# Patient Record
Sex: Male | Born: 1935
Health system: Southern US, Community
[De-identification: ages and names within clinical notes are randomized; demographics above are authoritative.]

## PROBLEM LIST (undated history)

## (undated) DIAGNOSIS — I509 Heart failure, unspecified: Secondary | ICD-10-CM

## (undated) DIAGNOSIS — K219 Gastro-esophageal reflux disease without esophagitis: Secondary | ICD-10-CM

## (undated) DIAGNOSIS — N200 Calculus of kidney: Secondary | ICD-10-CM

## (undated) DIAGNOSIS — Z87442 Personal history of urinary calculi: Secondary | ICD-10-CM

## (undated) DIAGNOSIS — N400 Enlarged prostate without lower urinary tract symptoms: Secondary | ICD-10-CM

## (undated) DIAGNOSIS — R011 Cardiac murmur, unspecified: Secondary | ICD-10-CM

## (undated) DIAGNOSIS — Z8669 Personal history of other diseases of the nervous system and sense organs: Secondary | ICD-10-CM

## (undated) DIAGNOSIS — I712 Thoracic aortic aneurysm, without rupture, unspecified: Secondary | ICD-10-CM

## (undated) DIAGNOSIS — R918 Other nonspecific abnormal finding of lung field: Secondary | ICD-10-CM

## (undated) DIAGNOSIS — I10 Essential (primary) hypertension: Secondary | ICD-10-CM

## (undated) DIAGNOSIS — T7840XA Allergy, unspecified, initial encounter: Secondary | ICD-10-CM

## (undated) DIAGNOSIS — I739 Peripheral vascular disease, unspecified: Secondary | ICD-10-CM

## (undated) DIAGNOSIS — I779 Disorder of arteries and arterioles, unspecified: Secondary | ICD-10-CM

## (undated) DIAGNOSIS — M1990 Unspecified osteoarthritis, unspecified site: Secondary | ICD-10-CM

## (undated) HISTORY — DX: Essential (primary) hypertension: I10

## (undated) HISTORY — PX: POLYPECTOMY: SHX149

## (undated) HISTORY — PX: INGUINAL HERNIA REPAIR: SUR1180

## (undated) HISTORY — PX: EXTRACORPOREAL SHOCK WAVE LITHOTRIPSY: SHX1557

## (undated) HISTORY — PX: COLONOSCOPY: SHX174

## (undated) HISTORY — DX: Allergy, unspecified, initial encounter: T78.40XA

## (undated) HISTORY — DX: Gastro-esophageal reflux disease without esophagitis: K21.9

## (undated) HISTORY — DX: Unspecified osteoarthritis, unspecified site: M19.90

---

## 2000-05-16 ENCOUNTER — Emergency Department (HOSPITAL_COMMUNITY): Admission: EM | Admit: 2000-05-16 | Discharge: 2000-05-16 | Payer: Self-pay | Admitting: Emergency Medicine

## 2000-05-17 ENCOUNTER — Encounter: Payer: Self-pay | Admitting: Emergency Medicine

## 2001-06-28 ENCOUNTER — Emergency Department (HOSPITAL_COMMUNITY): Admission: EM | Admit: 2001-06-28 | Discharge: 2001-06-28 | Payer: Self-pay | Admitting: Emergency Medicine

## 2003-07-08 ENCOUNTER — Encounter: Admission: RE | Admit: 2003-07-08 | Discharge: 2003-07-08 | Payer: Self-pay | Admitting: Family Medicine

## 2003-07-08 ENCOUNTER — Encounter: Payer: Self-pay | Admitting: Family Medicine

## 2004-10-14 ENCOUNTER — Ambulatory Visit: Payer: Self-pay | Admitting: Family Medicine

## 2004-11-13 ENCOUNTER — Ambulatory Visit: Payer: Self-pay | Admitting: Family Medicine

## 2005-04-20 ENCOUNTER — Ambulatory Visit: Payer: Self-pay | Admitting: Family Medicine

## 2005-04-29 ENCOUNTER — Ambulatory Visit: Payer: Self-pay | Admitting: Internal Medicine

## 2005-05-13 ENCOUNTER — Encounter (INDEPENDENT_AMBULATORY_CARE_PROVIDER_SITE_OTHER): Payer: Self-pay | Admitting: Specialist

## 2005-05-13 ENCOUNTER — Ambulatory Visit: Payer: Self-pay | Admitting: Internal Medicine

## 2005-11-15 HISTORY — PX: CATARACT EXTRACTION W/ INTRAOCULAR LENS  IMPLANT, BILATERAL: SHX1307

## 2006-05-03 ENCOUNTER — Ambulatory Visit: Payer: Self-pay | Admitting: Family Medicine

## 2006-05-03 LAB — CONVERTED CEMR LAB: PSA: 0.76 ng/mL

## 2006-06-08 ENCOUNTER — Ambulatory Visit: Payer: Self-pay | Admitting: Family Medicine

## 2007-03-21 ENCOUNTER — Encounter: Payer: Self-pay | Admitting: Family Medicine

## 2007-03-21 DIAGNOSIS — K219 Gastro-esophageal reflux disease without esophagitis: Secondary | ICD-10-CM | POA: Insufficient documentation

## 2007-03-21 DIAGNOSIS — J309 Allergic rhinitis, unspecified: Secondary | ICD-10-CM | POA: Insufficient documentation

## 2007-03-21 DIAGNOSIS — K469 Unspecified abdominal hernia without obstruction or gangrene: Secondary | ICD-10-CM | POA: Insufficient documentation

## 2007-03-21 DIAGNOSIS — I10 Essential (primary) hypertension: Secondary | ICD-10-CM | POA: Insufficient documentation

## 2007-03-21 DIAGNOSIS — G7000 Myasthenia gravis without (acute) exacerbation: Secondary | ICD-10-CM | POA: Insufficient documentation

## 2007-03-21 DIAGNOSIS — J45909 Unspecified asthma, uncomplicated: Secondary | ICD-10-CM | POA: Insufficient documentation

## 2007-03-21 DIAGNOSIS — G43809 Other migraine, not intractable, without status migrainosus: Secondary | ICD-10-CM | POA: Insufficient documentation

## 2007-05-10 ENCOUNTER — Ambulatory Visit: Payer: Self-pay | Admitting: Family Medicine

## 2007-05-10 LAB — CONVERTED CEMR LAB
ALT: 31 units/L (ref 0–53)
Alkaline Phosphatase: 59 units/L (ref 39–117)
BUN: 13 mg/dL (ref 6–23)
Bilirubin, Direct: 0.1 mg/dL (ref 0.0–0.3)
CO2: 34 meq/L — ABNORMAL HIGH (ref 19–32)
Calcium: 10.5 mg/dL (ref 8.4–10.5)
Cholesterol: 218 mg/dL (ref 0–200)
Direct LDL: 140.4 mg/dL
Eosinophils Absolute: 0.1 10*3/uL (ref 0.0–0.6)
GFR calc Af Amer: 85 mL/min
GFR calc non Af Amer: 71 mL/min
HDL: 42.9 mg/dL (ref >39.0)
Hemoglobin: 14 g/dL (ref 13.0–17.0)
Lymphocytes Relative: 29.2 % (ref 12.0–46.0)
MCHC: 34.1 g/dL (ref 30.0–36.0)
MCV: 88.4 fL (ref 78.0–100.0)
Monocytes Absolute: 0.4 10*3/uL (ref 0.2–0.7)
Monocytes Relative: 8.4 % (ref 3.0–11.0)
Neutro Abs: 3.3 10*3/uL (ref 1.4–7.7)
Platelets: 220 10*3/uL (ref 150–400)
Potassium: 4 meq/L (ref 3.5–5.1)
TSH: 1.62 microintl units/mL (ref 0.35–5.50)
Total Protein: 7.1 g/dL (ref 6.0–8.3)

## 2007-08-18 ENCOUNTER — Ambulatory Visit: Payer: Self-pay | Admitting: Family Medicine

## 2007-11-23 ENCOUNTER — Telehealth: Payer: Self-pay | Admitting: Family Medicine

## 2008-05-12 ENCOUNTER — Encounter: Payer: Self-pay | Admitting: Family Medicine

## 2008-05-13 ENCOUNTER — Ambulatory Visit: Payer: Self-pay | Admitting: Family Medicine

## 2008-05-13 LAB — CONVERTED CEMR LAB
AST: 32 units/L (ref 0–37)
Albumin: 4.1 g/dL (ref 3.5–5.2)
BUN: 16 mg/dL (ref 6–23)
Basophils Absolute: 0 10*3/uL (ref 0.0–0.1)
Basophils Relative: 0.5 % (ref 0.0–1.0)
Calcium: 10.3 mg/dL (ref 8.4–10.5)
Cholesterol: 181 mg/dL (ref 0–200)
Creatinine, Ser: 1 mg/dL (ref 0.4–1.5)
Eosinophils Absolute: 0.2 10*3/uL (ref 0.0–0.7)
Eosinophils Relative: 4.4 % (ref 0.0–5.0)
GFR calc Af Amer: 95 mL/min
GFR calc non Af Amer: 79 mL/min
HCT: 42.3 % (ref 39.0–52.0)
HDL: 37.4 mg/dL — ABNORMAL LOW (ref >39.0)
MCHC: 33.8 g/dL (ref 30.0–36.0)
MCV: 91.8 fL (ref 78.0–100.0)
Monocytes Absolute: 0.5 10*3/uL (ref 0.1–1.0)
PSA: 0.92 ng/mL (ref 0.10–4.00)
RBC: 4.6 M/uL (ref 4.22–5.81)
TSH: 1.47 microintl units/mL (ref 0.35–5.50)
Total Bilirubin: 0.8 mg/dL (ref 0.3–1.2)
VLDL: 17 mg/dL (ref 0–40)
WBC: 5.6 10*3/uL (ref 4.5–10.5)

## 2008-06-03 ENCOUNTER — Telehealth: Payer: Self-pay | Admitting: Internal Medicine

## 2008-06-17 ENCOUNTER — Ambulatory Visit (HOSPITAL_COMMUNITY): Admission: RE | Admit: 2008-06-17 | Discharge: 2008-06-17 | Payer: Self-pay | Admitting: Urology

## 2008-06-17 HISTORY — PX: OTHER SURGICAL HISTORY: SHX169

## 2008-08-28 ENCOUNTER — Ambulatory Visit: Payer: Self-pay | Admitting: Family Medicine

## 2008-11-12 ENCOUNTER — Telehealth: Payer: Self-pay | Admitting: Family Medicine

## 2008-11-20 ENCOUNTER — Ambulatory Visit: Payer: Self-pay | Admitting: Family Medicine

## 2008-11-20 DIAGNOSIS — M159 Polyosteoarthritis, unspecified: Secondary | ICD-10-CM | POA: Insufficient documentation

## 2009-01-17 ENCOUNTER — Telehealth: Payer: Self-pay | Admitting: Family Medicine

## 2009-04-15 ENCOUNTER — Telehealth: Payer: Self-pay | Admitting: Family Medicine

## 2009-05-13 ENCOUNTER — Ambulatory Visit: Payer: Self-pay | Admitting: Family Medicine

## 2009-05-13 LAB — CONVERTED CEMR LAB
AST: 32 units/L (ref 0–37)
BUN: 15 mg/dL (ref 6–23)
Bilirubin, Direct: 0.1 mg/dL (ref 0.0–0.3)
Cholesterol: 191 mg/dL (ref 0–200)
Eosinophils Absolute: 0.2 10*3/uL (ref 0.0–0.7)
Eosinophils Relative: 2.5 % (ref 0.0–5.0)
GFR calc non Af Amer: 84.71 mL/min (ref >60)
LDL Cholesterol: 127 mg/dL — ABNORMAL HIGH (ref 0–99)
MCHC: 34.1 g/dL (ref 30.0–36.0)
MCV: 92.1 fL (ref 78.0–100.0)
Monocytes Absolute: 0.6 10*3/uL (ref 0.1–1.0)
Neutrophils Relative %: 54.7 % (ref 43.0–77.0)
PSA: 1.18 ng/mL (ref 0.10–4.00)
Platelets: 210 10*3/uL (ref 150.0–400.0)
Potassium: 3.7 meq/L (ref 3.5–5.1)
Sodium: 144 meq/L (ref 135–145)
Total Bilirubin: 0.8 mg/dL (ref 0.3–1.2)
Total CHOL/HDL Ratio: 5
VLDL: 25.2 mg/dL (ref 0.0–40.0)
WBC: 6.7 10*3/uL (ref 4.5–10.5)

## 2009-05-15 ENCOUNTER — Encounter: Payer: Self-pay | Admitting: Family Medicine

## 2009-05-16 ENCOUNTER — Ambulatory Visit: Payer: Self-pay | Admitting: Family Medicine

## 2009-05-16 DIAGNOSIS — N529 Male erectile dysfunction, unspecified: Secondary | ICD-10-CM | POA: Insufficient documentation

## 2009-05-20 ENCOUNTER — Telehealth: Payer: Self-pay | Admitting: *Deleted

## 2009-06-03 ENCOUNTER — Ambulatory Visit: Payer: Self-pay | Admitting: Family Medicine

## 2009-09-08 ENCOUNTER — Ambulatory Visit: Payer: Self-pay | Admitting: Family Medicine

## 2009-10-29 ENCOUNTER — Encounter: Payer: Self-pay | Admitting: Family Medicine

## 2009-10-29 ENCOUNTER — Telehealth: Payer: Self-pay | Admitting: Family Medicine

## 2010-01-26 ENCOUNTER — Telehealth: Payer: Self-pay | Admitting: Family Medicine

## 2010-02-03 ENCOUNTER — Telehealth: Payer: Self-pay | Admitting: Family Medicine

## 2010-02-23 ENCOUNTER — Ambulatory Visit (HOSPITAL_COMMUNITY): Admission: RE | Admit: 2010-02-23 | Discharge: 2010-02-23 | Payer: Self-pay | Admitting: Urology

## 2010-03-02 ENCOUNTER — Telehealth: Payer: Self-pay | Admitting: Family Medicine

## 2010-04-01 ENCOUNTER — Telehealth: Payer: Self-pay | Admitting: Family Medicine

## 2010-04-28 ENCOUNTER — Encounter (INDEPENDENT_AMBULATORY_CARE_PROVIDER_SITE_OTHER): Payer: Self-pay | Admitting: *Deleted

## 2010-04-30 ENCOUNTER — Encounter (INDEPENDENT_AMBULATORY_CARE_PROVIDER_SITE_OTHER): Payer: Self-pay | Admitting: *Deleted

## 2010-05-04 ENCOUNTER — Encounter (INDEPENDENT_AMBULATORY_CARE_PROVIDER_SITE_OTHER): Payer: Self-pay | Admitting: *Deleted

## 2010-05-05 ENCOUNTER — Ambulatory Visit: Payer: Self-pay | Admitting: Internal Medicine

## 2010-05-06 ENCOUNTER — Telehealth: Payer: Self-pay | Admitting: Internal Medicine

## 2010-05-11 ENCOUNTER — Telehealth: Payer: Self-pay | Admitting: Internal Medicine

## 2010-05-14 ENCOUNTER — Ambulatory Visit: Payer: Self-pay | Admitting: Internal Medicine

## 2010-05-14 HISTORY — PX: COLONOSCOPY W/ POLYPECTOMY: SHX1380

## 2010-05-14 LAB — HM COLONOSCOPY

## 2010-05-26 ENCOUNTER — Ambulatory Visit: Payer: Self-pay | Admitting: Family Medicine

## 2010-06-23 ENCOUNTER — Telehealth: Payer: Self-pay | Admitting: Family Medicine

## 2010-08-19 ENCOUNTER — Telehealth (INDEPENDENT_AMBULATORY_CARE_PROVIDER_SITE_OTHER): Payer: Self-pay | Admitting: *Deleted

## 2010-08-20 ENCOUNTER — Ambulatory Visit: Payer: Self-pay | Admitting: Family Medicine

## 2010-08-21 ENCOUNTER — Ambulatory Visit: Payer: Self-pay | Admitting: Family Medicine

## 2010-08-21 LAB — CONVERTED CEMR LAB: Phosphorus: 2.6 mg/dL (ref 2.3–4.6)

## 2010-09-01 ENCOUNTER — Telehealth: Payer: Self-pay | Admitting: Family Medicine

## 2010-09-28 ENCOUNTER — Ambulatory Visit (HOSPITAL_COMMUNITY): Admission: RE | Admit: 2010-09-28 | Discharge: 2010-09-28 | Payer: Self-pay | Admitting: Urology

## 2010-10-21 ENCOUNTER — Telehealth: Payer: Self-pay | Admitting: Family Medicine

## 2010-10-23 ENCOUNTER — Ambulatory Visit: Payer: Self-pay | Admitting: Family Medicine

## 2010-12-13 LAB — CONVERTED CEMR LAB
AST: 31 units/L (ref 0–37)
Albumin: 4.2 g/dL (ref 3.5–5.2)
Basophils Relative: 0.5 % (ref 0.0–3.0)
Calcium: 10.9 mg/dL — ABNORMAL HIGH (ref 8.4–10.5)
Creatinine, Ser: 1.2 mg/dL (ref 0.4–1.5)
Eosinophils Absolute: 0.2 10*3/uL (ref 0.0–0.7)
Eosinophils Relative: 2.4 % (ref 0.0–5.0)
GFR calc non Af Amer: 78.66 mL/min (ref >60)
Glucose, Urine, Semiquant: NEGATIVE
HDL: 36.3 mg/dL — ABNORMAL LOW (ref >39.00)
Lymphocytes Relative: 28.6 % (ref 12.0–46.0)
MCHC: 34.4 g/dL (ref 30.0–36.0)
Neutrophils Relative %: 59.2 % (ref 43.0–77.0)
RBC: 4.58 M/uL (ref 4.22–5.81)
Specific Gravity, Urine: >=1.03
TSH: 1.1 microintl units/mL (ref 0.35–5.50)
Total CHOL/HDL Ratio: 5
Triglycerides: 173 mg/dL — ABNORMAL HIGH (ref 0.0–149.0)
WBC Urine, dipstick: NEGATIVE
WBC: 6.8 10*3/uL (ref 4.5–10.5)
pH: 5

## 2010-12-17 NOTE — Progress Notes (Signed)
Summary: Lab Appointment/ Flu shot  Phone Note Call from Patient   Complaint: Chest Pain Summary of Call: Pt was informed by Dr. Tawanna Cooler to call [facility] for apt to have Serum Calium test done in October. Call back on cell number [phone] Initial call taken by: Kathrynn Speed CMA,  August 19, 2010 10:38 AM  Follow-up for Phone Call        scheduled lab apt and will be getting flu shot after lab tests are done Follow-up by: Kathrynn Speed CMA,  August 19, 2010 11:24 AM

## 2010-12-17 NOTE — Progress Notes (Signed)
Summary: REFILL  Phone Note Refill Request Message from:  Fax from Pharmacy  Refills Requested: Medication #1:  HYDROCHLOROTHIAZIDE 25 MG TABS 1 tablet by mouth every morning [facility] FAX---[phone]  Initial call taken by: Warnell Forester,  January 26, 2010 9:44 AM    Prescriptions: HYDROCHLOROTHIAZIDE 25 MG TABS (HYDROCHLOROTHIAZIDE) 1 tablet by mouth every morning  #100 x 2   Entered by:   Kern Reap CMA (AAMA)   Authorized by:   Roderick Pee MD   Signed by:   Kern Reap CMA (AAMA) on 01/26/2010   Method used:   [facility]        [facility] [phone]* (retail)       [address]       [place], [place]  [address]       Ph: [phone] or [phone]       Fax: [phone]   RxID:   [id number]

## 2010-12-17 NOTE — Procedures (Signed)
Summary: Colonoscopy  Patient: [name] Note: All result statuses are Final unless otherwise noted.  Tests: (1) Colonoscopy (COL)   COL Colonoscopy           DONE     [facility]     [address]     [place], [place]  [address]           COLONOSCOPY PROCEDURE REPORT           PATIENT:  [name], [name]  MR#:  [id number]     BIRTHDATE:  1935/05/23, 74 yrs. old  GENDER:  male     ENDOSCOPIST:  Wilhemina Bonito. Eda Keys, MD     REF. BY:  Surveillance Program Recall,     PROCEDURE DATE:  05/14/2010     PROCEDURE:  Colonoscopy with snare polypectomy x 1     ASA CLASS:  Class II     INDICATIONS:  history of pre-cancerous (adenomatous) colon polyps     ; index 04-2005 w/ small adenomas     MEDICATIONS:   Fentanyl 50 IV, Versed 7 mg IV           DESCRIPTION OF PROCEDURE:   After the risks benefits and     alternatives of the procedure were thoroughly explained, informed     consent was obtained.  Digital rectal exam was performed and     revealed no abnormalities.   The [id number] endoscope was     introduced through the anus and advanced to the cecum, which was     identified by both the appendix and ileocecal valve, without     limitations. Time to cecum = 7:38 min. The quality of the prep was     excellent, using MoviPrep.  The instrument was then slowly     withdrawn (time = 9:59 min) as the colon was fully examined.     <<PROCEDUREIMAGES>>           FINDINGS:  A diminutive polyp was found in the ascending colon.     Polyp was snared without cautery. Retrieval was successful. snare     polyp  This was otherwise a normal examination of the colon.     Retroflexed views in the rectum revealed internal hemorrhoids.     The scope was then withdrawn from the patient and the procedure     completed.           COMPLICATIONS:  None     ENDOSCOPIC IMPRESSION:     1) Diminutive polyp in the ascending colon - removed     2) Otherwise normal examination     3) Internal  hemorrhoids           RECOMMENDATIONS:     1) Follow up colonoscopy in 5 years     ______________________________     Wilhemina Bonito. Eda Keys, MD           CC:  The Patient; Roderick Pee, MD           n.     Rosalie DoctorWilhemina Bonito. Eda Keys at 05/14/2010 12:43 PM           Abran Cantor, [phone]  Note: An exclamation mark (!) indicates a result that was not dispersed into the flowsheet. Document Creation Date: 05/14/2010 12:44 PM _______________________________________________________________________  (1) Order result status: Final Collection or observation date-time: 05/14/2010 12:38 Requested date-time:  Receipt date-time:  Reported date-time:  Referring Physician:   Ordering Physician: Jonny Ruiz  Eda Keys [phone]) Specimen Source:  Source: Launa Grill Order Number: [id number] Lab site:

## 2010-12-17 NOTE — Progress Notes (Signed)
Summary: singular refill  Phone Note Refill Request Message from:  Fax from Pharmacy on March 02, 2010 12:24 PM  Refills Requested: Medication #1:  SINGULAIR 10 MG TABS Take 1 tablet by mouth once a day Initial call taken by: Kern Reap CMA Duncan Dull),  March 02, 2010 12:24 PM    Prescriptions: SINGULAIR 10 MG TABS (MONTELUKAST SODIUM) Take 1 tablet by mouth once a day  #100 x 1   Entered by:   Kern Reap CMA (AAMA)   Authorized by:   Roderick Pee MD   Signed by:   Kern Reap CMA (AAMA) on 03/02/2010   Method used:   Electronically to        [facility] (mail-order)             ,          Ph: [phone]       Fax: [phone]   RxID:   [id number]

## 2010-12-17 NOTE — Progress Notes (Signed)
Summary: triage / loose stools / upcoming colonoscopy   Phone Note Call from Patient Call back at Home Phone [phone]   Caller: Patient Call For: Marina Goodell Reason for Call: Talk to Nurse Summary of Call: Patient would like to speak to nurse because he is scheduled for a procedue on Thurs. but is having some stomach pains , wants to know if he can still have procedure or does he need to see Dr Marina Goodell first. Initial call taken by: Tawni Levy,  May 11, 2010 8:47 AM  Follow-up for Phone Call        Pt started having intermittent cramping with loose stools on Saturday evening.Bowel movements x4 on Sat,x2 Sunday and 1 so far today.No blood or mucus seen. No chills,fever or other G.I. symptoms.Has happened a few times before.Wants to know if he needs seen or go ahead with colon as scheduled this week? Follow-up by: Teryl Lucy RN,  May 11, 2010 9:09 AM  Additional Follow-up for Phone Call Additional follow up Details #1::        go ahead with colonoscopy as planned. Also, pull his chart for my review. I don't see old GI records in the EMR Additional Follow-up by: Hilarie Fredrickson MD,  May 11, 2010 9:21 AM    Additional Follow-up for Phone Call Additional follow up Details #2::    Pt. ntfd. of Dr.Perry's response to keep colon appt. Follow-up by: Teryl Lucy RN,  May 11, 2010 9:37 AM

## 2010-12-17 NOTE — Miscellaneous (Signed)
Summary: previsit.RM  Clinical Lists Changes  Medications: Added new medication of MOVIPREP 100 GM  SOLR (PEG-KCL-NACL-NASULF-NA ASC-C) As per prep instructions. - Signed Rx of MOVIPREP 100 GM  SOLR (PEG-KCL-NACL-NASULF-NA ASC-C) As per prep instructions.;  #1 x 0;  Signed;  Entered by: Sherren Kerns RN;  Authorized by: Hilarie Fredrickson MD;  Method used: [facility] [phone] [address], [place], [place]  [address], Ph: [phone] or [phone], Fax: [phone] Observations: Added new observation of ALLERGY REV: Done (05/05/2010 13:45)    Prescriptions: MOVIPREP 100 GM  SOLR (PEG-KCL-NACL-NASULF-NA ASC-C) As per prep instructions.  #1 x 0   Entered by:   Sherren Kerns RN   Authorized by:   Hilarie Fredrickson MD   Signed by:   Sherren Kerns RN on 05/05/2010   Method used:   [facility] to        [facility] [phone][facility] (retail)       [address]       [place], [place]  [address]       Ph: [phone] or [phone]       Fax: [phone]   RxID:   [id number]

## 2010-12-17 NOTE — Progress Notes (Signed)
Summary: labs scheduled  Phone Note Call from Patient Call back at Home Phone [phone]   Caller: Patient Call For: Roderick Pee MD Reason for Call: Talk to Nurse Summary of Call: patient is calling to schedule lab work. Initial call taken by: Kern Reap CMA Duncan Dull),  October 21, 2010 1:47 PM  Follow-up for Phone Call        Called pt and appt for labs (Calcium, PTH level and a serum phosphorus) was scheduled for 10/23/10.  Follow-up by: Debbra Riding,  October 21, 2010 2:01 PM

## 2010-12-17 NOTE — Progress Notes (Signed)
Summary: refill  Phone Note Call from Patient Call back at Home Phone [phone]   Caller: Patient----live call Reason for Call: Refill Medication Summary of Call: Please send his rx for HCTZ to [facility]. Initial call taken by: Warnell Forester,  February 03, 2010 9:55 AM    Prescriptions: HYDROCHLOROTHIAZIDE 25 MG TABS (HYDROCHLOROTHIAZIDE) 1 tablet by mouth every morning  #100 x 2   Entered by:   Kern Reap CMA (AAMA)   Authorized by:   Roderick Pee MD   Signed by:   Kern Reap CMA (AAMA) on 02/03/2010   Method used:   Electronically to        [facility]* (mail-order)             ,          Ph: [phone]       Fax: [phone]   RxID:   [id number]

## 2010-12-17 NOTE — Letter (Signed)
Summary: [facility] Instructions  [facility]  [address] [place], [place] [address]   Phone: [phone]  Fax: [phone]       [name]    March 09, 1935    MRN: [id number]        Procedure Day /Date:  05/14/10  Thursday     Arrival Time:  10:30am      Procedure Time:  11:30am     Location of Procedure:                    _x _  [facility] (4th Floor)                        PREPARATION FOR COLONOSCOPY WITH MOVIPREP   Starting 5 days prior to your procedure _6/25/11 _ do not eat nuts, seeds, popcorn, corn, beans, peas,  salads, or any raw vegetables.  Do not take any fiber supplements (e.g. Metamucil, Citrucel, and Benefiber).  THE DAY BEFORE YOUR PROCEDURE         DATE:  05/13/10  DAY:  Wednesday  1.  Drink clear liquids the entire day-NO SOLID FOOD  2.  Do not drink anything colored red or purple.  Avoid juices with pulp.  No orange juice.  3.  Drink at least 64 oz. (8 glasses) of fluid/clear liquids during the day to prevent dehydration and help the prep work efficiently.  CLEAR LIQUIDS INCLUDE: Water Jello Ice Popsicles Tea (sugar ok, no milk/cream) Powdered fruit flavored drinks Coffee (sugar ok, no milk/cream) Gatorade Juice: apple, white grape, white cranberry  Lemonade Clear bullion, consomm, broth Carbonated beverages (any kind) Strained chicken noodle soup Hard Candy                             4.  In the morning, mix first dose of MoviPrep solution:    Empty 1 Pouch A and 1 Pouch B into the disposable container    Add lukewarm drinking water to the top line of the container. Mix to dissolve    Refrigerate (mixed solution should be used within 24 hrs)  5.  Begin drinking the prep at 5:00 p.m. The MoviPrep container is divided by 4 marks.   Every 15 minutes drink the solution down to the next mark (approximately 8 oz) until the full liter is complete.   6.  Follow completed prep with 16 oz of clear liquid of your  choice (Nothing red or purple).  Continue to drink clear liquids until bedtime.  7.  Before going to bed, mix second dose of MoviPrep solution:    Empty 1 Pouch A and 1 Pouch B into the disposable container    Add lukewarm drinking water to the top line of the container. Mix to dissolve    Refrigerate  THE DAY OF YOUR PROCEDURE      DATE:  05/14/10 DAY:  Thursday  Beginning at  6:30 a.m. (5 hours before procedure):         1. Every 15 minutes, drink the solution down to the next mark (approx 8 oz) until the full liter is complete.  2. Follow completed prep with 16 oz. of clear liquid of your choice.    3. You may drink clear liquids until 9:30am   (2 HOURS BEFORE PROCEDURE).   MEDICATION INSTRUCTIONS  Unless otherwise instructed, you should take regular prescription medications with a small sip of  water   as early as possible the morning of your procedure.   Additional medication instructions:  hold HCTZ morning of procedure!         OTHER INSTRUCTIONS  You will need a responsible adult at least 74 years of age to accompany you and drive you home.   This person must remain in the waiting room during your procedure.  Wear loose fitting clothing that is easily removed.  Leave jewelry and other valuables at home.  However, you may wish to bring a book to read or  an iPod/MP3 player to listen to music as you wait for your procedure to start.  Remove all body piercing jewelry and leave at home.  Total time from sign-in until discharge is approximately 2-3 hours.  You should go home directly after your procedure and rest.  You can resume normal activities the  day after your procedure.  The day of your procedure you should not:   Drive   Make legal decisions   Operate machinery   Drink alcohol   Return to work  You will receive specific instructions about eating, activities and medications before you leave.    The above instructions have been reviewed and  explained to me by  Sherren Kerns RN  May 05, 2010 2:38 PM     I fully understand and can verbalize these instructions _____________________________ Date _________

## 2010-12-17 NOTE — Assessment & Plan Note (Signed)
Summary: emp-will fast//ccm   Vital Signs:  Patient profile:   76 year old male Height:      63.75 inches Weight:      164 pounds BMI:     28.47 Temp:     98.4 degrees F oral BP sitting:   160 / 90  (left arm) Cuff size:   regular  Vitals Entered By: Kern Reap CMA Duncan Dull) (May 26, 2010 8:26 AM) CC: cpx   CC:  cpx.  History of Present Illness: [name] is a 76 year old, married male, nonsmoker, who comes in today for annual physical examination because of underlying asthma myasthenia gravis, hypertension.  His asthma history with Flovent 44, 2 puffs b.i.d., and albuterol p.r.n. and Singulair 10 mg daily.  He's had a good year.  No major asthma attacks.   Zestril 20 mg daily, hydrochlorothiazide 25 mg..... BP at home.  This morning, 127/73.  His myasthenia is quiet.  We did write him some Viagra per ED however, his wife is not sexually interested anymore  He gets routine eye care..bilateral cataracts, and lens implants, Dr. Vonna Kotyk...Marland KitchenMarland KitchenMarland Kitchen  Dental care.  Colonoscopy done a couple months ago, normal, tetanus, 2009, flu, 2010, Pneumovax 2006, shingles 2010. Here for Medicare AWV:  1.   Risk factors based on Past M, S, F history:........no change 2.   Physical Activities: .........Marland Kitchenwalks daily 3.   Depression/mood: ........mood good.  No depression 4.   Hearing: ..........normal 5.   ADL's: ........normal 6.   Fall Risk: ...Marland KitchenMarland KitchenMarland Kitchenreviewed 7.   Home Safety: .........Marland Kitchenreviewed 8.   Height, weight, &visual acuity:..height weight, normal annual eye exam by ophthalmologist 9.   Counseling:...Marland KitchenMarland KitchenAllergies:  problems, stable  10.   Labs ordered based on risk factors: ...labs done 11.           Referral Coordination.none 12.           Care Plan.......Marland Kitchenoverall plan reviewed 13.            Cognitive Assessment ........normal mentation  Allergies (verified): No Known Drug Allergies  Past History:  Past medical, surgical, family and social histories (including risk factors) reviewed, and  no changes noted (except as noted below).  Past Medical History: Reviewed history from 11/20/2008 and no changes required. Allergic rhinitis Asthma GERD Hypertension myasthenia gravis bilateral cataracts with lens implants kidney stones bilateral hernia repair Osteoarthritis  Family History: Reviewed history from 05/13/2008 and no changes required. father died 41, congestive heart failure, hypertension mother died 66, congestive heart failure, hypertension, chronic renal failure one brother has cardiomegaly 4 sisters, one died of a congenital cardiac defect, when his hypertension, renal artery stenosis.  The other to also have hypertension  Social History: Reviewed history from 05/13/2008 and no changes required. Retired Married Never Smoked Alcohol use-no Drug use-no Regular exercise-yes  Review of Systems      See HPI  Physical Exam  General:  Well-developed,well-nourished,in no acute distress; alert,appropriate and cooperative throughout examination Head:  Normocephalic and atraumatic without obvious abnormalities. No apparent alopecia or balding. Eyes:  No corneal or conjunctival inflammation noted. EOMI. Perrla. Funduscopic exam benign, without hemorrhages, exudates or papilledema. Vision grossly normal. Ears:  External ear exam shows no significant lesions or deformities.  Otoscopic examination reveals clear canals, tympanic membranes are intact bilaterally without bulging, retraction, inflammation or discharge. Hearing is grossly normal bilaterally. Nose:  External nasal examination shows no deformity or inflammation. Nasal mucosa are pink and moist without lesions or exudates. Mouth:  Oral mucosa and oropharynx without lesions or exudates.  Teeth in good repair. Neck:  No deformities, masses, or tenderness noted. Chest Wall:  No deformities, masses, tenderness or gynecomastia noted. Breasts:  No masses or gynecomastia noted Lungs:  Normal respiratory effort, chest  expands symmetrically. Lungs are clear to auscultation, no crackles or wheezes. Heart:  Normal rate and regular rhythm. S1 and S2 normal without gallop, murmur, click, rub or other extra sounds. Abdomen:  Bowel sounds positive,abdomen soft and non-tender without masses, organomegaly or hernias noted. Rectal:  No external abnormalities noted. Normal sphincter tone. No rectal masses or tenderness. Genitalia:  Testes bilaterally descended without nodularity, tenderness or masses. No scrotal masses or lesions. No penis lesions or urethral discharge. Prostate:  Prostate gland firm and smooth, no enlargement, nodularity, tenderness, mass, asymmetry or induration. Msk:  No deformity or scoliosis noted of thoracic or lumbar spine.   Pulses:  R and L carotid,radial,femoral,dorsalis pedis and posterior tibial pulses are full and equal bilaterally Extremities:  No clubbing, cyanosis, edema, or deformity noted with normal full range of motion of all joints.   Neurologic:  No cranial nerve deficits noted. Station and gait are normal. Plantar reflexes are down-going bilaterally. DTRs are symmetrical throughout. Sensory, motor and coordinative functions appear intact. Skin:  Intact without suspicious lesions or rashes Cervical Nodes:  No lymphadenopathy noted Axillary Nodes:  No palpable lymphadenopathy Inguinal Nodes:  No significant adenopathy Psych:  Cognition and judgment appear intact. Alert and cooperative with normal attention span and concentration. No apparent delusions, illusions, hallucinations   Impression & Recommendations:  Problem # 1:  MYASTHENIA GRAVIS W/O (ACUTE) EXACERBATION (ICD-358.00) Assessment Unchanged  Orders: Venipuncture (56387) Prescription Created Electronically [phone]) First annual wellness visit with prevention plan  (I9518) UA Dipstick w/o Micro (automated)  (81003) TLB-BMP (Basic Metabolic Panel-BMET) (80048-METABOL) TLB-CBC Platelet - w/Differential  (85025-CBCD) TLB-Lipid Panel (80061-LIPID) TLB-Hepatic/Liver Function Pnl (80076-HEPATIC) TLB-TSH (Thyroid Stimulating Hormone) (84443-TSH) TLB-PSA (Prostate Specific Antigen) (84153-PSA)  Problem # 2:  HYPERTENSION (ICD-401.9) Assessment: Improved  His updated medication list for this problem includes:    Hydrochlorothiazide 25 Mg Tabs (Hydrochlorothiazide) .Marland Kitchen... 1 tablet by mouth every morning    Zestril 20 Mg Tabs (Lisinopril) .Marland Kitchen... Take 1 tablet by mouth every morning  Orders: Venipuncture (84166) Prescription Created Electronically [phone]) First annual wellness visit with prevention plan  (S0109) UA Dipstick w/o Micro (automated)  (81003) EKG w/ Interpretation (93000) TLB-BMP (Basic Metabolic Panel-BMET) (80048-METABOL) TLB-CBC Platelet - w/Differential (85025-CBCD) TLB-Lipid Panel (80061-LIPID) TLB-Hepatic/Liver Function Pnl (80076-HEPATIC) TLB-TSH (Thyroid Stimulating Hormone) (84443-TSH) TLB-PSA (Prostate Specific Antigen) (84153-PSA)  Problem # 3:  ASTHMA (ICD-493.90) Assessment: Improved  His updated medication list for this problem includes:    Albuterol 90 Mcg/act Aers (Albuterol) .Marland Kitchen... 2 ps qid as needed    Flovent Hfa 44 Mcg/act Aero (Fluticasone propionate  hfa) ..... Inhale 2 puff twice a day    Zafirlukast 10 Mg Tabs (Zafirlukast) ..... One daily  Orders: Venipuncture (32355) Prescription Created Electronically [phone]) First annual wellness visit with prevention plan  (U5427) UA Dipstick w/o Micro (automated)  (81003) TLB-BMP (Basic Metabolic Panel-BMET) (80048-METABOL) TLB-CBC Platelet - w/Differential (85025-CBCD) TLB-Lipid Panel (80061-LIPID) TLB-Hepatic/Liver Function Pnl (80076-HEPATIC) TLB-TSH (Thyroid Stimulating Hormone) (84443-TSH) TLB-PSA (Prostate Specific Antigen) (84153-PSA)  Problem # 4:  ALLERGIC RHINITIS (ICD-477.9) Assessment: Improved  Orders: Venipuncture (06237) Prescription Created Electronically [phone]) First annual wellness  visit with prevention plan  (D1761) UA Dipstick w/o Micro (automated)  (81003) TLB-BMP (Basic Metabolic Panel-BMET) (80048-METABOL) TLB-CBC Platelet - w/Differential (85025-CBCD) TLB-Lipid Panel (80061-LIPID) TLB-Hepatic/Liver Function Pnl (80076-HEPATIC) TLB-TSH (Thyroid Stimulating Hormone) (84443-TSH)  TLB-PSA (Prostate Specific Antigen) (84153-PSA)  Complete Medication List: 1)  Albuterol 90 Mcg/act Aers (Albuterol) .... 2 ps qid as needed 2)  Flovent Hfa 44 Mcg/act Aero (Fluticasone propionate  hfa) .... Inhale 2 puff twice a day 3)  Hydrochlorothiazide 25 Mg Tabs (Hydrochlorothiazide) .Marland Kitchen.. 1 tablet by mouth every morning 4)  Zafirlukast 10 Mg Tabs (Zafirlukast) .... One daily 5)  Adult Aspirin Ec Low Strength 81 Mg Tbec (Aspirin) .... Once daily 6)  Fish Oil 1200 Mg Caps (Omega-3 fatty acids) .... Once daily 7)  [facility] Ibuprofen 200 Mg Caps (Ibuprofen) .... Two times a day 8)  Multivitamins Tabs (Multiple vitamin) .... Once daily 9)  Zestril 20 Mg Tabs (Lisinopril) .... Take 1 tablet by mouth every morning  Patient Instructions: 1)  continue your current treatment program 2)  Please schedule a follow-up appointment in 1 year. Prescriptions: ZESTRIL 20 MG TABS (LISINOPRIL) Take 1 tablet by mouth every morning  #100 x 3   Entered and Authorized by:   Roderick Pee MD   Signed by:   Roderick Pee MD on 05/26/2010   Method used:   Faxed to ...       [facility] (mail-order)             , [place]         Ph: [phone]       Fax: [phone]   RxID:   [id number] ZAFIRLUKAST 10 MG TABS (ZAFIRLUKAST) One daily  #100 x 3   Entered and Authorized by:   Roderick Pee MD   Signed by:   Roderick Pee MD on 05/26/2010   Method used:   Faxed to ...       [facility] (mail-order)             , [place]         Ph: [phone]       Fax: [phone]   RxID:   [id number] HYDROCHLOROTHIAZIDE 25 MG TABS (HYDROCHLOROTHIAZIDE) 1 tablet by mouth every morning  #100 x 3   Entered and Authorized  by:   Roderick Pee MD   Signed by:   Roderick Pee MD on 05/26/2010   Method used:   Faxed to ...       [facility] (mail-order)             , [place]         Ph: [phone]       Fax: [phone]   RxID:   [id number] FLOVENT HFA 44 MCG/ACT AERO (FLUTICASONE PROPIONATE  HFA) Inhale 2 puff twice a day  #3 x 4   Entered and Authorized by:   Roderick Pee MD   Signed by:   Roderick Pee MD on 05/26/2010   Method used:   Faxed to ...       [facility] (mail-order)             , [place]         Ph: [phone]       Fax: [phone]   RxID:   [id number] ALBUTEROL 90 MCG/ACT AERS (ALBUTEROL) 2 ps qid as needed  #2 x 2   Entered and Authorized by:   Roderick Pee MD   Signed by:   Roderick Pee MD on 05/26/2010   Method used:   Faxed to ...       [facility] (mail-order)             ,  [place]         Ph: [phone]       Fax: [phone]   RxID:   [id number]     Laboratory Results   Urine Tests    Routine Urinalysis   Color: yellow Appearance: Clear Glucose: negative   (Normal Range: Negative) Bilirubin: negative   (Normal Range: Negative) Ketone: trace (5)   (Normal Range: Negative) Spec. Gravity: >=1.030   (Normal Range: 1.003-1.035) Blood: trace-lysed   (Normal Range: Negative) pH: 5.0   (Normal Range: 5.0-8.0) Protein: negative   (Normal Range: Negative) Urobilinogen: 0.2   (Normal Range: 0-1) Nitrite: negative   (Normal Range: Negative) Leukocyte Esterace: negative   (Normal Range: Negative)    Comments: Rita Ohara  May 26, 2010 10:35 AM

## 2010-12-17 NOTE — Progress Notes (Signed)
Summary: Moviprep too expensive   Phone Note Call from Patient Call back at [phone]   Caller: Patient Call For: Dr. Marina Goodell Reason for Call: Talk to Nurse Summary of Call: Moviprep is too expensive...would like an alternative med. Initial call taken by: Karna Christmas,  May 06, 2010 11:43 AM  Follow-up for Phone Call        Moviprep rebate coupon sent to pt. Follow-up by: Wyona Almas RN,  May 06, 2010 12:20 PM

## 2010-12-17 NOTE — Progress Notes (Signed)
Summary: zafirlukast request  Phone Note Call from Patient   Summary of Call: Insurance has suggested he try zafirlukast instead of singulair to save money.  He requests it be called to [facility].  He has CPX Aug.   Initial call taken by: Rudy Jew, RN,  Apr 01, 2010 8:23 AM  Follow-up for Phone Call        ok.Marland KitchenMarland KitchenMarland Kitchen#100 directions one daily refills x 3 Follow-up by: Roderick Pee MD,  Apr 01, 2010 9:20 AM    New/Updated Medications: ZAFIRLUKAST 10 MG TABS (ZAFIRLUKAST) One daily Prescriptions: ZAFIRLUKAST 10 MG TABS (ZAFIRLUKAST) One daily  #100 x 3   Entered by:   Rudy Jew, RN   Authorized by:   Roderick Pee MD   Signed by:   Rudy Jew, RN on 04/01/2010   Method used:   Electronically to        [facility] [phone]* (retail)       [address]       [place], [place]  [address]       Ph: [phone] or [phone]       Fax: [phone]   RxID:   [id number]

## 2010-12-17 NOTE — Letter (Signed)
Summary: Previsit letter  [facility]  [address] [place], [place] [address]   Phone: [phone]  Fax: [phone]       04/30/2010 MRN: [id number]  [name] [address] [place], [place]  [address]  Dear [name],  Welcome to the Gastroenterology Division at [facility].    You are scheduled to see a nurse for your pre-procedure visit on 05-05-10 at 2:00p.m. on the 3rd floor at [facility] [address]  We ask that you try to arrive at our office 15 minutes prior to your appointment time to allow for check-in.  Your nurse visit will consist of discussing your medical and surgical history, your immediate family medical history, and your medications.    Please bring a complete list of all your medications or, if you prefer, bring the medication bottles and we will list them.  We will need to be aware of both prescribed and over the counter drugs.  We will need to know exact dosage information as well.  If you are on blood thinners (Coumadin, Plavix, Aggrenox, Ticlid, etc.) please call our office today/prior to your appointment, as we need to consult with your physician about holding your medication.   Please be prepared to read and sign documents such as consent forms, a financial agreement, and acknowledgement forms.  If necessary, and with your consent, a friend or relative is welcome to sit-in on the nurse visit with you.  Please bring your insurance card so that we may make a copy of it.  If your insurance requires a referral to see a specialist, please bring your referral form from your primary care physician.  No co-pay is required for this nurse visit.     If you cannot keep your appointment, please call [phone] to cancel or reschedule prior to your appointment date.  This allows [place] the opportunity to schedule an appointment for another patient in need of care.    Thank you for choosing [facility] for your medical  needs.  We appreciate the opportunity to care for you.  Please visit [place] at our website  to learn more about our practice.                     Sincerely.                                                                                                                   The Gastroenterology Division

## 2010-12-17 NOTE — Letter (Signed)
Summary: Colonoscopy Letter  [facility]  [address] [place], [place] [address]   Phone: [phone]  Fax: [phone]      April 28, 2010 MRN: [id number]   [name] [address] [place], [place]  [address]   Dear [name],   According to your medical record, it is time for you to schedule a Colonoscopy. The American Cancer Society recommends this procedure as a method to detect early colon cancer. Patients with a family history of colon cancer, or a personal history of colon polyps or inflammatory bowel disease are at increased risk.  This letter has been generated based on the recommendations made at the time of your procedure. If you feel that in your particular situation this may no longer apply, please contact our office.  Please call our office at [phone] to schedule this appointment or to update your records at your earliest convenience.  Thank you for cooperating with [place] to provide you with the very best care possible.   Sincerely,  Wilhemina Bonito. Marina Goodell, M.D.  [facility] Division [phone]

## 2010-12-17 NOTE — Progress Notes (Signed)
Summary: requesting lab results.  Phone Note Call from Patient Call back at Home Phone [phone]   Caller: [facility] mail Reason for Call: Lab or Test Results Summary of Call: requesting lab results.,,,,,,,,,,,,,,,I called ahead and reviewed his data.  Phosphorus and PTH level normal.  Calcium slightly elevated 10.6.  Recommended follow-up of the above 3 in two months.  Nonfasting.  Patient will call Rachael to get this set up in December Initial call taken by: Warnell Forester,  September 01, 2010 9:50 AM

## 2010-12-17 NOTE — Progress Notes (Signed)
Summary: med question  Phone Note Call from Patient   Caller: Patient Call For: Roderick Pee MD Summary of Call: Pt is asking if Zafirlukast can cause cloudiness of eyes???  States Dr. Tawanna Cooler noticed this at the office visit. [phone] Initial call taken by: Lynann Beaver CMA,  June 23, 2010 10:15 AM  Follow-up for Phone Call        not sure I would ask him to call his ophthalmologist to see if that could be related Follow-up by: Roderick Pee MD,  June 23, 2010 10:59 AM  Additional Follow-up for Phone Call Additional follow up Details #1::        Pt. advised. Additional Follow-up by: Lynann Beaver CMA,  June 23, 2010 11:22 AM

## 2011-03-30 NOTE — Op Note (Signed)
NAME:  [name], [name]            ACCOUNT NO.:  [id number]   MEDICAL RECORD NO.:  [id number]          PATIENT TYPE:  AMB   LOCATION:  DAY                          FACILITY:  [facility]   PHYSICIAN:  Mark C. Vernie Ammons, M.D.  DATE OF BIRTH:  August 12, 1935   DATE OF PROCEDURE:  06/17/2008  DATE OF DISCHARGE:                               OPERATIVE REPORT   PREOPERATIVE DIAGNOSES:  1. Left ureteropelvic junction stone.  2. Possible left distal ureteral calculus.   POSTOPERATIVE DIAGNOSIS:  Left ureteropelvic junction stone.   PROCEDURE:  1. Cystoscopy.  2. Ureteral catheterization for retrograde pyelogram.  3. Left retrograde pyelogram with interpretation.  4. Left ureteroscopy.  5. Left double-J stent placement.   SURGEON:  Ihor Gully, M.D.   ANESTHESIA:  General.   BLOOD LOSS:  Minimal.   SPECIMENS:  None.   DRAINS:  A 4.8 French, 24 cm stent in the left ureter (no string).   COMPLICATIONS:  None.   INDICATIONS:  The patient is a 76 year old black male who had been  having some left lower quadrant pain.  I obtained a CT scan that  revealed the left UPJ stone that was fairly large and had minimal  probability of passage.  He had multiple calcifications in the pelvis  and although he had no hydronephrosis, I thought one of the  calcifications might have been a distal ureteral stone.  Because I felt  a stent placement was indicated for his lithotripsy of his UPJ stone, I  told him I would also evaluate him for a distal ureteral stone with  ureteroscopy.  The risks, complications, alternatives and limitations  were discussed.  He understands and elected to proceed.   DESCRIPTION OF OPERATION:  After informed consent, the patient was  brought to the major OR, placed on the table, administered general  anesthesia then moved to the dorsal lithotomy position.  Genitalia was  sterilely prepped and draped and an official time-out was then  performed.  The 22-French cystoscope was then  passed per urethra with  the 12 degree lens under direct visualization and I note urethra is  entirely normal without lesion or stricture.  The prostatic urethra  revealed mild bilobar hypertrophy, but no lesions and the bladder was  then entered.  The bladder was fully inspected in a systematic fashion  and found to be free of any tumor, stones or inflammatory lesions.   The left ureteral orifice was identified and a 6-French open-ended  ureteral catheter was then used to cannulate the left orifice.   A left retrograde pyelogram was then performed by injecting full  strength contrast under fluoroscopy up the left ureter and I noted what  appeared to be a possible filling defect in the distal left ureter and  minimal dilatation of the proximal ureter.  No filling defects  proximally until the UPJ was reached where a stone was seen as a filling  defect.  There was some dilation of the collecting system and renal  pelvis.  No mass effect was noted.   A 0.038 inch floppy tip guidewire was then passed up the left ureter  through the open-ended catheter into the area in the renal pelvis under  direct fluoroscopy and the inner portion of the ureteral access sheath  was used to gently dilate the distal left ureteral orifice and  intramural ureter.  I then passed the 6-French rigid ureteroscope  directly down the urethra and into the left orifice.  Just inside the  orifice in the intramural ureteral region there appeared to be some  edema, but no stone was identified.  I passed the scope to nearly the  UPJ, but no stone in the ureter.  I therefore removed the scope under  direct visualization and again noted no stone, but the edema at the UPJ  indicating there may have been a possible distal stone there previously  that had passed.   I left the guidewire in place and back loaded the cystoscope over the  guidewire and passed the double-J stent over the guidewire with good  curl being noted in  the renal pelvis and in the bladder.  The bladder  was drained and the cystoscope was removed.  The patient was awakened  and taken to recovery room in stable satisfactory condition.  He  tolerated the procedure well.  There were no intraoperative  complications.   He is scheduled for lithotripsy of his left UPJ stone later today.  He  will be discharged home after that.      Mark C. Vernie Ammons, M.D.  Electronically Signed     MCO/MEDQ  D:  06/17/2008  T:  06/17/2008  Job:  [id number]

## 2011-05-27 ENCOUNTER — Encounter: Payer: Self-pay | Admitting: Family Medicine

## 2011-05-31 ENCOUNTER — Ambulatory Visit (INDEPENDENT_AMBULATORY_CARE_PROVIDER_SITE_OTHER): Payer: Medicare Other | Admitting: Family Medicine

## 2011-05-31 ENCOUNTER — Encounter: Payer: Self-pay | Admitting: Family Medicine

## 2011-05-31 DIAGNOSIS — Z136 Encounter for screening for cardiovascular disorders: Secondary | ICD-10-CM

## 2011-05-31 DIAGNOSIS — G7000 Myasthenia gravis without (acute) exacerbation: Secondary | ICD-10-CM

## 2011-05-31 DIAGNOSIS — Z0000 Encounter for general adult medical examination without abnormal findings: Secondary | ICD-10-CM

## 2011-05-31 DIAGNOSIS — I10 Essential (primary) hypertension: Secondary | ICD-10-CM

## 2011-05-31 DIAGNOSIS — J45909 Unspecified asthma, uncomplicated: Secondary | ICD-10-CM

## 2011-05-31 DIAGNOSIS — Z125 Encounter for screening for malignant neoplasm of prostate: Secondary | ICD-10-CM

## 2011-05-31 LAB — LIPID PANEL
Cholesterol: 179 mg/dL (ref 0–200)
Total CHOL/HDL Ratio: 5
Triglycerides: 91 mg/dL (ref 0.0–149.0)

## 2011-05-31 LAB — POCT URINALYSIS DIPSTICK
Bilirubin, UA: NEGATIVE
Blood, UA: NEGATIVE
Glucose, UA: NEGATIVE
Nitrite, UA: NEGATIVE
Spec Grav, UA: 1.025

## 2011-05-31 LAB — PSA: PSA: 1.72 ng/mL (ref 0.10–4.00)

## 2011-05-31 LAB — CBC WITH DIFFERENTIAL/PLATELET
Basophils Absolute: 0 10*3/uL (ref 0.0–0.1)
Eosinophils Absolute: 0.1 10*3/uL (ref 0.0–0.7)
HCT: 41 % (ref 39.0–52.0)
Lymphs Abs: 1.7 10*3/uL (ref 0.7–4.0)
MCHC: 33.9 g/dL (ref 30.0–36.0)
Monocytes Absolute: 0.5 10*3/uL (ref 0.1–1.0)
Monocytes Relative: 8.1 % (ref 3.0–12.0)
Platelets: 200 10*3/uL (ref 150.0–400.0)
RDW: 14.1 % (ref 11.5–14.6)

## 2011-05-31 LAB — HEPATIC FUNCTION PANEL
Albumin: 4.3 g/dL (ref 3.5–5.2)
Total Bilirubin: 0.6 mg/dL (ref 0.3–1.2)

## 2011-05-31 LAB — TSH: TSH: 1.36 u[IU]/mL (ref 0.35–5.50)

## 2011-05-31 MED ORDER — LISINOPRIL 20 MG PO TABS: 20.0000 mg | ORAL_TABLET | Freq: Every day | ORAL | Status: DC

## 2011-05-31 MED ORDER — ALBUTEROL 90 MCG/ACT IN AERS: 2.0000 | INHALATION_SPRAY | Freq: Four times a day (QID) | RESPIRATORY_TRACT | Status: DC | PRN

## 2011-05-31 MED ORDER — ZAFIRLUKAST 10 MG PO TABS: 10.0000 mg | ORAL_TABLET | Freq: Every day | ORAL | Status: DC

## 2011-05-31 MED ORDER — FLUTICASONE PROPIONATE HFA 44 MCG/ACT IN AERO: 1.0000 | INHALATION_SPRAY | Freq: Two times a day (BID) | RESPIRATORY_TRACT | Status: DC

## 2011-05-31 MED ORDER — HYDROCHLOROTHIAZIDE 25 MG PO TABS: 25.0000 mg | ORAL_TABLET | Freq: Every day | ORAL | Status: DC

## 2011-05-31 NOTE — Patient Instructions (Signed)
Continue your current medications.  Follow-up in one year, sooner if any problems 

## 2011-05-31 NOTE — Progress Notes (Signed)
  Subjective:    Patient ID: [name], male    DOB: 30-Apr-1935, 76 y.o.   MRN: [id number]  HPI   Ed is a 76 year old, married male, nonsmoker, who comes in today for Medicare wellness examination because of a history of chronic asthma and hypertension.  For his asthma.  He takes Flovent 40 42 puffs daily, and albuterol p.r.n.  He uses less than one canister of albuterol on a 12 month period of time.  He also takes Accolade 10 mg daily.  For hypertension.  He takes Mevacor, thiazide 25 mg daily, and lisinopril 20 daily, BP 118/80.  He gets routine eye care, hearing normal, regular dental care, walks daily, activities of daily living, normal, home safety reviewed.  No issues identified, no guns in the house, he does have a healthcare power of attorney, and a living will.  Cognitive function, normal   Review of Systems  Constitutional: Negative.   HENT: Negative.   Eyes: Negative.   Respiratory: Negative.   Cardiovascular: Negative.   Gastrointestinal: Negative.   Genitourinary: Negative.   Musculoskeletal: Negative.   Skin: Negative.   Neurological: Negative.   Hematological: Negative.   Psychiatric/Behavioral: Negative.        Objective:   Physical Exam  Constitutional: He is oriented to person, place, and time. He appears well-developed and well-nourished.  HENT:  Head: Normocephalic and atraumatic.  Right Ear: External ear normal.  Left Ear: External ear normal.  Nose: Nose normal.  Mouth/Throat: Oropharynx is clear and moist.  Eyes: Conjunctivae and EOM are normal. Pupils are equal, round, and reactive to light.  Neck: Normal range of motion. Neck supple. No JVD present. No tracheal deviation present. No thyromegaly present.  Cardiovascular: Normal rate, regular rhythm, normal heart sounds and intact distal pulses.  Exam reveals no gallop and no friction rub.   No murmur heard. Pulmonary/Chest: Effort normal and breath sounds normal. No stridor. No respiratory  distress. He has no wheezes. He has no rales. He exhibits no tenderness.  Abdominal: Soft. Bowel sounds are normal. He exhibits no distension and no mass. There is no tenderness. There is no rebound and no guarding.  Genitourinary: Rectum normal, prostate normal and penis normal. Guaiac negative stool. No penile tenderness.  Musculoskeletal: Normal range of motion. He exhibits no edema and no tenderness.  Lymphadenopathy:    He has no cervical adenopathy.  Neurological: He is alert and oriented to person, place, and time. He has normal reflexes. No cranial nerve deficit. He exhibits normal muscle tone.  Skin: Skin is warm and dry. No rash noted. No erythema. No pallor.  Psychiatric: He has a normal mood and affect. His behavior is normal. Judgment and thought content normal.          Assessment & Plan:  Healthy male.  Chronic asthma.  Continue current medication.  Hypertension.  Continue current medication.  Follow-up one year, sooner if any problems

## 2011-06-02 ENCOUNTER — Telehealth: Payer: Self-pay

## 2011-06-02 NOTE — Telephone Encounter (Signed)
[facility] pharmacy wants to verify that accolate instructions are once daily and not twice daily because typically the instructions are for twice daily. I read the directions off to her from the med list but they insist on this being verified by the doctor

## 2011-06-04 ENCOUNTER — Other Ambulatory Visit: Payer: Self-pay | Admitting: Family Medicine

## 2011-07-22 ENCOUNTER — Other Ambulatory Visit: Payer: Self-pay | Admitting: Family Medicine

## 2011-08-13 LAB — BASIC METABOLIC PANEL
BUN: 13
CO2: 27
Calcium: 10.8 — ABNORMAL HIGH
Chloride: 104
Creatinine, Ser: 1.02
GFR calc Af Amer: >60
GFR calc non Af Amer: >60
Glucose, Bld: 101 — ABNORMAL HIGH
Potassium: 3.4 — ABNORMAL LOW
Sodium: 141

## 2011-08-13 LAB — HEMOGLOBIN AND HEMATOCRIT, BLOOD
HCT: 42.4
Hemoglobin: 14.6

## 2011-11-30 ENCOUNTER — Other Ambulatory Visit: Payer: Self-pay | Admitting: *Deleted

## 2011-11-30 DIAGNOSIS — I10 Essential (primary) hypertension: Secondary | ICD-10-CM

## 2011-11-30 MED ORDER — LISINOPRIL 20 MG PO TABS: 20.0000 mg | ORAL_TABLET | Freq: Every day | ORAL | Status: DC

## 2011-12-07 ENCOUNTER — Other Ambulatory Visit: Payer: Self-pay | Admitting: *Deleted

## 2011-12-07 DIAGNOSIS — I10 Essential (primary) hypertension: Secondary | ICD-10-CM

## 2011-12-07 MED ORDER — LISINOPRIL 20 MG PO TABS: 20.0000 mg | ORAL_TABLET | Freq: Every day | ORAL | Status: DC

## 2011-12-07 MED ORDER — ZAFIRLUKAST 10 MG PO TABS: 10.0000 mg | ORAL_TABLET | Freq: Every day | ORAL | Status: DC

## 2011-12-07 MED ORDER — HYDROCHLOROTHIAZIDE 25 MG PO TABS: 25.0000 mg | ORAL_TABLET | Freq: Every day | ORAL | Status: DC

## 2011-12-07 MED ORDER — FLUTICASONE PROPIONATE HFA 44 MCG/ACT IN AERO: 1.0000 | INHALATION_SPRAY | Freq: Two times a day (BID) | RESPIRATORY_TRACT | Status: DC

## 2012-01-13 ENCOUNTER — Other Ambulatory Visit: Payer: Self-pay

## 2012-01-13 ENCOUNTER — Emergency Department (HOSPITAL_COMMUNITY): Payer: Medicare Other

## 2012-01-13 ENCOUNTER — Encounter (HOSPITAL_COMMUNITY): Payer: Self-pay | Admitting: Emergency Medicine

## 2012-01-13 ENCOUNTER — Emergency Department (HOSPITAL_COMMUNITY)
Admission: EM | Admit: 2012-01-13 | Discharge: 2012-01-13 | Disposition: A | Discharge status: Home / Self Care | Payer: Medicare Other | Attending: Emergency Medicine | Admitting: Emergency Medicine

## 2012-01-13 DIAGNOSIS — Z9889 Other specified postprocedural states: Secondary | ICD-10-CM | POA: Insufficient documentation

## 2012-01-13 DIAGNOSIS — H547 Unspecified visual loss: Secondary | ICD-10-CM | POA: Insufficient documentation

## 2012-01-13 DIAGNOSIS — Z79899 Other long term (current) drug therapy: Secondary | ICD-10-CM | POA: Insufficient documentation

## 2012-01-13 DIAGNOSIS — I10 Essential (primary) hypertension: Secondary | ICD-10-CM | POA: Insufficient documentation

## 2012-01-13 LAB — BASIC METABOLIC PANEL
BUN: 11 mg/dL (ref 6–23)
Chloride: 101 mEq/L (ref 96–112)
GFR calc Af Amer: 79 mL/min — ABNORMAL LOW (ref >90)
Potassium: 3 mEq/L — ABNORMAL LOW (ref 3.5–5.1)
Sodium: 140 mEq/L (ref 135–145)

## 2012-01-13 LAB — CBC
HCT: 40.9 % (ref 39.0–52.0)
Platelets: 218 10*3/uL (ref 150–400)
RDW: 13.4 % (ref 11.5–15.5)
WBC: 7.9 10*3/uL (ref 4.0–10.5)

## 2012-01-13 MED ORDER — POTASSIUM CHLORIDE CRYS ER 20 MEQ PO TBCR
40.0000 meq | EXTENDED_RELEASE_TABLET | Freq: Once | ORAL | Status: AC
  Administered 2012-01-13: 40 meq via ORAL
  Filled 2012-01-13: qty 2

## 2012-01-13 NOTE — ED Provider Notes (Signed)
History     CSN: [id number]  Arrival date & time 01/13/12  0011   First MD Initiated Contact with Patient 01/13/12 0319      Chief Complaint  Patient presents with  . Eye Problem    (Consider location/radiation/quality/duration/timing/severity/associated sxs/prior treatment) HPI Comments: 77 year old male with a history of cataract surgery bilateral eyes, hypertension and arthritis who presents with a complaint of acute visual loss to the right eye. He states that this was painless, started with seen flashing of light in the right eye, followed by feeling like his vision was going black almost like he was looking through a tube. Over the next 5 minutes he felt like his vision went gray and then returned back to normal. At this time he has no complaints including no changes in vision, blurriness, double vision and no visual field loss. The symptoms were temporary, resolve spontaneously and were not associated with weakness or numbness of his arms legs or face. He has no changes in his speech or balance or gait  The history is provided by the patient.    Past Medical History  Diagnosis Date  . Allergy   . Asthma   . GERD (gastroesophageal reflux disease)   . Hypertension   . Arthritis   . Kidney stone     Past Surgical History  Procedure Date  . Hernia repair   . Cataract extraction     Family History  Problem Relation Age of Onset  . Heart disease Mother   . Hypertension Mother   . Kidney disease Mother   . Heart disease Father   . Hypertension Father     History  Substance Use Topics  . Smoking status: Never Smoker   . Smokeless tobacco: Not on file  . Alcohol Use: No      Review of Systems  Constitutional: Negative for fever and chills.  HENT: Negative for sore throat and neck pain.   Eyes: Positive for visual disturbance. Negative for pain.  Respiratory: Negative for cough and shortness of breath.   Cardiovascular: Negative for chest pain.    Gastrointestinal: Negative for nausea, vomiting, abdominal pain and diarrhea.  Genitourinary: Negative for dysuria and frequency.  Musculoskeletal: Negative for back pain.  Skin: Negative for rash.  Neurological: Negative for weakness, numbness and headaches.  Hematological: Negative for adenopathy.  Psychiatric/Behavioral: Negative for behavioral problems.    Allergies  Review of patient's allergies indicates no known allergies.  Home Medications   Current Outpatient Rx  Name Route Sig Dispense Refill  . ALBUTEROL 90 MCG/ACT IN AERS Inhalation Inhale 2 puffs into the lungs every 6 (six) hours as needed. 17 g 2  . ASPIRIN 81 MG PO TBEC Oral Take 81 mg by mouth daily.      Marland Kitchen FLUTICASONE PROPIONATE  HFA 44 MCG/ACT IN AERO Inhalation Inhale 1 puff into the lungs 2 (two) times daily. 3 g 3  . HYDROCHLOROTHIAZIDE 25 MG PO TABS Oral Take 1 tablet (25 mg total) by mouth daily. 100 tablet 1  . IBUPROFEN 200 MG PO TABS Oral Take 200 mg by mouth every 6 (six) hours as needed.      Marland Kitchen LISINOPRIL 20 MG PO TABS Oral Take 1 tablet (20 mg total) by mouth daily. 100 tablet 1  . ONE-DAILY MULTI VITAMINS PO TABS Oral Take 1 tablet by mouth daily.      Marland Kitchen FISH OIL 1200 MG PO CAPS Oral Take 1 capsule by mouth daily.      Marland Kitchen  ZAFIRLUKAST 10 MG PO TABS Oral Take 1 tablet (10 mg total) by mouth daily. 90 tablet 1    BP 162/62  Pulse 60  Temp(Src) 98.7 F (37.1 C) (Oral)  Resp 18  SpO2 100%  Physical Exam  Nursing note and vitals reviewed. Constitutional: He appears well-developed and well-nourished. No distress.  HENT:  Head: Normocephalic and atraumatic.  Mouth/Throat: Oropharynx is clear and moist. No oropharyngeal exudate.  Eyes: Conjunctivae and EOM are normal. Pupils are equal, round, and reactive to light. Right eye exhibits no discharge. Left eye exhibits no discharge. No scleral icterus.       Retinal exam appears normal bil  Neck: Normal range of motion. Neck supple. No JVD present. No  thyromegaly present.       No carotid bruit bilaterally  Cardiovascular: Normal rate, regular rhythm, normal heart sounds and intact distal pulses.  Exam reveals no gallop and no friction rub.   No murmur heard. Pulmonary/Chest: Effort normal and breath sounds normal. No respiratory distress. He has no wheezes. He has no rales.  Abdominal: Soft. Bowel sounds are normal. He exhibits no distension and no mass. There is no tenderness.  Musculoskeletal: Normal range of motion. He exhibits no edema and no tenderness.  Lymphadenopathy:    He has no cervical adenopathy.  Neurological: He is alert. Coordination normal.  Skin: Skin is warm and dry. No rash noted. No erythema.  Psychiatric: He has a normal mood and affect. His behavior is normal.    ED Course  Procedures (including critical care time)  Labs Reviewed  BASIC METABOLIC PANEL - Abnormal; Notable for the following:    Potassium 3.0 (*)    Calcium 10.8 (*)    GFR calc non Af Amer 68 (*)    GFR calc Af Amer 79 (*)    All other components within normal limits  CBC   Ct Head Wo Contrast  01/13/2012  *RADIOLOGY REPORT*  Clinical Data: Temporary right-sided visual loss.  History of hypertension.  CT HEAD WITHOUT CONTRAST  Technique:  Contiguous axial images were obtained from the base of the skull through the vertex without contrast.  Comparison: None.  Findings: There is no evidence of acute infarction, mass lesion, or intra- or extra-axial hemorrhage on CT.  The posterior fossa, including the cerebellum, brainstem and fourth ventricle, is within normal limits.  The third and lateral ventricles, and basal ganglia are unremarkable in appearance.  The cerebral hemispheres are symmetric in appearance, with normal gray- white differentiation.  No mass effect or midline shift is seen.  There is no evidence of fracture; visualized osseous structures are unremarkable in appearance.  The visualized portions of the orbits are within normal limits.   The paranasal sinuses and mastoid air cells are well-aerated.  No significant soft tissue abnormalities are seen.  IMPRESSION: Unremarkable noncontrast CT of the head.  Original Report Authenticated By: Tonia Ghent, M.D.     1. Loss of vision       MDM  Patient has normal gait, normal speech, visual acuity is 20/20 in the right eye and 20/25 in the left eye. Currently his posterior exam shows no obvious signs of hemorrhage or retinal detachment. His vital signs are normal and there is no signs of bruit on exam. I discussed the care with Dr. Maple Hudson who is on call for his ophthalmologist this evening who agrees with further workup for possible vascular source and followup in the morning with Dr. Vonna Kotyk   CT scan according to  my interpretation and radiologist interpretation shows no acute findings  Laboratory values normal except for low potassium which has been replaced.  He should followup with Dr. Vonna Kotyk today for further evaluation.  No recurrence of visual loss while in the emergency department     Vida Roller, MD 01/13/12 [phone]

## 2012-01-13 NOTE — ED Notes (Signed)
Pt states he was sitting watching TV and out of his right eye he saw some flashing and then things became dark then he only could see about half his normal vision  States that lasted a few minutes then started going back to normal  Pt states it is close to normal now  Pt states he took a two or three baby aspirins when it happened

## 2012-01-13 NOTE — Discharge Instructions (Signed)
I have discussed Your care with the on-call ophthalmologist who suggested that she followup closely with Dr. Vonna Kotyk today. Please call the office this morning to arrange the followup time. If he should develop recurrent loss of vision he should return to the emergency department immediately. Your CT scan of the brain was normal, your blood work has been normal as well other than a slightly low potassium level. We have given you a potassium supplement to help arrange for potassium level.  Visual Disturbances You have had a disturbance in your vision. This may be caused by various conditions, such as:  Migraines. Migraine headaches are often preceded by a disturbance in vision. Blind spots or light flashes are followed by a headache. This type of visual disturbance is temporary. It does not damage the eye.   Glaucoma. This is caused by increased pressure in the eye. Symptoms include haziness, blurred vision, or seeing rainbow colored circles when looking at bright lights. Partial or complete visual loss can occur. You may or may not experience eye pain. Visual loss may be gradual or sudden and is irreversible. Glaucoma is the leading cause of blindness.   Retina problems. Vision will be reduced if the retina becomes detached or if there is a circulation problem as with diabetes, high blood pressure, or a mini-stroke. Symptoms include seeing "floaters," flashes of light, or shadows, as if a curtain has fallen over your eye.   Optic nerve problems. The main nerve in your eye can be damaged by redness, soreness, and swelling (inflammation), poor circulation, drugs, and toxins.  It is very important to have a complete exam done by a specialist to determine the exact cause of your eye problem. The specialist may recommend medicines or surgery, depending on the cause of the problem. This can help prevent further loss of vision or reduce the risk of having a stroke. Contact the caregiver to whom you have been  referred and arrange for follow-up care right away. SEEK IMMEDIATE MEDICAL CARE IF:   Your vision gets worse.   You develop severe headaches.   You have any weakness or numbness in the face, arms, or legs.   You have any trouble speaking or walking.  Document Released: 12/09/2004 Document Revised: 07/14/2011 Document Reviewed: 04/01/2010 [facility] Patient Information 2012 [place], [place].

## 2012-01-14 ENCOUNTER — Telehealth: Payer: Self-pay | Admitting: *Deleted

## 2012-01-14 NOTE — Telephone Encounter (Signed)
Pt was told in the ER he needed to see his Primary Care MD for a possible carotid Ultra Sound.

## 2012-01-17 ENCOUNTER — Ambulatory Visit: Payer: Medicare Other | Admitting: Family Medicine

## 2012-01-19 ENCOUNTER — Other Ambulatory Visit: Payer: Self-pay | Admitting: Cardiology

## 2012-01-19 DIAGNOSIS — H53129 Transient visual loss, unspecified eye: Secondary | ICD-10-CM

## 2012-01-24 ENCOUNTER — Encounter (INDEPENDENT_AMBULATORY_CARE_PROVIDER_SITE_OTHER): Payer: Medicare Other

## 2012-01-24 DIAGNOSIS — I6529 Occlusion and stenosis of unspecified carotid artery: Secondary | ICD-10-CM

## 2012-01-24 DIAGNOSIS — H53129 Transient visual loss, unspecified eye: Secondary | ICD-10-CM

## 2012-02-14 ENCOUNTER — Telehealth: Payer: Self-pay | Admitting: Family Medicine

## 2012-02-14 NOTE — Telephone Encounter (Signed)
FYI.. pts insurance is faxing over a form for a change in Meds. Pt is wanting to get on Montelukast 10mg . Insurance company told pt to contact PCP about change

## 2012-02-15 MED ORDER — MONTELUKAST SODIUM 10 MG PO TABS: 10.0000 mg | ORAL_TABLET | Freq: Every day | ORAL | Status: DC

## 2012-02-15 NOTE — Telephone Encounter (Signed)
Addended by: Kern Reap B on: 02/15/2012 05:13 PM   Modules accepted: Orders

## 2012-06-08 ENCOUNTER — Other Ambulatory Visit (INDEPENDENT_AMBULATORY_CARE_PROVIDER_SITE_OTHER): Payer: Medicare Other

## 2012-06-08 DIAGNOSIS — I10 Essential (primary) hypertension: Secondary | ICD-10-CM

## 2012-06-08 DIAGNOSIS — Z0000 Encounter for general adult medical examination without abnormal findings: Secondary | ICD-10-CM

## 2012-06-08 DIAGNOSIS — Z125 Encounter for screening for malignant neoplasm of prostate: Secondary | ICD-10-CM

## 2012-06-08 LAB — CBC WITH DIFFERENTIAL/PLATELET
Basophils Relative: 0.7 % (ref 0.0–3.0)
Eosinophils Absolute: 0.2 10*3/uL (ref 0.0–0.7)
Eosinophils Relative: 3.6 % (ref 0.0–5.0)
HCT: 41.1 % (ref 39.0–52.0)
Hemoglobin: 13.6 g/dL (ref 13.0–17.0)
MCHC: 33 g/dL (ref 30.0–36.0)
MCV: 91.9 fl (ref 78.0–100.0)
Monocytes Absolute: 0.5 10*3/uL (ref 0.1–1.0)
Neutro Abs: 3.3 10*3/uL (ref 1.4–7.7)
RBC: 4.47 Mil/uL (ref 4.22–5.81)
WBC: 6.2 10*3/uL (ref 4.5–10.5)

## 2012-06-08 LAB — LIPID PANEL
Cholesterol: 190 mg/dL (ref 0–200)
LDL Cholesterol: 126 mg/dL — ABNORMAL HIGH (ref 0–99)
Total CHOL/HDL Ratio: 5
Triglycerides: 109 mg/dL (ref 0.0–149.0)

## 2012-06-08 LAB — HEPATIC FUNCTION PANEL
ALT: 23 U/L (ref 0–53)
Albumin: 3.9 g/dL (ref 3.5–5.2)
Total Protein: 6.8 g/dL (ref 6.0–8.3)

## 2012-06-08 LAB — POCT URINALYSIS DIPSTICK
Bilirubin, UA: NEGATIVE
Glucose, UA: NEGATIVE
Nitrite, UA: NEGATIVE
Spec Grav, UA: >=1.03
Urobilinogen, UA: 0.2

## 2012-06-08 LAB — BASIC METABOLIC PANEL
CO2: 29 mEq/L (ref 19–32)
Chloride: 104 mEq/L (ref 96–112)
Potassium: 4.2 mEq/L (ref 3.5–5.1)
Sodium: 140 mEq/L (ref 135–145)

## 2012-06-08 LAB — PSA: PSA: 1.99 ng/mL (ref 0.10–4.00)

## 2012-06-15 ENCOUNTER — Ambulatory Visit (INDEPENDENT_AMBULATORY_CARE_PROVIDER_SITE_OTHER): Payer: Medicare Other | Admitting: Family Medicine

## 2012-06-15 ENCOUNTER — Encounter: Payer: Self-pay | Admitting: Family Medicine

## 2012-06-15 VITALS — BP 130/84 | Temp 98.6°F | Ht 64.25 in | Wt 163.0 lb

## 2012-06-15 DIAGNOSIS — Z0000 Encounter for general adult medical examination without abnormal findings: Secondary | ICD-10-CM

## 2012-06-15 DIAGNOSIS — J309 Allergic rhinitis, unspecified: Secondary | ICD-10-CM

## 2012-06-15 DIAGNOSIS — J45909 Unspecified asthma, uncomplicated: Secondary | ICD-10-CM

## 2012-06-15 DIAGNOSIS — I10 Essential (primary) hypertension: Secondary | ICD-10-CM

## 2012-06-15 DIAGNOSIS — K219 Gastro-esophageal reflux disease without esophagitis: Secondary | ICD-10-CM

## 2012-06-15 DIAGNOSIS — N529 Male erectile dysfunction, unspecified: Secondary | ICD-10-CM

## 2012-06-15 DIAGNOSIS — M1990 Unspecified osteoarthritis, unspecified site: Secondary | ICD-10-CM

## 2012-06-15 DIAGNOSIS — G43809 Other migraine, not intractable, without status migrainosus: Secondary | ICD-10-CM

## 2012-06-15 MED ORDER — MONTELUKAST SODIUM 10 MG PO TABS: 10.0000 mg | ORAL_TABLET | Freq: Every day | ORAL | Status: DC

## 2012-06-15 MED ORDER — ZAFIRLUKAST 10 MG PO TABS: 10.0000 mg | ORAL_TABLET | Freq: Every day | ORAL | Status: DC

## 2012-06-15 MED ORDER — LISINOPRIL-HYDROCHLOROTHIAZIDE 10-12.5 MG PO TABS: 1.0000 | ORAL_TABLET | Freq: Every day | ORAL | Status: DC

## 2012-06-15 MED ORDER — ALBUTEROL SULFATE HFA 108 (90 BASE) MCG/ACT IN AERS: 2.0000 | INHALATION_SPRAY | Freq: Four times a day (QID) | RESPIRATORY_TRACT | Status: DC | PRN

## 2012-06-15 MED ORDER — FLUTICASONE PROPIONATE HFA 44 MCG/ACT IN AERO: 1.0000 | INHALATION_SPRAY | Freq: Two times a day (BID) | RESPIRATORY_TRACT | Status: DC

## 2012-06-15 NOTE — Addendum Note (Signed)
Addended by: Mauri Reading on: 06/15/2012 04:50 PM   Modules accepted: Orders

## 2012-06-15 NOTE — Progress Notes (Signed)
  Subjective:    Patient ID: [name], male    DOB: 09-27-1935, 77 y.o.   MRN: [id number]  HPI [name] is a 77 year old married male nonsmoker who comes in today for a Medicare wellness examination  He takes albuterol when necessary for asthma and Flovent one puff twice a day states he feels well lungs are okay no wheezing  He has decreased his HIDA chlorothiazide 12.5 mg and his lisinopril to 10 mg. BP 130/84  He takes he also takes Singulair 10 mg daily and Accolade 10 mg each bedtime  He gets routine eye care by Dr. Vonna Kotyk recently had a calcium deposit that cause interference with his vision he required no surgery it resolved spontaneously.  Tetanus 2009 shingles 2010 Pneumovax x2.  Cognitive function normal he walks on a regular basis at home health safety reviewed no issues identified, no guns in the house, he does have a health care power of attorney and living well   Review of Systems  Constitutional: Negative.   HENT: Negative.   Eyes: Negative.   Respiratory: Negative.   Cardiovascular: Negative.   Gastrointestinal: Negative.   Genitourinary: Negative.   Musculoskeletal: Negative.   Skin: Negative.   Neurological: Negative.   Hematological: Negative.   Psychiatric/Behavioral: Negative.        Objective:   Physical Exam  Constitutional: He is oriented to person, place, and time. He appears well-developed and well-nourished.  HENT:  Head: Normocephalic and atraumatic.  Right Ear: External ear normal.  Left Ear: External ear normal.  Nose: Nose normal.  Mouth/Throat: Oropharynx is clear and moist.  Eyes: Conjunctivae and EOM are normal. Pupils are equal, round, and reactive to light.  Neck: Normal range of motion. Neck supple. No JVD present. No tracheal deviation present. No thyromegaly present.  Cardiovascular: Normal rate, regular rhythm, normal heart sounds and intact distal pulses.  Exam reveals no gallop and no friction rub.   No murmur  heard. Pulmonary/Chest: Effort normal and breath sounds normal. No stridor. No respiratory distress. He has no wheezes. He has no rales. He exhibits no tenderness.  Abdominal: Soft. Bowel sounds are normal. He exhibits no distension and no mass. There is no tenderness. There is no rebound and no guarding.  Genitourinary: Rectum normal, prostate normal and penis normal. Guaiac negative stool. No penile tenderness.  Musculoskeletal: Normal range of motion. He exhibits no edema and no tenderness.  Lymphadenopathy:    He has no cervical adenopathy.  Neurological: He is alert and oriented to person, place, and time. He has normal reflexes. No cranial nerve deficit. He exhibits normal muscle tone.  Skin: Skin is warm and dry. No rash noted. No erythema. No pallor.  Psychiatric: He has a normal mood and affect. His behavior is normal. Judgment and thought content normal.          Assessment & Plan:  Healthy male  Myasthenia gravis stable  Asthma stable continue current medications  Hypertension decrease lisinopril and hydrochlorothiazide  Return in one year sooner if any problems

## 2012-06-15 NOTE — Patient Instructions (Addendum)
Continue your current medications except we combined the hydrochlorothiazide and lisinopril in  one tablet  All call you I gets her lab work back

## 2012-12-11 ENCOUNTER — Other Ambulatory Visit: Payer: Self-pay | Admitting: *Deleted

## 2012-12-11 DIAGNOSIS — J45909 Unspecified asthma, uncomplicated: Secondary | ICD-10-CM

## 2012-12-11 MED ORDER — FLUTICASONE PROPIONATE HFA 44 MCG/ACT IN AERO: 1.0000 | INHALATION_SPRAY | Freq: Two times a day (BID) | RESPIRATORY_TRACT | Status: DC

## 2013-02-02 ENCOUNTER — Telehealth: Payer: Self-pay | Admitting: Family Medicine

## 2013-02-02 NOTE — Telephone Encounter (Signed)
New Problem:    Patient called in wanting to know why he needs another appointment to have a carotid.  Please call back.

## 2013-02-16 ENCOUNTER — Encounter (INDEPENDENT_AMBULATORY_CARE_PROVIDER_SITE_OTHER): Payer: Medicare Other

## 2013-02-16 DIAGNOSIS — I6529 Occlusion and stenosis of unspecified carotid artery: Secondary | ICD-10-CM

## 2013-06-18 ENCOUNTER — Encounter: Payer: Medicare Other | Admitting: Family Medicine

## 2013-07-12 ENCOUNTER — Other Ambulatory Visit: Payer: Self-pay | Admitting: *Deleted

## 2013-07-12 DIAGNOSIS — J45909 Unspecified asthma, uncomplicated: Secondary | ICD-10-CM

## 2013-07-12 DIAGNOSIS — J309 Allergic rhinitis, unspecified: Secondary | ICD-10-CM

## 2013-07-12 DIAGNOSIS — I10 Essential (primary) hypertension: Secondary | ICD-10-CM

## 2013-07-12 MED ORDER — LISINOPRIL-HYDROCHLOROTHIAZIDE 10-12.5 MG PO TABS: 1.0000 | ORAL_TABLET | Freq: Every day | ORAL | Status: DC

## 2013-07-12 MED ORDER — MONTELUKAST SODIUM 10 MG PO TABS: 10.0000 mg | ORAL_TABLET | Freq: Every day | ORAL | Status: DC

## 2013-08-27 ENCOUNTER — Encounter: Payer: Self-pay | Admitting: Family Medicine

## 2013-08-27 ENCOUNTER — Ambulatory Visit (INDEPENDENT_AMBULATORY_CARE_PROVIDER_SITE_OTHER): Payer: Medicare Other | Admitting: Family Medicine

## 2013-08-27 ENCOUNTER — Other Ambulatory Visit: Payer: Self-pay | Admitting: Family Medicine

## 2013-08-27 VITALS — BP 150/96 | Temp 98.8°F | Ht 63.5 in | Wt 160.0 lb

## 2013-08-27 DIAGNOSIS — Z0000 Encounter for general adult medical examination without abnormal findings: Secondary | ICD-10-CM

## 2013-08-27 DIAGNOSIS — N401 Enlarged prostate with lower urinary tract symptoms: Secondary | ICD-10-CM

## 2013-08-27 DIAGNOSIS — M1990 Unspecified osteoarthritis, unspecified site: Secondary | ICD-10-CM

## 2013-08-27 DIAGNOSIS — Z23 Encounter for immunization: Secondary | ICD-10-CM

## 2013-08-27 DIAGNOSIS — G7000 Myasthenia gravis without (acute) exacerbation: Secondary | ICD-10-CM

## 2013-08-27 DIAGNOSIS — N138 Other obstructive and reflux uropathy: Secondary | ICD-10-CM

## 2013-08-27 DIAGNOSIS — I10 Essential (primary) hypertension: Secondary | ICD-10-CM

## 2013-08-27 DIAGNOSIS — K219 Gastro-esophageal reflux disease without esophagitis: Secondary | ICD-10-CM

## 2013-08-27 DIAGNOSIS — J309 Allergic rhinitis, unspecified: Secondary | ICD-10-CM

## 2013-08-27 DIAGNOSIS — J45909 Unspecified asthma, uncomplicated: Secondary | ICD-10-CM

## 2013-08-27 LAB — CBC WITH DIFFERENTIAL/PLATELET
Basophils Relative: 0.3 % (ref 0.0–3.0)
Eosinophils Relative: 1.9 % (ref 0.0–5.0)
Lymphocytes Relative: 27.5 % (ref 12.0–46.0)
MCV: 90.1 fl (ref 78.0–100.0)
Monocytes Absolute: 0.7 10*3/uL (ref 0.1–1.0)
Monocytes Relative: 9 % (ref 3.0–12.0)
Neutro Abs: 4.5 10*3/uL (ref 1.4–7.7)
Neutrophils Relative %: 61.3 % (ref 43.0–77.0)
Platelets: 223 10*3/uL (ref 150.0–400.0)
RBC: 4.93 Mil/uL (ref 4.22–5.81)
WBC: 7.3 10*3/uL (ref 4.5–10.5)

## 2013-08-27 LAB — BASIC METABOLIC PANEL
CO2: 31 mEq/L (ref 19–32)
Chloride: 103 mEq/L (ref 96–112)
Creatinine, Ser: 1.2 mg/dL (ref 0.4–1.5)
GFR: 78.74 mL/min (ref >60.00)
Potassium: 4.6 mEq/L (ref 3.5–5.1)
Sodium: 140 mEq/L (ref 135–145)

## 2013-08-27 LAB — TSH: TSH: 1.8 u[IU]/mL (ref 0.35–5.50)

## 2013-08-27 LAB — POCT URINALYSIS DIPSTICK
Bilirubin, UA: NEGATIVE
Glucose, UA: NEGATIVE
Ketones, UA: NEGATIVE
Spec Grav, UA: 1.02

## 2013-08-27 LAB — HEPATIC FUNCTION PANEL
AST: 33 U/L (ref 0–37)
Bilirubin, Direct: 0.1 mg/dL (ref 0.0–0.3)
Total Bilirubin: 0.6 mg/dL (ref 0.3–1.2)
Total Protein: 7.1 g/dL (ref 6.0–8.3)

## 2013-08-27 LAB — PSA: PSA: 2.24 ng/mL (ref 0.10–4.00)

## 2013-08-27 MED ORDER — LISINOPRIL-HYDROCHLOROTHIAZIDE 10-12.5 MG PO TABS: 1.0000 | ORAL_TABLET | Freq: Every day | ORAL | Status: DC

## 2013-08-27 MED ORDER — MONTELUKAST SODIUM 10 MG PO TABS: 10.0000 mg | ORAL_TABLET | Freq: Every day | ORAL | Status: DC

## 2013-08-27 MED ORDER — FLUTICASONE PROPIONATE HFA 44 MCG/ACT IN AERO: 1.0000 | INHALATION_SPRAY | Freq: Two times a day (BID) | RESPIRATORY_TRACT | Status: DC

## 2013-08-27 NOTE — Patient Instructions (Signed)
Continue medications  Remember to take your blood pressure medication daily in the morning  Check your blood pressure daily .........Marland Kitchenreturn in 4 weeks for followup  When you return bring a record of all your blood pressure readings and the device  Walk 30 minutes daily

## 2013-08-27 NOTE — Progress Notes (Signed)
  Subjective:    Patient ID: [name], male    DOB: November 10, 1935, 78 y.o.   MRN: [id number]  HPI Ammar is a delightful 78 year old married male nonsmoker who comes in today for a Medicare wellness examination because of a history of asthma, allergic rhinitis, hypertension,  His blood pressure at home averages 140/70 he didn't take his blood pressure medication today. BP 150/98  Medication reviewed it was correct  He gets routine eye care, dental care, colonoscopy and GI, vaccinations up-to-date except he needs a flu shot.  Cognitive function normal he walks on a regular basis home health safety reviewed no issues identified, no guns in the house, he does have a health care power of attorney and living will   Review of Systems  Constitutional: Negative.   HENT: Negative.   Eyes: Negative.   Respiratory: Negative.   Cardiovascular: Negative.   Gastrointestinal: Negative.   Endocrine: Negative.   Genitourinary: Negative.   Musculoskeletal: Negative.   Skin: Negative.   Allergic/Immunologic: Negative.   Neurological: Negative.   Hematological: Negative.   Psychiatric/Behavioral: Negative.        Objective:   Physical Exam  Constitutional: He is oriented to person, place, and time. He appears well-developed and well-nourished.  HENT:  Head: Normocephalic and atraumatic.  Right Ear: External ear normal.  Left Ear: External ear normal.  Nose: Nose normal.  Mouth/Throat: Oropharynx is clear and moist.  Eyes: Conjunctivae and EOM are normal. Pupils are equal, round, and reactive to light. Right eye exhibits no discharge. Left eye exhibits no discharge.  Neck: Normal range of motion. Neck supple. No JVD present. No tracheal deviation present. No thyromegaly present.  Cardiovascular: Normal rate, regular rhythm, normal heart sounds and intact distal pulses.  Exam reveals no gallop and no friction rub.   No murmur heard. No carotid or urinary bruits peripheral pulses 2+ and  symmetrical  Pulmonary/Chest: Effort normal and breath sounds normal. No stridor. No respiratory distress. He has no wheezes. He has no rales. He exhibits no tenderness.  Abdominal: Soft. Bowel sounds are normal. He exhibits no distension and no mass. There is no tenderness. There is no rebound and no guarding.  Genitourinary: Rectum normal, prostate normal and penis normal. Guaiac negative stool. No penile tenderness.  Musculoskeletal: Normal range of motion. He exhibits no edema and no tenderness.  Lymphadenopathy:    He has no cervical adenopathy.  Neurological: He is alert and oriented to person, place, and time. He has normal reflexes. He displays normal reflexes. No cranial nerve deficit. He exhibits normal muscle tone.  Skin: Skin is warm and dry. No rash noted. No erythema. No pallor.  Psychiatric: He has a normal mood and affect. His behavior is normal. Judgment and thought content normal.          Assessment & Plan:  Healthy male  Hypertension not at goal because he didn't take his medicine today planned check BP daily take medicine daily followup in one month  Asthma continue Flovent and albuterol when necessary  Osteoarthritis Motrin 200 mg twice a day when necessary  Myasthenia gravis in remission

## 2013-08-29 LAB — LIPID PANEL
Cholesterol: 195 mg/dL (ref 0–200)
HDL: 42.6 mg/dL (ref >39.00)
LDL Cholesterol: 133 mg/dL — ABNORMAL HIGH (ref 0–99)
Total CHOL/HDL Ratio: 5
Triglycerides: 99 mg/dL (ref 0.0–149.0)

## 2013-09-19 ENCOUNTER — Other Ambulatory Visit (HOSPITAL_COMMUNITY): Payer: Self-pay | Admitting: Urology

## 2013-09-19 DIAGNOSIS — N135 Crossing vessel and stricture of ureter without hydronephrosis: Secondary | ICD-10-CM

## 2013-09-21 ENCOUNTER — Ambulatory Visit (HOSPITAL_COMMUNITY)
Admission: RE | Admit: 2013-09-21 | Discharge: 2013-09-21 | Disposition: A | Discharge status: Home / Self Care | Payer: Medicare Other | Source: Ambulatory Visit | Attending: Urology | Admitting: Urology

## 2013-09-21 DIAGNOSIS — N135 Crossing vessel and stricture of ureter without hydronephrosis: Secondary | ICD-10-CM

## 2013-09-21 DIAGNOSIS — N1330 Unspecified hydronephrosis: Secondary | ICD-10-CM | POA: Insufficient documentation

## 2013-09-21 MED ORDER — FUROSEMIDE 10 MG/ML IJ SOLN
40.0000 mg | Freq: Once | INTRAMUSCULAR | Status: AC
  Administered 2013-09-21: 40 mg via INTRAVENOUS
  Filled 2013-09-21: qty 4

## 2013-09-21 MED ORDER — TECHNETIUM TC 99M MERTIATIDE
15.7000 | Freq: Once | INTRAVENOUS | Status: AC | PRN
  Administered 2013-09-21: 16 via INTRAVENOUS

## 2013-09-27 ENCOUNTER — Encounter: Payer: Self-pay | Admitting: Family Medicine

## 2013-09-27 ENCOUNTER — Ambulatory Visit (INDEPENDENT_AMBULATORY_CARE_PROVIDER_SITE_OTHER): Payer: Medicare Other | Admitting: Family Medicine

## 2013-09-27 VITALS — BP 180/90 | Temp 98.0°F | Wt 167.0 lb

## 2013-09-27 DIAGNOSIS — I10 Essential (primary) hypertension: Secondary | ICD-10-CM

## 2013-09-27 MED ORDER — LISINOPRIL-HYDROCHLOROTHIAZIDE 20-12.5 MG PO TABS: 1.0000 | ORAL_TABLET | Freq: Every day | ORAL | Status: DC

## 2013-09-27 NOTE — Progress Notes (Signed)
  Subjective:    Patient ID: [name], male    DOB: 1935-10-07, 78 y.o.   MRN: [id number]  HPI [name] is a 78 year old male who comes in today for followup of hypertension  He's on Zestoretic 10-12.5 daily for hypertension BP today is 180/90. He brings in a digital blood pressure cuff which is also 180/90 however his home blood pressure readings are normal. He's not anxious. I suspect it was some type of mechanical problem I think his blood pressure really is 180/90   Review of Systems Review of systems otherwise negative    Objective:   Physical Exam Well-developed well-nourished in no acute distress BP right arm sitting position 180/90       Assessment & Plan:  Hypertension not at call increase lisinopril to 20 mg daily followup in one month

## 2013-09-27 NOTE — Patient Instructions (Signed)
Zestoretic 20 mg,,,,,,,,,,,,,,, daily  BP checked daily right arm sitting position  Followup in 4 weeks  During the data and the device

## 2013-09-27 NOTE — Progress Notes (Signed)
Pre visit review using our clinic review tool, if applicable. No additional management support is needed unless otherwise documented below in the visit note. 

## 2013-10-02 ENCOUNTER — Other Ambulatory Visit (HOSPITAL_COMMUNITY): Payer: Self-pay | Admitting: Urology

## 2013-10-02 DIAGNOSIS — N281 Cyst of kidney, acquired: Secondary | ICD-10-CM

## 2013-10-16 ENCOUNTER — Ambulatory Visit (HOSPITAL_COMMUNITY)
Admission: RE | Admit: 2013-10-16 | Discharge: 2013-10-16 | Disposition: A | Discharge status: Home / Self Care | Payer: Medicare Other | Source: Ambulatory Visit | Attending: Urology | Admitting: Urology

## 2013-10-16 DIAGNOSIS — R918 Other nonspecific abnormal finding of lung field: Secondary | ICD-10-CM | POA: Insufficient documentation

## 2013-10-16 DIAGNOSIS — N281 Cyst of kidney, acquired: Secondary | ICD-10-CM

## 2013-10-16 DIAGNOSIS — K7689 Other specified diseases of liver: Secondary | ICD-10-CM | POA: Insufficient documentation

## 2013-10-16 LAB — CREATININE, SERUM
GFR calc Af Amer: 65 mL/min — ABNORMAL LOW (ref >90)
GFR calc non Af Amer: 56 mL/min — ABNORMAL LOW (ref >90)

## 2013-10-16 MED ORDER — GADOBENATE DIMEGLUMINE 529 MG/ML IV SOLN
15.0000 mL | Freq: Once | INTRAVENOUS | Status: AC | PRN
  Administered 2013-10-16: 15 mL via INTRAVENOUS

## 2013-11-01 ENCOUNTER — Encounter: Payer: Self-pay | Admitting: Family Medicine

## 2013-11-01 ENCOUNTER — Ambulatory Visit (INDEPENDENT_AMBULATORY_CARE_PROVIDER_SITE_OTHER): Payer: Medicare Other | Admitting: Family Medicine

## 2013-11-01 VITALS — BP 180/100 | Temp 98.4°F | Wt 165.0 lb

## 2013-11-01 DIAGNOSIS — I10 Essential (primary) hypertension: Secondary | ICD-10-CM

## 2013-11-01 NOTE — Patient Instructions (Signed)
Lisinopril 20/12.5,,,,,,,,,,,, one half tab daily in the morning  Check your blood pressure daily in the morning  Followup in the first week in February

## 2013-11-01 NOTE — Progress Notes (Signed)
   Subjective:    Patient ID: [name], male    DOB: 1935/02/27, 78 y.o.   MRN: [id number]  HPI It is a 78 year old male married nonsmoker who comes in today for followup of hypertension. His BPs at home are 120/70 however his BP today is 180/80. He said he had some diagnostic studies done at the urology Center and nobody call and report. He family reports online and became alarmed because of the terms they use. He did not understand what was wrong and was concerned he might have kidney failure. He tried to call the urology Center but nobody called him back.   Review of Systems    negative Objective:   Physical Exam Vital signs stable he is afebrile BP 120/70       Assessment & Plan:  Hypertension decrease lisinopril to 10 mg daily followup in one month

## 2013-12-18 ENCOUNTER — Ambulatory Visit (INDEPENDENT_AMBULATORY_CARE_PROVIDER_SITE_OTHER): Payer: Medicare Other | Admitting: Family Medicine

## 2013-12-18 ENCOUNTER — Encounter: Payer: Self-pay | Admitting: Family Medicine

## 2013-12-18 VITALS — BP 130/84 | Temp 98.8°F | Wt 168.0 lb

## 2013-12-18 DIAGNOSIS — I10 Essential (primary) hypertension: Secondary | ICD-10-CM

## 2013-12-18 NOTE — Patient Instructions (Signed)
Continue your current dose of medications  Followup in October. Call in August

## 2013-12-18 NOTE — Progress Notes (Signed)
   Subjective:    Patient ID: [name], male    DOB: 09/20/1935, 79 y.o.   MRN: [id number]  HPI At is a 79 -year-old male who comes in today for follow up of hypotension  He was on 20 mg of Zestoretic and his blood pressure is 110/70 he was lightheaded. We therefore treat increase the dose to 10 mg his BP now 130/84 and no more spells of lightheadedness.   Review of Systems    review of systems negative no more postural hypotension Objective:   Physical Exam Well-developed well-nourished male in no acute distress vital signs stable BP 130/84       Assessment & Plan:  Hypertension at goal continue current therapy followup in October

## 2013-12-19 ENCOUNTER — Telehealth: Payer: Self-pay | Admitting: Family Medicine

## 2013-12-19 NOTE — Telephone Encounter (Signed)
Relevant patient education assigned to patient using Emmi. ° °

## 2014-04-10 ENCOUNTER — Ambulatory Visit (INDEPENDENT_AMBULATORY_CARE_PROVIDER_SITE_OTHER): Payer: Medicare Other | Admitting: Family Medicine

## 2014-04-10 ENCOUNTER — Encounter: Payer: Self-pay | Admitting: Family Medicine

## 2014-04-10 VITALS — BP 110/80 | Temp 98.7°F | Wt 161.0 lb

## 2014-04-10 DIAGNOSIS — Z8709 Personal history of other diseases of the respiratory system: Secondary | ICD-10-CM

## 2014-04-10 DIAGNOSIS — Z87898 Personal history of other specified conditions: Secondary | ICD-10-CM

## 2014-04-10 NOTE — Progress Notes (Signed)
   Subjective:    Patient ID: [name], male    DOB: [birth year], 79 y.o.   MRN: [id number]  HPI [name] is a 79 year old male married nonsmoker who comes in today on referral from urology for evaluation of pulmonary nodules  He had a CT scan of his abdomen in December which showed some pulmonary nodules. Followup CT scan 2 weeks ago questions whether the nodules have gotten bigger.  Pulmonary-wise he's asymptomatic  In reviewing the CT scans including the recent CT scan of his chest I see no evidence of anything abnormal. The pulmonary nodules measure 5 mm and are basically unchanged. Dr. Reche Dixon recommends followup CT scanning in 6-12 months which I think is reasonable   Review of Systems Review of systems negative    Objective:   Physical Exam  Well-developed well nourished male no acute distress vital signs stable he is afebrile pulmonary exam normal except for some mild decreased breath sounds      Assessment & Plan:  Pulmonary nodules asymptomatic followup CT scan in 6-12 months as recommended by radiology

## 2014-04-10 NOTE — Patient Instructions (Signed)
We will order a followup CT scan of your chest in December 2015,,,,,,,,,,,, call me if you have any problems prior

## 2014-04-10 NOTE — Progress Notes (Signed)
Pre visit review using our clinic review tool, if applicable. No additional management support is needed unless otherwise documented below in the visit note. 

## 2014-04-23 ENCOUNTER — Emergency Department (HOSPITAL_COMMUNITY)
Admission: EM | Admit: 2014-04-23 | Discharge: 2014-04-24 | Disposition: A | Discharge status: Home / Self Care | Payer: Medicare Other | Attending: Emergency Medicine | Admitting: Emergency Medicine

## 2014-04-23 ENCOUNTER — Encounter (HOSPITAL_COMMUNITY): Payer: Self-pay | Admitting: Emergency Medicine

## 2014-04-23 DIAGNOSIS — Z87442 Personal history of urinary calculi: Secondary | ICD-10-CM | POA: Insufficient documentation

## 2014-04-23 DIAGNOSIS — IMO0002 Reserved for concepts with insufficient information to code with codable children: Secondary | ICD-10-CM | POA: Insufficient documentation

## 2014-04-23 DIAGNOSIS — Z8719 Personal history of other diseases of the digestive system: Secondary | ICD-10-CM | POA: Insufficient documentation

## 2014-04-23 DIAGNOSIS — I10 Essential (primary) hypertension: Secondary | ICD-10-CM | POA: Insufficient documentation

## 2014-04-23 DIAGNOSIS — J45909 Unspecified asthma, uncomplicated: Secondary | ICD-10-CM | POA: Insufficient documentation

## 2014-04-23 DIAGNOSIS — R110 Nausea: Secondary | ICD-10-CM | POA: Insufficient documentation

## 2014-04-23 DIAGNOSIS — N23 Unspecified renal colic: Secondary | ICD-10-CM

## 2014-04-23 DIAGNOSIS — M129 Arthropathy, unspecified: Secondary | ICD-10-CM | POA: Insufficient documentation

## 2014-04-23 DIAGNOSIS — Z79899 Other long term (current) drug therapy: Secondary | ICD-10-CM | POA: Insufficient documentation

## 2014-04-23 DIAGNOSIS — Z7982 Long term (current) use of aspirin: Secondary | ICD-10-CM | POA: Insufficient documentation

## 2014-04-23 NOTE — ED Notes (Signed)
Pt brought in by EMS, report of constant abd pain onset ago, states pain similar to hx of kidney calculi. Reports pain eased off but needs to be evaluated.Pt in NAD

## 2014-04-23 NOTE — ED Notes (Signed)
Bed: JQ73 Expected date: 04/23/14 Expected time: 11:35 PM Means of arrival: Ambulance Comments: Flank pain

## 2014-04-24 LAB — URINE MICROSCOPIC-ADD ON

## 2014-04-24 LAB — URINALYSIS, ROUTINE W REFLEX MICROSCOPIC
Bilirubin Urine: NEGATIVE
Glucose, UA: NEGATIVE mg/dL
KETONES UR: NEGATIVE mg/dL
Nitrite: NEGATIVE
PH: 6 (ref 5.0–8.0)
PROTEIN: 30 mg/dL — AB
Specific Gravity, Urine: 1.017 (ref 1.005–1.030)
Urobilinogen, UA: 0.2 mg/dL (ref 0.0–1.0)

## 2014-04-24 MED ORDER — HYDROCODONE-ACETAMINOPHEN 5-325 MG PO TABS: 2.0000 | ORAL_TABLET | ORAL | Status: DC | PRN

## 2014-04-24 MED ORDER — KETOROLAC TROMETHAMINE 10 MG PO TABS: 10.0000 mg | ORAL_TABLET | Freq: Four times a day (QID) | ORAL | Status: DC | PRN

## 2014-04-24 MED ORDER — TAMSULOSIN HCL 0.4 MG PO CAPS: 0.4000 mg | ORAL_CAPSULE | Freq: Every day | ORAL | Status: DC

## 2014-04-24 NOTE — ED Provider Notes (Signed)
CSN: [id number]     Arrival date & time 04/23/14  2353 History   First MD Initiated Contact with Patient 04/23/14 2354     Chief Complaint  Patient presents with  . Abdominal Pain  . Flank Pain     (Consider location/radiation/quality/duration/timing/severity/associated sxs/prior Treatment) HPI Has history of multiple renal stones and is followed closely by urology. He had a sudden onset of left-sided flank and abdominal pain starting roughly 30 minutes prior to arrival. He states this exact same pain as his previous kidney stones. He had mild nausea. His pain is since subsided. He denied any gross hematuria. He's had no fevers or chills. Past Medical History  Diagnosis Date  . Allergy   . Asthma   . GERD (gastroesophageal reflux disease)   . Hypertension   . Arthritis   . Kidney stone    Past Surgical History  Procedure Laterality Date  . Hernia repair    . Cataract extraction     Family History  Problem Relation Age of Onset  . Heart disease Mother   . Hypertension Mother   . Kidney disease Mother   . Heart disease Father   . Hypertension Father    History  Substance Use Topics  . Smoking status: Never Smoker   . Smokeless tobacco: Not on file  . Alcohol Use: No    Review of Systems  Constitutional: Negative for fever and chills.  Gastrointestinal: Positive for abdominal pain.  Genitourinary: Negative for dysuria, frequency and hematuria.  Musculoskeletal: Positive for back pain.  All other systems reviewed and are negative.     Allergies  Review of patient's allergies indicates no known allergies.  Home Medications   Prior to Admission medications   Medication Sig Start Date End Date Taking? Authorizing Provider  aspirin 81 MG EC tablet Take 81 mg by mouth daily.     Yes Historical Provider, MD  fluticasone (FLOVENT HFA) 44 MCG/ACT inhaler Inhale 1 puff into the lungs 2 (two) times daily. 08/27/13  Yes Roderick Pee, MD  ibuprofen (ADVIL,MOTRIN) 200 MG  tablet Take 200 mg by mouth every 6 (six) hours as needed.     Yes Historical Provider, MD  lisinopril-hydrochlorothiazide (ZESTORETIC) 20-12.5 MG per tablet Take 1 tablet by mouth daily. 09/27/13  Yes Roderick Pee, MD  montelukast (SINGULAIR) 10 MG tablet Take 1 tablet (10 mg total) by mouth at bedtime. 08/27/13 08/27/14 Yes Roderick Pee, MD  Multiple Vitamin (MULTIVITAMIN) tablet Take 1 tablet by mouth daily.     Yes Historical Provider, MD  Omega-3 Fatty Acids (FISH OIL) 1200 MG CAPS Take 1 capsule by mouth daily.     Yes Historical Provider, MD  HYDROcodone-acetaminophen (NORCO) 5-325 MG per tablet Take 2 tablets by mouth every 4 (four) hours as needed. 04/24/14   Loren Racer, MD  ketorolac (TORADOL) 10 MG tablet Take 1 tablet (10 mg total) by mouth every 6 (six) hours as needed. 04/24/14   Loren Racer, MD  tamsulosin (FLOMAX) 0.4 MG CAPS capsule Take 1 capsule (0.4 mg total) by mouth daily. 04/24/14   Loren Racer, MD   BP 169/81  Pulse 72  Temp(Src) 97.9 F (36.6 C) (Oral)  SpO2 99% Physical Exam  Nursing note and vitals reviewed. Constitutional: He is oriented to person, place, and time. He appears well-developed and well-nourished. No distress.  HENT:  Head: Normocephalic and atraumatic.  Mouth/Throat: Oropharynx is clear and moist.  Eyes: EOM are normal. Pupils are equal, round, and reactive to  light.  Neck: Normal range of motion. Neck supple.  Cardiovascular: Normal rate and regular rhythm.   Pulmonary/Chest: Effort normal and breath sounds normal. No respiratory distress. He has no wheezes. He has no rales.  Abdominal: Soft. Bowel sounds are normal. He exhibits no distension and no mass. There is no tenderness. There is no rebound and no guarding.  Musculoskeletal: Normal range of motion. He exhibits no edema and no tenderness.  No CVA tenderness.  Neurological: He is alert and oriented to person, place, and time.  Skin: Skin is warm and dry. No rash noted. No  erythema.  Psychiatric: He has a normal mood and affect. His behavior is normal.    ED Course  Procedures (including critical care time) Labs Review Labs Reviewed  URINALYSIS, ROUTINE W REFLEX MICROSCOPIC - Abnormal; Notable for the following:    Hgb urine dipstick MODERATE (*)    Protein, ur 30 (*)    Leukocytes, UA SMALL (*)    All other components within normal limits  URINE MICROSCOPIC-ADD ON - Abnormal; Notable for the following:    Bacteria, UA FEW (*)    Casts HYALINE CASTS (*)    All other components within normal limits    Imaging Review No results found.   EKG Interpretation None      MDM   Final diagnoses:  Renal colic on left side    Given multiple stones in the past with frequent CT scanning, I do not believe that imaging is necessary at this point. Urinalysis without any evidence of infection. Patient remains asymptomatic in the emergency department. We'll give prescriptions for pain medication and advised to followup with his urologist. Return precautions have been given.    Loren Raceravid Mikhaela Zaugg, MD 04/24/14 [phone]

## 2014-04-24 NOTE — Discharge Instructions (Signed)
Kidney Stones  Kidney stones (urolithiasis) are deposits that form inside your kidneys. The intense pain is caused by the stone moving through the urinary tract. When the stone moves, the ureter goes into spasm around the stone. The stone is usually passed in the urine.   CAUSES   · A disorder that makes certain neck glands produce too much parathyroid hormone (primary hyperparathyroidism).  · A buildup of uric acid crystals, similar to gout in your joints.  · Narrowing (stricture) of the ureter.  · A kidney obstruction present at birth (congenital obstruction).  · Previous surgery on the kidney or ureters.  · Numerous kidney infections.  SYMPTOMS   · Feeling sick to your stomach (nauseous).  · Throwing up (vomiting).  · Blood in the urine (hematuria).  · Pain that usually spreads (radiates) to the groin.  · Frequency or urgency of urination.  DIAGNOSIS   · Taking a history and physical exam.  · Blood or urine tests.  · CT scan.  · Occasionally, an examination of the inside of the urinary bladder (cystoscopy) is performed.  TREATMENT   · Observation.  · Increasing your fluid intake.  · Extracorporeal shock wave lithotripsy This is a noninvasive procedure that uses shock waves to break up kidney stones.  · Surgery may be needed if you have severe pain or persistent obstruction. There are various surgical procedures. Most of the procedures are performed with the use of small instruments. Only small incisions are needed to accommodate these instruments, so recovery time is minimized.  The size, location, and chemical composition are all important variables that will determine the proper choice of action for you. Talk to your health care provider to better understand your situation so that you will minimize the risk of injury to yourself and your kidney.   HOME CARE INSTRUCTIONS   · Drink enough water and fluids to keep your urine clear or pale yellow. This will help you to pass the stone or stone fragments.  · Strain  all urine through the provided strainer. Keep all particulate matter and stones for your health care provider to see. The stone causing the pain may be as small as a grain of salt. It is very important to use the strainer each and every time you pass your urine. The collection of your stone will allow your health care provider to analyze it and verify that a stone has actually passed. The stone analysis will often identify what you can do to reduce the incidence of recurrences.  · Only take over-the-counter or prescription medicines for pain, discomfort, or fever as directed by your health care provider.  · Make a follow-up appointment with your health care provider as directed.  · Get follow-up X-rays if required. The absence of pain does not always mean that the stone has passed. It may have only stopped moving. If the urine remains completely obstructed, it can cause loss of kidney function or even complete destruction of the kidney. It is your responsibility to make sure X-rays and follow-ups are completed. Ultrasounds of the kidney can show blockages and the status of the kidney. Ultrasounds are not associated with any radiation and can be performed easily in a matter of minutes.  SEEK MEDICAL CARE IF:  · You experience pain that is progressive and unresponsive to any pain medicine you have been prescribed.  SEEK IMMEDIATE MEDICAL CARE IF:   · Pain cannot be controlled with the prescribed medicine.  · You have a fever   or shaking chills.  · The severity or intensity of pain increases over 18 hours and is not relieved by pain medicine.  · You develop a new onset of abdominal pain.  · You feel faint or pass out.  · You are unable to urinate.  MAKE SURE YOU:   · Understand these instructions.  · Will watch your condition.  · Will get help right away if you are not doing well or get worse.  Document Released: 11/01/2005 Document Revised: 07/04/2013 Document Reviewed: 04/04/2013  [employer]® Patient Information ©2014  [employer].

## 2014-08-29 ENCOUNTER — Ambulatory Visit (INDEPENDENT_AMBULATORY_CARE_PROVIDER_SITE_OTHER): Payer: Medicare Other | Admitting: Family Medicine

## 2014-08-29 ENCOUNTER — Encounter: Payer: Self-pay | Admitting: Family Medicine

## 2014-08-29 VITALS — BP 160/90 | Temp 97.8°F | Ht 64.5 in | Wt 156.0 lb

## 2014-08-29 DIAGNOSIS — I10 Essential (primary) hypertension: Secondary | ICD-10-CM

## 2014-08-29 DIAGNOSIS — J301 Allergic rhinitis due to pollen: Secondary | ICD-10-CM

## 2014-08-29 DIAGNOSIS — G7000 Myasthenia gravis without (acute) exacerbation: Secondary | ICD-10-CM

## 2014-08-29 DIAGNOSIS — J4520 Mild intermittent asthma, uncomplicated: Secondary | ICD-10-CM

## 2014-08-29 DIAGNOSIS — J4521 Mild intermittent asthma with (acute) exacerbation: Secondary | ICD-10-CM

## 2014-08-29 DIAGNOSIS — R351 Nocturia: Secondary | ICD-10-CM

## 2014-08-29 DIAGNOSIS — Z23 Encounter for immunization: Secondary | ICD-10-CM

## 2014-08-29 DIAGNOSIS — N401 Enlarged prostate with lower urinary tract symptoms: Secondary | ICD-10-CM

## 2014-08-29 LAB — POCT URINALYSIS DIPSTICK
Bilirubin, UA: NEGATIVE
Glucose, UA: NEGATIVE
KETONES UA: NEGATIVE
Nitrite, UA: NEGATIVE
SPEC GRAV UA: 1.015
UROBILINOGEN UA: 0.2
pH, UA: 5.5

## 2014-08-29 LAB — HEPATIC FUNCTION PANEL
ALK PHOS: 66 U/L (ref 39–117)
ALT: 30 U/L (ref 0–53)
AST: 40 U/L — ABNORMAL HIGH (ref 0–37)
Albumin: 3.6 g/dL (ref 3.5–5.2)
Bilirubin, Direct: 0.1 mg/dL (ref 0.0–0.3)
TOTAL PROTEIN: 7.1 g/dL (ref 6.0–8.3)
Total Bilirubin: 0.5 mg/dL (ref 0.2–1.2)

## 2014-08-29 LAB — LIPID PANEL
CHOL/HDL RATIO: 5
Cholesterol: 182 mg/dL (ref 0–200)
HDL: 37.5 mg/dL — ABNORMAL LOW (ref >39.00)
LDL CALC: 131 mg/dL — AB (ref 0–99)
NonHDL: 144.5
TRIGLYCERIDES: 67 mg/dL (ref 0.0–149.0)
VLDL: 13.4 mg/dL (ref 0.0–40.0)

## 2014-08-29 LAB — BASIC METABOLIC PANEL
BUN: 15 mg/dL (ref 6–23)
CALCIUM: 10.8 mg/dL — AB (ref 8.4–10.5)
CHLORIDE: 102 meq/L (ref 96–112)
CO2: 29 meq/L (ref 19–32)
Creatinine, Ser: 1.2 mg/dL (ref 0.4–1.5)
GFR: 77 mL/min (ref >60.00)
GLUCOSE: 90 mg/dL (ref 70–99)
Potassium: 4.1 mEq/L (ref 3.5–5.1)
Sodium: 139 mEq/L (ref 135–145)

## 2014-08-29 LAB — CBC WITH DIFFERENTIAL/PLATELET
BASOS PCT: 0.4 % (ref 0.0–3.0)
Basophils Absolute: 0 10*3/uL (ref 0.0–0.1)
Eosinophils Absolute: 0.1 10*3/uL (ref 0.0–0.7)
Eosinophils Relative: 1.9 % (ref 0.0–5.0)
HCT: 43 % (ref 39.0–52.0)
Hemoglobin: 14.3 g/dL (ref 13.0–17.0)
Lymphocytes Relative: 31.7 % (ref 12.0–46.0)
Lymphs Abs: 2 10*3/uL (ref 0.7–4.0)
MCHC: 33.1 g/dL (ref 30.0–36.0)
MCV: 91.6 fl (ref 78.0–100.0)
MONO ABS: 0.6 10*3/uL (ref 0.1–1.0)
Monocytes Relative: 9.3 % (ref 3.0–12.0)
NEUTROS ABS: 3.5 10*3/uL (ref 1.4–7.7)
Neutrophils Relative %: 56.7 % (ref 43.0–77.0)
Platelets: 216 10*3/uL (ref 150.0–400.0)
RBC: 4.7 Mil/uL (ref 4.22–5.81)
RDW: 13.7 % (ref 11.5–15.5)
WBC: 6.2 10*3/uL (ref 4.0–10.5)

## 2014-08-29 LAB — PSA: PSA: 2.6 ng/mL (ref 0.10–4.00)

## 2014-08-29 LAB — TSH: TSH: 1.46 u[IU]/mL (ref 0.35–4.50)

## 2014-08-29 MED ORDER — LISINOPRIL-HYDROCHLOROTHIAZIDE 20-12.5 MG PO TABS: 1.0000 | ORAL_TABLET | Freq: Every day | ORAL | Status: DC

## 2014-08-29 MED ORDER — MONTELUKAST SODIUM 10 MG PO TABS: 10.0000 mg | ORAL_TABLET | Freq: Every day | ORAL | Status: DC

## 2014-08-29 MED ORDER — FLUTICASONE PROPIONATE HFA 44 MCG/ACT IN AERO: 1.0000 | INHALATION_SPRAY | Freq: Two times a day (BID) | RESPIRATORY_TRACT | Status: DC

## 2014-08-29 NOTE — Patient Instructions (Signed)
Continue your current medications  Remember to walk 30 minutes daily  Followup in 1 year sooner if any problems 

## 2014-08-29 NOTE — Progress Notes (Signed)
Pre visit review using our clinic review tool, if applicable. No additional management support is needed unless otherwise documented below in the visit note. 

## 2014-08-29 NOTE — Progress Notes (Signed)
   Subjective:    Patient ID: [name], male    DOB: 05/16/1935, 79 y.o.   MRN: [id number]  HPI Ed is a 79 year old married male nonsmoker who comes in today for evaluation of hypertension and asthma  He takes Zestoretic one daily. BP today here 160/90. Blood pressure at home 145/80.  He also uses Flovent 1 puff twice a day for chronic asthma. He takes an aspirin tablet in the Singul[place] because of allergic rhinitis  He gets routine eye care, dental care, colonoscopy normal in GI 2011. Vaccinations updated by Fleet Contrasachel   Review of Systems  Constitutional: Negative.   HENT: Negative.   Eyes: Negative.   Respiratory: Negative.   Cardiovascular: Negative.   Gastrointestinal: Negative.   Endocrine: Negative.   Genitourinary: Negative.   Musculoskeletal: Negative.   Skin: Negative.   Allergic/Immunologic: Negative.   Neurological: Negative.   Hematological: Negative.   Psychiatric/Behavioral: Negative.        Objective:   Physical Exam  Nursing note and vitals reviewed. Constitutional: He is oriented to person, place, and time. He appears well-developed and well-nourished.  HENT:  Head: Normocephalic and atraumatic.  Right Ear: External ear normal.  Left Ear: External ear normal.  Nose: Nose normal.  Mouth/Throat: Oropharynx is clear and moist.  Eyes: Conjunctivae and EOM are normal. Pupils are equal, round, and reactive to light.  Neck: Normal range of motion. Neck supple. No JVD present. No tracheal deviation present. No thyromegaly present.  Cardiovascular: Normal rate, regular rhythm, normal heart sounds and intact distal pulses.  Exam reveals no gallop and no friction rub.   No murmur heard. No carotid artery bruits peripheral pulses 2+ and symmetrical  Pulmonary/Chest: Effort normal and breath sounds normal. No stridor. No respiratory distress. He has no wheezes. He has no rales. He exhibits no tenderness.  Abdominal: Soft. Bowel sounds are normal. He exhibits no  distension and no mass. There is no tenderness. There is no rebound and no guarding.  Genitourinary: Rectum normal and penis normal. Guaiac negative stool. No penile tenderness.  1+ symmetrical nonnodular BPH   Musculoskeletal: Normal range of motion. He exhibits no edema and no tenderness.  Lymphadenopathy:    He has no cervical adenopathy.  Neurological: He is alert and oriented to person, place, and time. He has normal reflexes. No cranial nerve deficit. He exhibits normal muscle tone.  Skin: Skin is warm and dry. No rash noted. No erythema. No pallor.  Psychiatric: He has a normal mood and affect. His behavior is normal. Judgment and thought content normal.          Assessment & Plan:  Healthy male  Hypertension at goal continue current therapy  BPH asymptomatic  Allergic rhinitis continue current medication  Chronic asthma under good control with Flovent one puff twice a day continue  Followup in 1 year sooner if any problems

## 2014-12-02 ENCOUNTER — Telehealth: Payer: Self-pay | Admitting: *Deleted

## 2014-12-02 MED ORDER — LISINOPRIL-HYDROCHLOROTHIAZIDE 10-12.5 MG PO TABS: 1.0000 | ORAL_TABLET | Freq: Every day | ORAL | Status: DC

## 2014-12-02 NOTE — Telephone Encounter (Signed)
rx sent

## 2014-12-27 MED ORDER — LISINOPRIL-HYDROCHLOROTHIAZIDE 10-12.5 MG PO TABS: 1.0000 | ORAL_TABLET | Freq: Every day | ORAL | Status: DC

## 2014-12-27 NOTE — Telephone Encounter (Signed)
Rachel please re send rx lisinopril pharm did not received

## 2014-12-27 NOTE — Addendum Note (Signed)
Addended by: Kern ReapVEREEN, Angle Karel B on: 12/27/2014 10:33 AM   Modules accepted: Orders

## 2015-03-13 ENCOUNTER — Emergency Department (HOSPITAL_COMMUNITY)
Admission: EM | Admit: 2015-03-13 | Discharge: 2015-03-13 | Disposition: A | Discharge status: Home / Self Care | Payer: Medicare Other | Attending: Emergency Medicine | Admitting: Emergency Medicine

## 2015-03-13 ENCOUNTER — Emergency Department (HOSPITAL_COMMUNITY): Payer: Medicare Other

## 2015-03-13 ENCOUNTER — Encounter (HOSPITAL_COMMUNITY): Payer: Self-pay

## 2015-03-13 DIAGNOSIS — M1990 Unspecified osteoarthritis, unspecified site: Secondary | ICD-10-CM | POA: Insufficient documentation

## 2015-03-13 DIAGNOSIS — R109 Unspecified abdominal pain: Secondary | ICD-10-CM | POA: Diagnosis present

## 2015-03-13 DIAGNOSIS — Z8719 Personal history of other diseases of the digestive system: Secondary | ICD-10-CM | POA: Diagnosis not present

## 2015-03-13 DIAGNOSIS — J45909 Unspecified asthma, uncomplicated: Secondary | ICD-10-CM | POA: Diagnosis not present

## 2015-03-13 DIAGNOSIS — Z7982 Long term (current) use of aspirin: Secondary | ICD-10-CM | POA: Diagnosis not present

## 2015-03-13 DIAGNOSIS — I10 Essential (primary) hypertension: Secondary | ICD-10-CM | POA: Diagnosis not present

## 2015-03-13 DIAGNOSIS — R001 Bradycardia, unspecified: Secondary | ICD-10-CM | POA: Diagnosis not present

## 2015-03-13 DIAGNOSIS — N201 Calculus of ureter: Secondary | ICD-10-CM | POA: Insufficient documentation

## 2015-03-13 DIAGNOSIS — Z7951 Long term (current) use of inhaled steroids: Secondary | ICD-10-CM | POA: Diagnosis not present

## 2015-03-13 DIAGNOSIS — Z79899 Other long term (current) drug therapy: Secondary | ICD-10-CM | POA: Insufficient documentation

## 2015-03-13 LAB — CBC WITH DIFFERENTIAL/PLATELET
Basophils Absolute: 0 10*3/uL (ref 0.0–0.1)
Basophils Relative: 0 % (ref 0–1)
Eosinophils Absolute: 0.1 10*3/uL (ref 0.0–0.7)
Eosinophils Relative: 2 % (ref 0–5)
HCT: 39.3 % (ref 39.0–52.0)
Hemoglobin: 13.3 g/dL (ref 13.0–17.0)
Lymphocytes Relative: 15 % (ref 12–46)
Lymphs Abs: 1.1 10*3/uL (ref 0.7–4.0)
MCH: 30.4 pg (ref 26.0–34.0)
MCHC: 33.8 g/dL (ref 30.0–36.0)
MCV: 89.9 fL (ref 78.0–100.0)
Monocytes Absolute: 0.5 10*3/uL (ref 0.1–1.0)
Monocytes Relative: 7 % (ref 3–12)
Neutro Abs: 5.5 10*3/uL (ref 1.7–7.7)
Neutrophils Relative %: 76 % (ref 43–77)
Platelets: 198 10*3/uL (ref 150–400)
RBC: 4.37 MIL/uL (ref 4.22–5.81)
RDW: 13.1 % (ref 11.5–15.5)
WBC: 7.2 10*3/uL (ref 4.0–10.5)

## 2015-03-13 LAB — URINALYSIS, ROUTINE W REFLEX MICROSCOPIC
Bilirubin Urine: NEGATIVE
Glucose, UA: NEGATIVE mg/dL
Ketones, ur: NEGATIVE mg/dL
Nitrite: NEGATIVE
Protein, ur: 30 mg/dL — AB
Specific Gravity, Urine: 1.016 (ref 1.005–1.030)
Urobilinogen, UA: 0.2 mg/dL (ref 0.0–1.0)
pH: 7 (ref 5.0–8.0)

## 2015-03-13 LAB — BASIC METABOLIC PANEL
Anion gap: 4 — ABNORMAL LOW (ref 5–15)
BUN: 11 mg/dL (ref 6–23)
CO2: 30 mmol/L (ref 19–32)
Calcium: 10.7 mg/dL — ABNORMAL HIGH (ref 8.4–10.5)
Chloride: 103 mmol/L (ref 96–112)
Creatinine, Ser: 1.11 mg/dL (ref 0.50–1.35)
GFR calc Af Amer: 72 mL/min — ABNORMAL LOW (ref >90)
GFR calc non Af Amer: 62 mL/min — ABNORMAL LOW (ref >90)
Glucose, Bld: 104 mg/dL — ABNORMAL HIGH (ref 70–99)
Potassium: 3.9 mmol/L (ref 3.5–5.1)
Sodium: 137 mmol/L (ref 135–145)

## 2015-03-13 LAB — URINE MICROSCOPIC-ADD ON

## 2015-03-13 LAB — I-STAT CG4 LACTIC ACID, ED: Lactic Acid, Venous: 0.81 mmol/L (ref 0.5–2.0)

## 2015-03-13 MED ORDER — DEXTROSE 5 % IV SOLN
1.0000 g | Freq: Once | INTRAVENOUS | Status: AC
  Administered 2015-03-13: 1 g via INTRAVENOUS
  Filled 2015-03-13: qty 10

## 2015-03-13 MED ORDER — TAMSULOSIN HCL 0.4 MG PO CAPS: 0.4000 mg | ORAL_CAPSULE | Freq: Every day | ORAL | Status: DC

## 2015-03-13 MED ORDER — CIPROFLOXACIN HCL 500 MG PO TABS: 500.0000 mg | ORAL_TABLET | Freq: Two times a day (BID) | ORAL | Status: DC

## 2015-03-13 MED ORDER — OXYCODONE-ACETAMINOPHEN 5-325 MG PO TABS: 1.0000 | ORAL_TABLET | Freq: Four times a day (QID) | ORAL | Status: DC | PRN

## 2015-03-13 NOTE — ED Provider Notes (Signed)
CSN: [id number]     Arrival date & time    History   First MD Initiated Contact with Patient 03/13/15 [phone]     Chief Complaint  Patient presents with  . Abdominal Pain     (Consider location/radiation/quality/duration/timing/severity/associated sxs/prior Treatment) HPI   [name] is a 80 yo male with HTN, Asthma and hx of several kidney stones who presents to the ED with left flank pain. Wife is at bedside. He describes the pain as "shooting" and says it goes from his left upper abdomen/flank around to his left back. The pain started 2 hours ago at home after he had his coffee. He says the pain was 10/10 for about 40 minutes and he vomited x1 for about 5 minutes. He says the pain then subsided to 6/10. Currently pain has fully resolved. He did not take anything for the pain. EMS gave Aspirin 325 en route. He denies nausea, diarrhea, constipation. He denies chest pain, SOB, fatigue, fever, chills, weakness, dizziness, syncope, headache.   He says he had one similar episode in the past with same pain characteristics. He went to the ED then and pain had fully resolved. States he had a full workup that was negative.   Past Medical History  Diagnosis Date  . Allergy   . Asthma   . GERD (gastroesophageal reflux disease)   . Hypertension   . Arthritis   . Kidney stone    Past Surgical History  Procedure Laterality Date  . Hernia repair    . Cataract extraction    . Kidney stone surgery     Family History  Problem Relation Age of Onset  . Heart disease Mother   . Hypertension Mother   . Kidney disease Mother   . Heart disease Father   . Hypertension Father    History  Substance Use Topics  . Smoking status: Never Smoker   . Smokeless tobacco: Not on file  . Alcohol Use: No    Review of Systems All other systems negative except as documented in the HPI. All pertinent positives and negatives as reviewed in the HPI.    Allergies  Review of patient's allergies indicates  no known allergies.  Home Medications   Prior to Admission medications   Medication Sig Start Date End Date Taking? Authorizing Provider  aspirin 81 MG EC tablet Take 81 mg by mouth daily.      Historical Provider, MD  fluticasone (FLOVENT HFA) 44 MCG/ACT inhaler Inhale 1 puff into the lungs 2 (two) times daily. 08/29/14   Roderick Pee, MD  HYDROcodone-acetaminophen (NORCO) 5-325 MG per tablet Take 2 tablets by mouth every 4 (four) hours as needed. 04/24/14   Loren Racer, MD  ibuprofen (ADVIL,MOTRIN) 200 MG tablet Take 200 mg by mouth every 6 (six) hours as needed.      Historical Provider, MD  ketorolac (TORADOL) 10 MG tablet Take 1 tablet (10 mg total) by mouth every 6 (six) hours as needed. 04/24/14   Loren Racer, MD  lisinopril-hydrochlorothiazide (PRINZIDE,ZESTORETIC) 10-12.5 MG per tablet Take 1 tablet by mouth daily. 12/27/14   Roderick Pee, MD  montelukast (SINGULAIR) 10 MG tablet Take 1 tablet (10 mg total) by mouth at bedtime. 08/29/14 08/29/15  Roderick Pee, MD  Multiple Vitamin (MULTIVITAMIN) tablet Take 1 tablet by mouth daily.      Historical Provider, MD  Omega-3 Fatty Acids (FISH OIL) 1200 MG CAPS Take 1 capsule by mouth daily.      Historical Provider,  MD   BP 154/68 mmHg  Pulse 56  Temp(Src) 98.8 F (37.1 C) (Oral)  Resp 11  SpO2 100% Physical Exam  Constitutional: He is oriented to person, place, and time. He appears well-developed and well-nourished.  HENT:  Head: Normocephalic and atraumatic.  Mouth/Throat: Oropharynx is clear and moist and mucous membranes are normal.  Eyes: Pupils are equal, round, and reactive to light.  Neck: Normal range of motion. Neck supple. No thyromegaly present.  Cardiovascular: Regular rhythm, S1 normal, S2 normal and normal heart sounds.  Bradycardia present.  Exam reveals no gallop and no friction rub.   No murmur heard. Pulmonary/Chest: Effort normal and breath sounds normal.  Abdominal: Soft. Normal appearance and bowel  sounds are normal. There is no hepatosplenomegaly. There is no tenderness. There is no guarding and no CVA tenderness.  Musculoskeletal: He exhibits no edema.  Neurological: He is alert and oriented to person, place, and time. He exhibits normal muscle tone. Coordination normal.  Skin: Skin is warm and dry.  Psychiatric: He has a normal mood and affect. His behavior is normal.  Nursing note and vitals reviewed.   ED Course  Procedures (including critical care time) Labs Review Labs Reviewed  URINALYSIS, ROUTINE W REFLEX MICROSCOPIC  BASIC METABOLIC PANEL  CBC WITH DIFFERENTIAL/PLATELET    Imaging Review No results found.   EKG Interpretation   Date/Time:  Thursday March 13 2015 09:17:44 EDT Ventricular Rate:  54 PR Interval:  221 QRS Duration: 90 QT Interval:  427 QTC Calculation: 405 R Axis:   -30 Text Interpretation:  Sinus rhythm Prolonged PR interval Left axis  deviation Anterior infarct, old Confirmed by Lincoln Brighamees, Liz [phone]([id number]) on  03/13/2015 9:21:52 AM      I spoke with Dr. [name] of urology and he would like to follow up the patient in his office.  The patient is currently pain-free.  He will be prescribed ciprofloxacin.  Told to increase his in take and return here for any worsening in his condition.  Patient agrees the plan and all questions were answered.  He has been pain-free since being in the emergency department   Brian Nighthristopher Xaden Kaufman, PA-C 03/14/15 1647  Tilden FossaElizabeth Rees, MD 03/14/15 [phone]

## 2015-03-13 NOTE — ED Notes (Signed)
Pt. Is from home. Complaint of sharp abd pain radiating to L back for approx. 1 hour with approx. 5 min of continuous vomiting. Pain 6/10 on EMS arrival. EMS noted elevation in 2,3,4 with 1 degree AVB. Pt. Denies pain/nausea/vomiting at this time. EMS administered 324 ASA. Denies CP/SOB.

## 2015-03-13 NOTE — Discharge Instructions (Signed)
Called the urologist office today for an appointment for tomorrow.  Return here as needed for any worsening in your condition

## 2015-03-15 LAB — URINE CULTURE
COLONY COUNT: NO GROWTH
Culture: NO GROWTH

## 2015-03-17 ENCOUNTER — Other Ambulatory Visit: Payer: Self-pay | Admitting: Urology

## 2015-03-18 ENCOUNTER — Encounter: Payer: Self-pay | Admitting: Family Medicine

## 2015-03-18 ENCOUNTER — Ambulatory Visit (INDEPENDENT_AMBULATORY_CARE_PROVIDER_SITE_OTHER): Payer: Medicare Other | Admitting: Family Medicine

## 2015-03-18 ENCOUNTER — Encounter (HOSPITAL_BASED_OUTPATIENT_CLINIC_OR_DEPARTMENT_OTHER): Payer: Self-pay | Admitting: *Deleted

## 2015-03-18 VITALS — BP 120/78 | Temp 98.2°F | Wt 153.0 lb

## 2015-03-18 DIAGNOSIS — N200 Calculus of kidney: Secondary | ICD-10-CM | POA: Diagnosis not present

## 2015-03-18 DIAGNOSIS — Z87440 Personal history of urinary (tract) infections: Secondary | ICD-10-CM

## 2015-03-18 LAB — POCT URINALYSIS DIPSTICK
Bilirubin, UA: NEGATIVE
Glucose, UA: NEGATIVE
KETONES UA: NEGATIVE
Leukocytes, UA: NEGATIVE
Nitrite, UA: NEGATIVE
PH UA: 5
PROTEIN UA: NEGATIVE
Urobilinogen, UA: 0.2

## 2015-03-18 NOTE — Progress Notes (Signed)
   Subjective:    Patient ID: [name], male    DOB: 02/24/1935, 80 y.o.   MRN: [id number]  HPI Ed is a 80 year old married male nonsmoker who comes in today for follow-up of kidney stones  He's had this problem the past. His urologist is Dr. Vladimir FasterMark O at the urology center. He went the emergency room on April 28 was severe pain. Scan showed left-sided stones. There was question obstruction. Pain resolved rather quickly and he was discharged on oral antibiotics Cipro 500 twice a day. He comes in today says he feels well as urine seems normal no fever chills or back pain. Appointment at urology next Monday   Review of Systems    review of systems otherwise negative Objective:   Physical Exam  Well-developed well-nourished male no acute distress vital signs stable he is afebrile urinalysis normal      Assessment & Plan:  History of recurrent kidney stones.......... decrease Cipro to 1 daily.......... follow-up in urology as outlined with Dr. De NurseMarco..

## 2015-03-18 NOTE — Progress Notes (Signed)
Pre visit review using our clinic review tool, if applicable. No additional management support is needed unless otherwise documented below in the visit note. 

## 2015-03-18 NOTE — Progress Notes (Addendum)
NPO AFTER MN.  ARRIVE AT 0830.  NEEDS KUB.  CURRENT LAB RESULTS AND EKG IN CHART AND EPIC.  WILL DO FLOVENT INHALER AM DOS W/ SIPS OF WATER AND IF NEEDED TAKE OXYCODONE.

## 2015-03-18 NOTE — Patient Instructions (Signed)
Cipro 500 mg.............. take only 1 daily in the morning  Drink lots of water  Follow-up with urology as outlined.

## 2015-03-24 ENCOUNTER — Ambulatory Visit (HOSPITAL_BASED_OUTPATIENT_CLINIC_OR_DEPARTMENT_OTHER): Payer: Medicare Other | Admitting: Anesthesiology

## 2015-03-24 ENCOUNTER — Ambulatory Visit (HOSPITAL_BASED_OUTPATIENT_CLINIC_OR_DEPARTMENT_OTHER)
Admission: RE | Admit: 2015-03-24 | Discharge: 2015-03-24 | Disposition: A | Discharge status: Home / Self Care | Payer: Medicare Other | Source: Ambulatory Visit | Attending: Urology | Admitting: Urology

## 2015-03-24 ENCOUNTER — Encounter (HOSPITAL_BASED_OUTPATIENT_CLINIC_OR_DEPARTMENT_OTHER): Payer: Self-pay | Admitting: Anesthesiology

## 2015-03-24 ENCOUNTER — Encounter (HOSPITAL_BASED_OUTPATIENT_CLINIC_OR_DEPARTMENT_OTHER)
Admission: RE | Disposition: A | Discharge status: Home / Self Care | Payer: Self-pay | Source: Ambulatory Visit | Attending: Urology

## 2015-03-24 ENCOUNTER — Ambulatory Visit (HOSPITAL_COMMUNITY): Payer: Medicare Other

## 2015-03-24 DIAGNOSIS — K219 Gastro-esophageal reflux disease without esophagitis: Secondary | ICD-10-CM | POA: Diagnosis not present

## 2015-03-24 DIAGNOSIS — J45909 Unspecified asthma, uncomplicated: Secondary | ICD-10-CM | POA: Insufficient documentation

## 2015-03-24 DIAGNOSIS — N201 Calculus of ureter: Secondary | ICD-10-CM | POA: Insufficient documentation

## 2015-03-24 DIAGNOSIS — N359 Urethral stricture, unspecified: Secondary | ICD-10-CM | POA: Diagnosis not present

## 2015-03-24 DIAGNOSIS — Z792 Long term (current) use of antibiotics: Secondary | ICD-10-CM | POA: Insufficient documentation

## 2015-03-24 DIAGNOSIS — Z87891 Personal history of nicotine dependence: Secondary | ICD-10-CM | POA: Insufficient documentation

## 2015-03-24 DIAGNOSIS — Z791 Long term (current) use of non-steroidal anti-inflammatories (NSAID): Secondary | ICD-10-CM | POA: Insufficient documentation

## 2015-03-24 DIAGNOSIS — I10 Essential (primary) hypertension: Secondary | ICD-10-CM | POA: Insufficient documentation

## 2015-03-24 DIAGNOSIS — Z7951 Long term (current) use of inhaled steroids: Secondary | ICD-10-CM | POA: Diagnosis not present

## 2015-03-24 DIAGNOSIS — Z7982 Long term (current) use of aspirin: Secondary | ICD-10-CM | POA: Diagnosis not present

## 2015-03-24 DIAGNOSIS — Z79891 Long term (current) use of opiate analgesic: Secondary | ICD-10-CM | POA: Diagnosis not present

## 2015-03-24 DIAGNOSIS — N200 Calculus of kidney: Secondary | ICD-10-CM | POA: Insufficient documentation

## 2015-03-24 DIAGNOSIS — M1990 Unspecified osteoarthritis, unspecified site: Secondary | ICD-10-CM | POA: Diagnosis not present

## 2015-03-24 DIAGNOSIS — N289 Disorder of kidney and ureter, unspecified: Secondary | ICD-10-CM | POA: Insufficient documentation

## 2015-03-24 DIAGNOSIS — I739 Peripheral vascular disease, unspecified: Secondary | ICD-10-CM | POA: Diagnosis not present

## 2015-03-24 DIAGNOSIS — Z79899 Other long term (current) drug therapy: Secondary | ICD-10-CM | POA: Diagnosis not present

## 2015-03-24 DIAGNOSIS — Z9889 Other specified postprocedural states: Secondary | ICD-10-CM | POA: Insufficient documentation

## 2015-03-24 HISTORY — DX: Disorder of arteries and arterioles, unspecified: I77.9

## 2015-03-24 HISTORY — DX: Benign prostatic hyperplasia without lower urinary tract symptoms: N40.0

## 2015-03-24 HISTORY — DX: Peripheral vascular disease, unspecified: I73.9

## 2015-03-24 HISTORY — DX: Personal history of urinary calculi: Z87.442

## 2015-03-24 HISTORY — DX: Calculus of kidney: N20.0

## 2015-03-24 HISTORY — PX: CYSTOSCOPY W/ RETROGRADES: SHX1426

## 2015-03-24 HISTORY — DX: Other nonspecific abnormal finding of lung field: R91.8

## 2015-03-24 HISTORY — PX: CYSTOSCOPY WITH URETEROSCOPY: SHX5123

## 2015-03-24 HISTORY — PX: HOLMIUM LASER APPLICATION: SHX5852

## 2015-03-24 HISTORY — PX: CYSTOSCOPY WITH STENT PLACEMENT: SHX5790

## 2015-03-24 HISTORY — PX: STONE EXTRACTION WITH BASKET: SHX5318

## 2015-03-24 SURGERY — CYSTOSCOPY, WITH RETROGRADE PYELOGRAM
Anesthesia: General | Site: Ureter | Laterality: Right

## 2015-03-24 MED ORDER — PHENAZOPYRIDINE HCL 200 MG PO TABS
200.0000 mg | ORAL_TABLET | Freq: Once | ORAL | Status: AC
  Administered 2015-03-24: 200 mg via ORAL
  Filled 2015-03-24: qty 1

## 2015-03-24 MED ORDER — PROPOFOL 10 MG/ML IV BOLUS
INTRAVENOUS | Status: DC | PRN
  Administered 2015-03-24: 50 mg via INTRAVENOUS
  Administered 2015-03-24: 150 mg via INTRAVENOUS

## 2015-03-24 MED ORDER — CIPROFLOXACIN IN D5W 400 MG/200ML IV SOLN
INTRAVENOUS | Status: AC
  Filled 2015-03-24: qty 200

## 2015-03-24 MED ORDER — PHENAZOPYRIDINE HCL 100 MG PO TABS
ORAL_TABLET | ORAL | Status: AC
  Filled 2015-03-24: qty 2

## 2015-03-24 MED ORDER — TAMSULOSIN HCL 0.4 MG PO CAPS
ORAL_CAPSULE | ORAL | Status: AC
  Filled 2015-03-24: qty 1

## 2015-03-24 MED ORDER — TAMSULOSIN HCL 0.4 MG PO CAPS
0.4000 mg | ORAL_CAPSULE | Freq: Once | ORAL | Status: AC
  Administered 2015-03-24: 0.4 mg via ORAL
  Filled 2015-03-24: qty 1

## 2015-03-24 MED ORDER — LACTATED RINGERS IV SOLN
INTRAVENOUS | Status: DC
  Administered 2015-03-24 (×2): via INTRAVENOUS
  Filled 2015-03-24: qty 1000

## 2015-03-24 MED ORDER — ACETAMINOPHEN 10 MG/ML IV SOLN
INTRAVENOUS | Status: DC | PRN
  Administered 2015-03-24: 1000 mg via INTRAVENOUS

## 2015-03-24 MED ORDER — MIDAZOLAM HCL 5 MG/5ML IJ SOLN
INTRAMUSCULAR | Status: DC | PRN
  Administered 2015-03-24: 2 mg via INTRAVENOUS

## 2015-03-24 MED ORDER — SUCCINYLCHOLINE CHLORIDE 20 MG/ML IJ SOLN
INTRAMUSCULAR | Status: DC | PRN
  Administered 2015-03-24: 100 mg via INTRAVENOUS

## 2015-03-24 MED ORDER — LIDOCAINE HCL (CARDIAC) 20 MG/ML IV SOLN
INTRAVENOUS | Status: DC | PRN
  Administered 2015-03-24: 70 mg via INTRAVENOUS

## 2015-03-24 MED ORDER — CIPROFLOXACIN IN D5W 200 MG/100ML IV SOLN
200.0000 mg | INTRAVENOUS | Status: AC
  Administered 2015-03-24: 200 mg via INTRAVENOUS
  Filled 2015-03-24: qty 100

## 2015-03-24 MED ORDER — IOHEXOL 350 MG/ML SOLN
INTRAVENOUS | Status: DC | PRN
  Administered 2015-03-24: 15 mL

## 2015-03-24 MED ORDER — ATROPINE SULFATE 0.4 MG/ML IJ SOLN
INTRAMUSCULAR | Status: DC | PRN
  Administered 2015-03-24: 0.4 mg via INTRAVENOUS

## 2015-03-24 MED ORDER — SODIUM CHLORIDE 0.9 % IR SOLN
Status: DC | PRN
  Administered 2015-03-24: 4000 mL

## 2015-03-24 MED ORDER — OXYCODONE HCL 10 MG PO TABS: 10.0000 mg | ORAL_TABLET | ORAL | Status: DC | PRN

## 2015-03-24 MED ORDER — HYDROCODONE-ACETAMINOPHEN 7.5-325 MG PO TABS
1.0000 | ORAL_TABLET | Freq: Once | ORAL | Status: DC | PRN
  Filled 2015-03-24: qty 1

## 2015-03-24 MED ORDER — CIPROFLOXACIN IN D5W 200 MG/100ML IV SOLN
INTRAVENOUS | Status: AC
  Filled 2015-03-24: qty 100

## 2015-03-24 MED ORDER — FENTANYL CITRATE (PF) 100 MCG/2ML IJ SOLN
INTRAMUSCULAR | Status: AC
  Filled 2015-03-24: qty 4

## 2015-03-24 MED ORDER — ONDANSETRON HCL 4 MG/2ML IJ SOLN
INTRAMUSCULAR | Status: DC | PRN
  Administered 2015-03-24: 4 mg via INTRAVENOUS

## 2015-03-24 MED ORDER — PHENAZOPYRIDINE HCL 200 MG PO TABS: 200.0000 mg | ORAL_TABLET | Freq: Three times a day (TID) | ORAL | Status: DC | PRN

## 2015-03-24 MED ORDER — FENTANYL CITRATE (PF) 100 MCG/2ML IJ SOLN
INTRAMUSCULAR | Status: DC | PRN
  Administered 2015-03-24: 50 ug via INTRAVENOUS

## 2015-03-24 MED ORDER — DEXAMETHASONE SODIUM PHOSPHATE 4 MG/ML IJ SOLN
INTRAMUSCULAR | Status: DC | PRN
  Administered 2015-03-24: 10 mg via INTRAVENOUS

## 2015-03-24 MED ORDER — MIDAZOLAM HCL 2 MG/2ML IJ SOLN
INTRAMUSCULAR | Status: AC
  Filled 2015-03-24: qty 2

## 2015-03-24 MED ORDER — HYDROMORPHONE HCL 1 MG/ML IJ SOLN
0.2500 mg | INTRAMUSCULAR | Status: DC | PRN
  Filled 2015-03-24: qty 1

## 2015-03-24 SURGICAL SUPPLY — 25 items
BAG DRAIN URO-CYSTO SKYTR STRL (DRAIN) ×4 IMPLANT
BAG DRN UROCATH (DRAIN) ×3
BASKET ZERO TIP NITINOL 2.4FR (BASKET) ×2 IMPLANT
BSKT STON RTRVL ZERO TP 2.4FR (BASKET) ×3
CANISTER SUCT LVC 12 LTR MEDI- (MISCELLANEOUS) ×2 IMPLANT
CATH INTERMIT  6FR 70CM (CATHETERS) ×2 IMPLANT
CLOTH BEACON ORANGE TIMEOUT ST (SAFETY) ×4 IMPLANT
FIBER LASER TRAC TIP (UROLOGICAL SUPPLIES) ×2 IMPLANT
GLOVE BIO SURGEON STRL SZ8 (GLOVE) ×4 IMPLANT
GLOVE BIOGEL PI IND STRL 6.5 (GLOVE) ×2 IMPLANT
GLOVE BIOGEL PI IND STRL 7.5 (GLOVE) ×1 IMPLANT
GLOVE BIOGEL PI INDICATOR 6.5 (GLOVE) ×2
GLOVE BIOGEL PI INDICATOR 7.5 (GLOVE) ×1
GLOVE SURG SS PI 7.5 STRL IVOR (GLOVE) ×2 IMPLANT
GOWN STRL REUS W/ TWL LRG LVL3 (GOWN DISPOSABLE) ×2 IMPLANT
GOWN STRL REUS W/ TWL XL LVL3 (GOWN DISPOSABLE) ×2 IMPLANT
GOWN STRL REUS W/TWL LRG LVL3 (GOWN DISPOSABLE) ×2 IMPLANT
GOWN STRL REUS W/TWL XL LVL3 (GOWN DISPOSABLE) ×2 IMPLANT
GUIDEWIRE STR DUAL SENSOR (WIRE) ×4 IMPLANT
IV NS 1000ML (IV SOLUTION) ×4
IV NS 1000ML BAXH (IV SOLUTION) ×1 IMPLANT
IV NS IRRIG 3000ML ARTHROMATIC (IV SOLUTION) ×6 IMPLANT
PACK CYSTO (CUSTOM PROCEDURE TRAY) ×4 IMPLANT
SHEATH ACCESS URETERAL 38CM (SHEATH) ×2 IMPLANT
STENT URET 6FRX26 CONTOUR (STENTS) ×2 IMPLANT

## 2015-03-24 NOTE — Op Note (Signed)
PATIENT:  [name]  PRE-OPERATIVE DIAGNOSIS: 1.  right Ureteral calculus 2. Left renal calculus 3. Mild urethral stricture  POST-OPERATIVE DIAGNOSIS: 1. Right ureteral calculi. 2. Left renal calculus. 3. Mild urethral stricture  PROCEDURE:  1. Cystoscopy with dilation of urethral stricture 2. Bilateral retrograde pyelograms with interpretation. 3. Right ureteroscopy with laser lithotripsy. 4. Right ureteral stone extraction. 5. Right double-J stent placement  SURGEON: Garnett FarmMark C Secily Walthour, MD  INDICATION: Mr. Lyn Hollingsheadlexander is a 80 year old male who was found to have right ureteral calculi on a CT scan performed for left flank pain. He was found to have a 5 mm and a 10 mm stone in his right ureter at the level of the iliac artery causing partial obstruction of the right kidney as well as a stone within the left renal pelvis. The left renal pelvic stone could be seen on preoperative KUB the day of his surgery but the right ureteral calculi could not be easily visualized. We discussed the treatment options and he elected to proceed with management of the partially obstructing right ureteral calculi, retrograde pyelography on both sides to assess for obstruction or other ureteral stones especially on the left and treatment of his right ureteral stones.  ANESTHESIA:  General  EBL:  Minimal  DRAINS: 6 French, 24 cm double-J stent in the right ureter (with string)  SPECIMEN:  Stone taken to my office for composition analysis.  DESCRIPTION OF PROCEDURE: The patient was taken to the major OR and placed on the table. General anesthesia was administered and then the patient was moved to the dorsal lithotomy position. The genitalia was sterilely prepped and draped. An official timeout was performed.  Initially the 21 French cystoscope with 12 lens was passed under direct vision. The bladder was then entered and fully inspected. It was noted be free of any tumors stones or inflammatory lesions.  Ureteral orifices were of normal configuration and position. A 6 French open-ended ureteral catheter was then passed through the cystoscope into the ureteral orifice in order to perform a left retrograde pyelogram.  A retrograde pyelogram was performed by injecting full-strength contrast up the left ureter under direct fluoroscopic control. It revealed a filling defect in the left renal pelvis consistent with the stone seen on the preoperative KUB. The remainder of the ureter was noted to be normal as was the intrarenal collecting system.  I then performed a right retrograde pyelogram in an identical fashion. This revealed a filling defect in the mid right ureter consistent with his known mid ureteral stones. The remainder of the ureter and collecting system was noted to be normal on this side as well. I then passed a 0.038 inch floppy-tipped guidewire through the open ended catheter and into the area of the renal pelvis and this was left in place. The inner portion of a ureteral access sheath was then passed over the guidewire to gently dilate the intramural ureter. I then proceeded with ureteroscopy.  A 5 French rigid ureteroscope was then passed under direct into the bladder and into the right orifice and up the ureter. The stone was identified and I felt it was too large to extract and therefore elected to proceed with laser lithotripsy. The 200  holmium laser fiber was used to fragment the stone. I then used the nitinol basket to extract all of the stone fragments and reinspection of the ureter ureteroscopically revealed no further stone fragments and no injury to the ureter. I then backloaded the cystoscope over the guidewire and  passed the stent over the guidewire into the area of the renal pelvis. As the guidewire was removed good curl was noted in the renal pelvis. The bladder was drained and the cystoscope was then removed. The patient tolerated the procedure well no intraoperative complications.    His left renal pelvic stone is likely causing intermittent obstruction at the UPJ. We discussed treating this with lithotripsy in the near future.  PLAN OF CARE: Discharge to home after PACU  PATIENT DISPOSITION:  PACU - hemodynamically stable.

## 2015-03-24 NOTE — Anesthesia Postprocedure Evaluation (Signed)
  Anesthesia Post-op Note  Patient: Anselm Lisdward L Diefendorf  Procedure(s) Performed: Procedure(s): CYSTOSCOPY WITH RETROGRADE PYELOGRAM (Bilateral) CYSTOSCOPY WITH URETEROSCOPY (Right) CYSTOSCOPY WITH STENT PLACEMENT (Right) HOLMIUM LASER APPLICATION (Right) STONE EXTRACTION WITH BASKET (Right)  Patient Location: PACU  Anesthesia Type:General  Level of Consciousness: awake and alert   Airway and Oxygen Therapy: Patient Spontanous Breathing  Post-op Pain: none  Post-op Assessment: Post-op Vital signs reviewed  Post-op Vital Signs: Reviewed  Last Vitals:  Filed Vitals:   03/24/15 1304  BP: 175/71  Pulse: 47  Temp: 36.4 C  Resp: 16    Complications: No apparent anesthesia complications

## 2015-03-24 NOTE — Anesthesia Preprocedure Evaluation (Addendum)
Anesthesia Evaluation  Patient identified by MRN, date of birth, ID band Patient awake    Reviewed: Allergy & Precautions, NPO status , Patient's Chart, lab work & pertinent test results  Airway Mallampati: I  TM Distance: >3 FB Neck ROM: Full    Dental  (+) Dental Advisory Given, Lower Dentures, Upper Dentures   Pulmonary asthma ,  breath sounds clear to auscultation        Cardiovascular hypertension, Pt. on medications + Peripheral Vascular Disease Rhythm:Regular Rate:Normal     Neuro/Psych Hx of unilateral eyelid and forehead weakness. EMG report of "mild myasthenia". No antibodies found. On no steroids or immunosuppressants for myasthenia. Reports no weakness since episode 8 yrs ago.  negative neurological ROS     GI/Hepatic Neg liver ROS, GERD-  ,  Endo/Other  negative endocrine ROS  Renal/GU Renal InsufficiencyRenal diseaseRenal stones     Musculoskeletal  (+) Arthritis -,   Abdominal   Peds  Hematology negative hematology ROS (+)   Anesthesia Other Findings   Reproductive/Obstetrics                           Anesthesia Physical Anesthesia Plan  ASA: III  Anesthesia Plan: General   Post-op Pain Management:    Induction: Intravenous  Airway Management Planned: LMA  Additional Equipment:   Intra-op Plan:   Post-operative Plan:   Informed Consent: I have reviewed the patients History and Physical, chart, labs and discussed the procedure including the risks, benefits and alternatives for the proposed anesthesia with the patient or authorized representative who has indicated his/her understanding and acceptance.   Dental advisory given  Plan Discussed with: CRNA  Anesthesia Plan Comments:        Anesthesia Quick Evaluation

## 2015-03-24 NOTE — Transfer of Care (Signed)
Immediate Anesthesia Transfer of Care Note  Patient: [name]  Procedure(s) Performed: Procedure(s): CYSTOSCOPY WITH RETROGRADE PYELOGRAM (Bilateral) CYSTOSCOPY WITH URETEROSCOPY (Right) CYSTOSCOPY WITH STENT PLACEMENT (Right) HOLMIUM LASER APPLICATION (Right) STONE EXTRACTION WITH BASKET (Right)  Patient Location: PACU  Anesthesia Type:General  Level of Consciousness: awake, alert , oriented and patient cooperative  Airway & Oxygen Therapy: Patient Spontanous Breathing and Patient connected to nasal cannula oxygen  Post-op Assessment: Report given to RN and Post -op Vital signs reviewed and stable  Post vital signs: Reviewed and stable  Last Vitals:  Filed Vitals:   03/24/15 1230  BP: 152/73  Pulse: 56  Temp:   Resp: 18    Complications: No apparent anesthesia complications

## 2015-03-24 NOTE — H&P (Signed)
[name] is a 80 year old male seen today in follow-up from the emergency room for right ureteral stone.   History of Present Illness History of bilateral renal calculi: He underwent lithotripsy of a left sided UPJ stone in 8/09. A CT scan at that time had revealed stones in both kidneys (Right: 6 stones - 5 mm and less, Left: 5 stones - 4 mm and less). On 02/23/10 he underwent lithotripsy of a right renal pelvic stone. A stone in the lower pole of his left kidney was in the shock path and fragmented as well with complete clearance of the renal pelvic stone on followup KUB. He then developed a right ureteral stone which required lithotripsy on 09/28/10. He passed 2 stones spontaneously in 7/14 and another stone in 9/14.   A CT scan done on 09/17/13 revealed 4 stones in the right kidney all peripherally located without any hydronephrosis. On the left-hand side he does have a stone in the upper pole but is punctate but he also appears to have 2 stones in the renal pelvis with Hounsfield units of ~800 the largest measuring about 6 mm. He also appears to possibly have some dilatation of the renal pelvis on the left-hand side suggesting mild UPJ obstruction. He seems to pass all his stones from the left-hand side.  Stone analysis: Calcium oxalate 2.    Partial left UPJ obstruction: Renogram 11/14 - 43% function on the left hand side is 57% on the right with partial obstruction on the left. We discussed treatment with pyeloplasty however the patient did not want to undergo any form of surgery as he was asymptomatic and has maintained normal renal parenchyma without atrophy.         He gets his DRE and PSA done by his primary care physician yearly.     Interval history: He was seen in the ER yesterday and a CT scan revealed a 7 mm stone in the right mid ureter with what appeared to be a second stone immediately proximal with Hounsfield units of 800. In addition bilateral, nonobstructing renal  calculi were noted as well as his relative left UPJ obstruction without associated calyceal dilatation or loss of parenchyma. There were 2 stones seen within his dilated left renal pelvis.  He tolerated that he was having discomfort in his left lower quadrant today. He is not having any right-sided flank pain. He hasn't seen any stones pass.  The discomfort is not changed by position and there are no aggravating or alleviating factors that he is been able to identify. His discomfort is mild to moderate in severity.       Past Medical History Problems  1. History of Asthma (J45.909) 2. History of Calculus of ureter (N20.1) 3. History of hypertension (Z86.79)  Surgical History Problems  1. History of Cataract Surgery 2. History of Cystoscopy With Insertion Of Ureteral Stent Left 3. History of Cystoscopy With Ureteroscopy Left 4. History of Inguinal Hernia Repair 5. History of Lithotripsy 6. History of Lithotripsy 7. History of Lithotripsy 8. History of Lithotripsy 9. History of Transurethral Removal Of Internally Dwelling Ureteral Stent  Current Meds 1. Accupril TABS;  Therapy: (Recorded:29Jul2009) to Recorded 2. Albuterol AERS;  Therapy: (Recorded:29Jul2009) to Recorded 3. Aspirin 81 MG TABS;  Therapy: (Recorded:29Jul2009) to Recorded 4. Cipro 500 MG Oral Tablet;  Therapy: (Recorded:29Apr2016) to Recorded 5. Fish Oil CAPS;  Therapy: (Recorded:29Jul2009) to Recorded 6. Flovent HFA 44 MCG/ACT Inhalation Aerosol;  Therapy: 12Jul2011 to Recorded 7. Hydrochlorothiazide CAPS;  Therapy: (Recorded:29Jul2009) to  Recorded 8. Ibuprofen CAPS;  Therapy: (Recorded:29Jul2009) to Recorded 9. Multi-Vitamin TABS;  Therapy: (Recorded:29Jul2009) to Recorded 10. Oxycodone-Acetaminophen 5-325 MG Oral Tablet;   Therapy: (Recorded:29Apr2016) to Recorded 11. Singulair TABS;   Therapy: (Recorded:29Jul2009) to Recorded 12. Tamsulosin HCl - 0.4 MG Oral Capsule;   Therapy: (Recorded:29Apr2016)  to Recorded  Allergies Medication  1. No Known Drug Allergies  Family History Problems  1. Family history of Family Health Status Number Of Children   2 children 2. Family history of congestive heart failure (Z82.49) : Mother  Social History Problems  1. Caffeine Use 2. History of Former Smoker 3. Marital History - Currently Married 4. Never smoker 5. Denied: History of Tobacco Use  Review of Systems Gastrointestinal: flank pain and abdominal pain.  ENT: sinus problems.  Respiratory: cough.  Musculoskeletal: back pain and joint pain.  Genitourinary, constitutional, skin, eye, otolaryngeal, hematologic/lymphatic, cardiovascular, pulmonary, endocrine, musculoskeletal, gastrointestinal, neurological and psychiatric system(s) were reviewed and pertinent findings if present are noted.      Vitals Vital Signs   Blood Pressure: 133 / 68 Temperature: 98.1 F Heart Rate: 759  Physical Exam Constitutional: Well nourished and well developed. No acute distress.  ENT: The ears and nose are normal in appearance.  Neck: The appearance of the neck is normal and no neck mass is present.  Pulmonary: No respiratory distress and normal respiratory rhythm and effort.  Cardiovascular: Heart rate and rhythm are normal. No peripheral edema.  Abdomen: The abdomen is soft and nontender. No masses are palpated. No CVA tenderness. No hernias are palpable. No hepatosplenomegaly noted.  Lymphatics: The femoral and inguinal nodes are not enlarged or tender.  Skin: Normal skin turgor, no visible rash and no visible skin lesions.  Neuro/Psych: Mood and affect are appropriate.  The following images/tracing/specimen were independently visualized:  CT scan as above.  The following clinical lab reports were reviewed:  UA: there were white blood cells but no red blood cells were noted. There is no sign of infection.  The following radiology reports were reviewed: CT scan.   On KUB today he has 2 stones in  the left kidney which are easily visible but the right ureteral stones are not visible along the expected course of the ureter. Over the sacrum and with Hounsfield units of only 800 are relatively faint and may be difficult to see.    Assessment       We discussed the management of urinary stones. These options include observation, ureteroscopy, shockwave lithotripsy, and PCNL. We discussed which options are relevant to these particular stones. We discussed the natural history of stones as well as the complications of untreated stones and the impact on quality of life without treatment as well as with each of the above listed treatments. We also discussed the efficacy of each treatment in its ability to clear the stone burden. With any of these management options I discussed the signs and symptoms of infection and the need for emergent treatment should these be experienced. For each option we discussed the ability of each procedure to clear the patient of their stone burden.    For observation I described the risks which include but are not limited to silent renal damage, life-threatening infection, need for emergent surgery, failure to pass stone, and pain.    For ureteroscopy I described the risks which include heart attack, stroke, pulmonary embolus, death, bleeding, infection, damage to contiguous structures, positioning injury, ureteral stricture, ureteral avulsion, ureteral injury, need for ureteral stent, inability to perform  ureteroscopy, need for an interval procedure, inability to clear stone burden, stent discomfort and pain.    For shockwave lithotripsy I described the risks which include arrhythmia, kidney contusion, kidney hemorrhage, need for transfusion, long-term risk of diabetes or hypertension, back discomfort, flank ecchymosis, flank abrasion, inability to break up stone, inability to pass stone fragments, Steinstrasse, infection associated with obstructing stones, need for  different surgical procedure and the need for repeat shockwave lithotripsy. I cannot definitely see his stones on the right-hand side and therefore lithotripsy is not a good option for him.    We have therefore discussed proceeding with further evaluation of both sides. On the right-hand side I told him I would perform a retrograde pyelogram and treat his ureteral stones with laser lithotripsy and on the left-hand side I would perform a retrograde pyelogram and told him I would consider possibly evaluating further with ureteroscopy depending on my findings at the time of his retrograde.     Plan    Cystoscopy, bilateral retrograde pyelograms, right ureteroscopy and laser lithotripsy as an outpatient.

## 2015-03-24 NOTE — Discharge Instructions (Signed)

## 2015-03-24 NOTE — Anesthesia Procedure Notes (Addendum)
Procedure Name: Intubation Date/Time: 03/24/2015 10:25 AM Performed by: Tyrone NineSAUVE, Travin Marik F Pre-anesthesia Checklist: Patient identified, Timeout performed, Emergency Drugs available, Suction available and Patient being monitored Patient Re-evaluated:Patient Re-evaluated prior to inductionOxygen Delivery Method: Circle system utilized Preoxygenation: Pre-oxygenation with 100% oxygen Intubation Type: IV induction, Rapid sequence and Cricoid Pressure applied Laryngoscope Size: Mac and 3 Grade View: Grade I Tube type: Oral Tube size: 8.0 mm Number of attempts: 1 Airway Equipment and Method: Stylet Placement Confirmation: positive ETCO2,  breath sounds checked- equal and bilateral and ETT inserted through vocal cords under direct vision Secured at: 22 cm Tube secured with: Tape Dental Injury: Teeth and Oropharynx as per pre-operative assessment  Comments: Pt nauseated and vomited clear green liquid after being positioned on OR table.

## 2015-03-25 ENCOUNTER — Encounter (HOSPITAL_BASED_OUTPATIENT_CLINIC_OR_DEPARTMENT_OTHER): Payer: Self-pay | Admitting: Urology

## 2015-04-11 ENCOUNTER — Ambulatory Visit (INDEPENDENT_AMBULATORY_CARE_PROVIDER_SITE_OTHER): Payer: Medicare Other | Admitting: Family Medicine

## 2015-04-11 ENCOUNTER — Encounter: Payer: Self-pay | Admitting: Family Medicine

## 2015-04-11 VITALS — BP 145/82 | HR 66 | Temp 98.3°F | Ht 64.5 in | Wt 153.0 lb

## 2015-04-11 DIAGNOSIS — M7632 Iliotibial band syndrome, left leg: Secondary | ICD-10-CM

## 2015-04-11 NOTE — Progress Notes (Signed)
Pre visit review using our clinic review tool, if applicable. No additional management support is needed unless otherwise documented below in the visit note. 

## 2015-04-11 NOTE — Progress Notes (Signed)
   Subjective:    Patient ID: [name], male    DOB: 06/18/1935, 80 y.o.   MRN: [id number]  HPI Here for 3 days of pain along the lateral left thigh. No swelling or discoloration. He thinks this started as a result of him slapping his thigh a few days to get the attention of his cat, but I think a more pertinent hx is he has been climbing up and down ladders for several weeks painting his house.    Review of Systems  Constitutional: Negative.   Musculoskeletal: Positive for myalgias. Negative for joint swelling and gait problem.       Objective:   Physical Exam  Constitutional: He appears well-developed and well-nourished. No distress.  Musculoskeletal: He exhibits no edema.  He is tender along the left lateral leg from the middle thigh to the middle lower leg. No swelling or masses. Full ROM          Assessment & Plan:  IT band syndrome. Rest, avoid climbing ladders for the next few weeks. Use ice and take 800 mg of Ibuprofen tid prn.

## 2015-04-15 ENCOUNTER — Other Ambulatory Visit (HOSPITAL_COMMUNITY): Payer: Self-pay | Admitting: Urology

## 2015-04-15 ENCOUNTER — Other Ambulatory Visit: Payer: Self-pay | Admitting: Urology

## 2015-04-15 DIAGNOSIS — N200 Calculus of kidney: Secondary | ICD-10-CM

## 2015-04-16 ENCOUNTER — Telehealth (HOSPITAL_COMMUNITY): Payer: Self-pay | Admitting: Radiology

## 2015-04-16 NOTE — Telephone Encounter (Signed)
Pt called.  Appointment showing in My Chart for 05/20/15 for pre-procedure in SS when procedure is scheduled for 05/30/15.  Short Stay has been notified and pre-procedure appointment has been moved to 05/30/15.

## 2015-05-20 ENCOUNTER — Ambulatory Visit (HOSPITAL_COMMUNITY): Payer: Medicare Other

## 2015-05-26 ENCOUNTER — Other Ambulatory Visit (HOSPITAL_COMMUNITY): Payer: Self-pay | Admitting: *Deleted

## 2015-05-26 NOTE — Patient Instructions (Addendum)
YOUR PROCEDURE IS SCHEDULED ON :  05/30/15  REPORT TO [facility] MAIN ENTRANCE FOLLOW SIGNS TO EAST ELEVATOR - GO TO 3rd FLOOR CHECK IN AT 3 EAST NURSES STATION (SHORT STAY) AT:  6:30 AM  CALL THIS NUMBER IF YOU HAVE PROBLEMS THE MORNING OF SURGERY [phone]  REMEMBER:ONLY 1 PER PERSON MAY GO TO SHORT STAY WITH YOU TO GET READY THE MORNING OF YOUR SURGERY  DO NOT EAT FOOD OR DRINK LIQUIDS AFTER MIDNIGHT  TAKE THESE MEDICINES THE MORNING OF SURGERY:  SINGULAIR  YOU MAY NOT HAVE ANY METAL ON YOUR BODY INCLUDING HAIR PINS AND PIERCING'S. DO NOT WEAR JEWELRY, MAKEUP, LOTIONS, POWDERS OR PERFUMES. DO NOT WEAR NAIL POLISH. DO NOT SHAVE 48 HRS PRIOR TO SURGERY. MEN MAY SHAVE FACE AND NECK.  DO NOT BRING VALUABLES TO HOSPITAL. [facility] IS NOT RESPONSIBLE FOR VALUABLES.  CONTACTS, DENTURES OR PARTIALS MAY NOT BE WORN TO SURGERY. LEAVE SUITCASE IN CAR. CAN BE BROUGHT TO ROOM AFTER SURGERY.  PATIENTS DISCHARGED THE DAY OF SURGERY WILL NOT BE ALLOWED TO DRIVE HOME.  PLEASE READ OVER THE FOLLOWING INSTRUCTION SHEETS _________________________________________________________________________________                                          [place] - PREPARING FOR SURGERY  Before surgery, you can play an important role.  Because skin is not sterile, your skin needs to be as free of germs as possible.  You can reduce the number of germs on your skin by washing with CHG (chlorahexidine gluconate) soap before surgery.  CHG is an antiseptic cleaner which kills germs and bonds with the skin to continue killing germs even after washing. Please DO NOT use if you have an allergy to CHG or antibacterial soaps.  If your skin becomes reddened/irritated stop using the CHG and inform your nurse when you arrive at Short Stay. Do not shave (including legs and underarms) for at least 48 hours prior to the first CHG shower.  You may shave your face. Please follow these instructions  carefully:   1.  Shower with CHG Soap the night before surgery and the  morning of Surgery.   2.  If you choose to wash your hair, wash your hair first as usual with your  normal  Shampoo.   3.  After you shampoo, rinse your hair and body thoroughly to remove the  shampoo.                                         4.  Use CHG as you would any other liquid soap.  You can apply chg directly  to the skin and wash . Gently wash with scrungie or clean wascloth    5.  Apply the CHG Soap to your body ONLY FROM THE NECK DOWN.   Do not use on open                           Wound or open sores. Avoid contact with eyes, ears mouth and genitals (private parts).                        Genitals (private parts) with your normal soap.  6.  Wash thoroughly, paying special attention to the area where your surgery  will be performed.   7.  Thoroughly rinse your body with warm water from the neck down.   8.  DO NOT shower/wash with your normal soap after using and rinsing off  the CHG Soap .                9.  Pat yourself dry with a clean towel.             10.  Wear clean night clothes to bed after shower             11.  Place clean sheets on your bed the night of your first shower and do not  sleep with pets.  Day of Surgery : Do not apply any lotions/deodorants the morning of surgery.  Please wear clean clothes to the hospital/surgery center.  FAILURE TO FOLLOW THESE INSTRUCTIONS MAY RESULT IN THE CANCELLATION OF YOUR SURGERY    PATIENT SIGNATURE_________________________________  ______________________________________________________________________

## 2015-05-27 ENCOUNTER — Encounter (HOSPITAL_COMMUNITY): Payer: Self-pay

## 2015-05-27 ENCOUNTER — Encounter (HOSPITAL_COMMUNITY)
Admission: RE | Admit: 2015-05-27 | Discharge: 2015-05-27 | Disposition: A | Discharge status: Home / Self Care | Payer: Medicare Other | Source: Ambulatory Visit | Attending: Urology | Admitting: Urology

## 2015-05-27 DIAGNOSIS — I10 Essential (primary) hypertension: Secondary | ICD-10-CM | POA: Diagnosis not present

## 2015-05-27 DIAGNOSIS — Z7982 Long term (current) use of aspirin: Secondary | ICD-10-CM | POA: Diagnosis not present

## 2015-05-27 DIAGNOSIS — Z87442 Personal history of urinary calculi: Secondary | ICD-10-CM | POA: Diagnosis not present

## 2015-05-27 DIAGNOSIS — N521 Erectile dysfunction due to diseases classified elsewhere: Secondary | ICD-10-CM | POA: Diagnosis not present

## 2015-05-27 DIAGNOSIS — N400 Enlarged prostate without lower urinary tract symptoms: Secondary | ICD-10-CM | POA: Diagnosis not present

## 2015-05-27 DIAGNOSIS — N200 Calculus of kidney: Secondary | ICD-10-CM | POA: Diagnosis not present

## 2015-05-27 DIAGNOSIS — Z79899 Other long term (current) drug therapy: Secondary | ICD-10-CM | POA: Diagnosis not present

## 2015-05-27 DIAGNOSIS — M1990 Unspecified osteoarthritis, unspecified site: Secondary | ICD-10-CM | POA: Diagnosis not present

## 2015-05-27 DIAGNOSIS — Z87891 Personal history of nicotine dependence: Secondary | ICD-10-CM | POA: Diagnosis not present

## 2015-05-27 DIAGNOSIS — J45909 Unspecified asthma, uncomplicated: Secondary | ICD-10-CM | POA: Diagnosis not present

## 2015-05-27 DIAGNOSIS — K219 Gastro-esophageal reflux disease without esophagitis: Secondary | ICD-10-CM | POA: Diagnosis not present

## 2015-05-27 HISTORY — DX: Personal history of other diseases of the nervous system and sense organs: Z86.69

## 2015-05-27 LAB — BASIC METABOLIC PANEL
Anion gap: 7 (ref 5–15)
BUN: 11 mg/dL (ref 6–20)
CO2: 30 mmol/L (ref 22–32)
CREATININE: 1.06 mg/dL (ref 0.61–1.24)
Calcium: 10.5 mg/dL — ABNORMAL HIGH (ref 8.9–10.3)
Chloride: 102 mmol/L (ref 101–111)
GFR calc Af Amer: >60 mL/min (ref >60)
GLUCOSE: 102 mg/dL — AB (ref 65–99)
POTASSIUM: 4.3 mmol/L (ref 3.5–5.1)
Sodium: 139 mmol/L (ref 135–145)

## 2015-05-27 LAB — CBC
HCT: 42.4 % (ref 39.0–52.0)
HEMOGLOBIN: 14 g/dL (ref 13.0–17.0)
MCH: 30 pg (ref 26.0–34.0)
MCHC: 33 g/dL (ref 30.0–36.0)
MCV: 91 fL (ref 78.0–100.0)
Platelets: 218 10*3/uL (ref 150–400)
RBC: 4.66 MIL/uL (ref 4.22–5.81)
RDW: 13.6 % (ref 11.5–15.5)
WBC: 5.9 10*3/uL (ref 4.0–10.5)

## 2015-05-27 NOTE — Progress Notes (Signed)
   05/27/15 0843  OBSTRUCTIVE SLEEP APNEA  Have you ever been diagnosed with sleep apnea through a sleep study? No  Do you snore loudly (loud enough to be heard through closed doors)?  1  Do you often feel tired, fatigued, or sleepy during the daytime? 0  Has anyone observed you stop breathing during your sleep? 1  Do you have, or are you being treated for high blood pressure? 1  BMI more than 35 kg/m2? 0  Age over 78 years old? 1  Neck circumference greater than 40 cm/16 inches? 0  Gender: 1  Obstructive Sleep Apnea Score 5

## 2015-05-29 ENCOUNTER — Other Ambulatory Visit: Payer: Self-pay | Admitting: Physician Assistant

## 2015-05-30 ENCOUNTER — Ambulatory Visit (HOSPITAL_COMMUNITY): Payer: Medicare Other | Admitting: Anesthesiology

## 2015-05-30 ENCOUNTER — Encounter (HOSPITAL_COMMUNITY): Payer: Self-pay | Admitting: *Deleted

## 2015-05-30 ENCOUNTER — Ambulatory Visit (HOSPITAL_COMMUNITY)
Admission: RE | Admit: 2015-05-30 | Discharge: 2015-05-30 | Disposition: A | Discharge status: Home / Self Care | Payer: Medicare Other | Source: Ambulatory Visit | Attending: Urology | Admitting: Urology

## 2015-05-30 ENCOUNTER — Encounter (HOSPITAL_COMMUNITY)
Admission: RE | Disposition: A | Discharge status: Home / Self Care | Payer: Self-pay | Source: Ambulatory Visit | Attending: Urology

## 2015-05-30 ENCOUNTER — Ambulatory Visit (HOSPITAL_COMMUNITY)
Admission: RE | Admit: 2015-05-30 | Discharge: 2015-05-31 | Disposition: A | Discharge status: Home / Self Care | Payer: Medicare Other | Source: Ambulatory Visit | Attending: Urology | Admitting: Urology

## 2015-05-30 ENCOUNTER — Ambulatory Visit (HOSPITAL_COMMUNITY): Payer: Medicare Other

## 2015-05-30 DIAGNOSIS — K219 Gastro-esophageal reflux disease without esophagitis: Secondary | ICD-10-CM | POA: Diagnosis not present

## 2015-05-30 DIAGNOSIS — J45909 Unspecified asthma, uncomplicated: Secondary | ICD-10-CM | POA: Diagnosis not present

## 2015-05-30 DIAGNOSIS — N400 Enlarged prostate without lower urinary tract symptoms: Secondary | ICD-10-CM | POA: Insufficient documentation

## 2015-05-30 DIAGNOSIS — Z7982 Long term (current) use of aspirin: Secondary | ICD-10-CM | POA: Insufficient documentation

## 2015-05-30 DIAGNOSIS — Z87891 Personal history of nicotine dependence: Secondary | ICD-10-CM | POA: Insufficient documentation

## 2015-05-30 DIAGNOSIS — N200 Calculus of kidney: Secondary | ICD-10-CM

## 2015-05-30 DIAGNOSIS — Z79899 Other long term (current) drug therapy: Secondary | ICD-10-CM | POA: Insufficient documentation

## 2015-05-30 DIAGNOSIS — Z87442 Personal history of urinary calculi: Secondary | ICD-10-CM | POA: Insufficient documentation

## 2015-05-30 DIAGNOSIS — M1990 Unspecified osteoarthritis, unspecified site: Secondary | ICD-10-CM | POA: Insufficient documentation

## 2015-05-30 DIAGNOSIS — I10 Essential (primary) hypertension: Secondary | ICD-10-CM | POA: Diagnosis not present

## 2015-05-30 DIAGNOSIS — N521 Erectile dysfunction due to diseases classified elsewhere: Secondary | ICD-10-CM | POA: Insufficient documentation

## 2015-05-30 HISTORY — PX: NEPHROLITHOTOMY: SHX5134

## 2015-05-30 LAB — CBC
HEMATOCRIT: 40.4 % (ref 39.0–52.0)
HEMOGLOBIN: 13.5 g/dL (ref 13.0–17.0)
MCH: 30.1 pg (ref 26.0–34.0)
MCHC: 33.4 g/dL (ref 30.0–36.0)
MCV: 90 fL (ref 78.0–100.0)
PLATELETS: 213 10*3/uL (ref 150–400)
RBC: 4.49 MIL/uL (ref 4.22–5.81)
RDW: 13.2 % (ref 11.5–15.5)
WBC: 5.8 10*3/uL (ref 4.0–10.5)

## 2015-05-30 LAB — PROTIME-INR
INR: 1 (ref 0.00–1.49)
Prothrombin Time: 13.4 seconds (ref 11.6–15.2)

## 2015-05-30 LAB — APTT: aPTT: 28 seconds (ref 24–37)

## 2015-05-30 SURGERY — NEPHROLITHOTOMY PERCUTANEOUS
Anesthesia: General | Laterality: Left

## 2015-05-30 MED ORDER — FENTANYL CITRATE (PF) 100 MCG/2ML IJ SOLN
INTRAMUSCULAR | Status: AC | PRN
  Administered 2015-05-30 (×2): 50 ug via INTRAVENOUS

## 2015-05-30 MED ORDER — IOHEXOL 300 MG/ML  SOLN
INTRAMUSCULAR | Status: DC | PRN
  Administered 2015-05-30: 10 mL via INTRAVENOUS

## 2015-05-30 MED ORDER — ONDANSETRON HCL 4 MG/2ML IJ SOLN
INTRAMUSCULAR | Status: AC
  Filled 2015-05-30: qty 2

## 2015-05-30 MED ORDER — FENTANYL CITRATE (PF) 100 MCG/2ML IJ SOLN
25.0000 ug | INTRAMUSCULAR | Status: DC | PRN
  Administered 2015-05-30: 25 ug via INTRAVENOUS

## 2015-05-30 MED ORDER — OXYCODONE-ACETAMINOPHEN 5-325 MG PO TABS: 1.0000 | ORAL_TABLET | ORAL | Status: DC | PRN

## 2015-05-30 MED ORDER — OXYBUTYNIN CHLORIDE 5 MG PO TABS
5.0000 mg | ORAL_TABLET | Freq: Three times a day (TID) | ORAL | Status: DC | PRN
Start: 2015-05-30 — End: 2015-05-31

## 2015-05-30 MED ORDER — DEXAMETHASONE SODIUM PHOSPHATE 10 MG/ML IJ SOLN
INTRAMUSCULAR | Status: AC
  Filled 2015-05-30: qty 1

## 2015-05-30 MED ORDER — GLYCOPYRROLATE 0.2 MG/ML IJ SOLN
INTRAMUSCULAR | Status: AC
Start: 2015-05-30 — End: 2015-05-31
  Filled 2015-05-30: qty 1

## 2015-05-30 MED ORDER — LIDOCAINE HCL 1 % IJ SOLN
INTRAMUSCULAR | Status: AC
  Filled 2015-05-30: qty 20

## 2015-05-30 MED ORDER — ATROPINE SULFATE 1 MG/ML IJ SOLN
INTRAMUSCULAR | Status: AC
  Filled 2015-05-30: qty 1

## 2015-05-30 MED ORDER — PHENAZOPYRIDINE HCL 200 MG PO TABS: 200.0000 mg | ORAL_TABLET | Freq: Three times a day (TID) | ORAL | Status: DC | PRN

## 2015-05-30 MED ORDER — MIDAZOLAM HCL 2 MG/2ML IJ SOLN
INTRAMUSCULAR | Status: AC | PRN
  Administered 2015-05-30 (×3): 1 mg via INTRAVENOUS

## 2015-05-30 MED ORDER — PROPOFOL 10 MG/ML IV BOLUS
INTRAVENOUS | Status: AC
  Filled 2015-05-30: qty 20

## 2015-05-30 MED ORDER — IOHEXOL 300 MG/ML  SOLN
20.0000 mL | Freq: Once | INTRAMUSCULAR | Status: AC | PRN
Start: 2015-05-30 — End: 2015-05-30
  Administered 2015-05-30: 15 mL

## 2015-05-30 MED ORDER — FENTANYL CITRATE (PF) 250 MCG/5ML IJ SOLN
INTRAMUSCULAR | Status: DC | PRN
  Administered 2015-05-30: 50 ug via INTRAVENOUS
  Administered 2015-05-30 (×2): 25 ug via INTRAVENOUS

## 2015-05-30 MED ORDER — ONDANSETRON HCL 4 MG/2ML IJ SOLN
INTRAMUSCULAR | Status: DC | PRN
  Administered 2015-05-30: 4 mg via INTRAVENOUS

## 2015-05-30 MED ORDER — GLYCOPYRROLATE 0.2 MG/ML IJ SOLN
INTRAMUSCULAR | Status: AC
  Filled 2015-05-30: qty 3

## 2015-05-30 MED ORDER — ROCURONIUM BROMIDE 100 MG/10ML IV SOLN
INTRAVENOUS | Status: DC | PRN
  Administered 2015-05-30: 30 mg via INTRAVENOUS

## 2015-05-30 MED ORDER — SODIUM CHLORIDE 0.9 % IR SOLN
Status: DC | PRN
  Administered 2015-05-30: 9000 mL

## 2015-05-30 MED ORDER — CEFAZOLIN SODIUM-DEXTROSE 2-3 GM-% IV SOLR
INTRAVENOUS | Status: AC
  Filled 2015-05-30: qty 50

## 2015-05-30 MED ORDER — MIDAZOLAM HCL 2 MG/2ML IJ SOLN
INTRAMUSCULAR | Status: AC
  Filled 2015-05-30: qty 6

## 2015-05-30 MED ORDER — CIPROFLOXACIN IN D5W 200 MG/100ML IV SOLN
200.0000 mg | Freq: Two times a day (BID) | INTRAVENOUS | Status: DC
  Administered 2015-05-30: 200 mg via INTRAVENOUS
  Filled 2015-05-30 (×2): qty 100

## 2015-05-30 MED ORDER — MIDAZOLAM HCL 2 MG/2ML IJ SOLN: 0.5000 mg | Freq: Once | INTRAMUSCULAR | Status: DC | PRN

## 2015-05-30 MED ORDER — GI COCKTAIL ~~LOC~~
30.0000 mL | Freq: Once | ORAL | Status: AC
  Administered 2015-05-30: 30 mL via ORAL
  Filled 2015-05-30: qty 30

## 2015-05-30 MED ORDER — ONDANSETRON HCL 4 MG/2ML IJ SOLN
4.0000 mg | INTRAMUSCULAR | Status: DC | PRN
  Administered 2015-05-30: 4 mg via INTRAVENOUS
  Filled 2015-05-30: qty 2

## 2015-05-30 MED ORDER — DEXTROSE-NACL 5-0.45 % IV SOLN
INTRAVENOUS | Status: DC
  Administered 2015-05-30 – 2015-05-31 (×3): via INTRAVENOUS

## 2015-05-30 MED ORDER — LACTATED RINGERS IV SOLN
INTRAVENOUS | Status: DC | PRN
  Administered 2015-05-30: 10:00:00 via INTRAVENOUS

## 2015-05-30 MED ORDER — LACTATED RINGERS IV SOLN
INTRAVENOUS | Status: DC
  Administered 2015-05-30: 1000 mL via INTRAVENOUS
  Administered 2015-05-30: 13:00:00 via INTRAVENOUS

## 2015-05-30 MED ORDER — FENTANYL CITRATE (PF) 100 MCG/2ML IJ SOLN
INTRAMUSCULAR | Status: AC
  Filled 2015-05-30: qty 2

## 2015-05-30 MED ORDER — ATROPINE SULFATE 0.1 MG/ML IJ SOLN
INTRAMUSCULAR | Status: AC
  Filled 2015-05-30: qty 10

## 2015-05-30 MED ORDER — CIPROFLOXACIN IN D5W 200 MG/100ML IV SOLN
200.0000 mg | INTRAVENOUS | Status: AC
  Administered 2015-05-30: 200 mg via INTRAVENOUS
  Filled 2015-05-30 (×2): qty 100

## 2015-05-30 MED ORDER — OXYCODONE-ACETAMINOPHEN 10-325 MG PO TABS: 1.0000 | ORAL_TABLET | ORAL | Status: DC | PRN

## 2015-05-30 MED ORDER — HYDROMORPHONE HCL 1 MG/ML IJ SOLN
0.2500 mg | INTRAMUSCULAR | Status: DC | PRN
  Administered 2015-05-30: 0.5 mg via INTRAVENOUS
  Administered 2015-05-30 (×2): 0.25 mg via INTRAVENOUS
  Administered 2015-05-30: 0.5 mg via INTRAVENOUS

## 2015-05-30 MED ORDER — PROMETHAZINE HCL 25 MG/ML IJ SOLN: 6.2500 mg | INTRAMUSCULAR | Status: DC | PRN

## 2015-05-30 MED ORDER — HYDROMORPHONE HCL 1 MG/ML IJ SOLN
INTRAMUSCULAR | Status: AC
  Filled 2015-05-30: qty 1

## 2015-05-30 MED ORDER — NEOSTIGMINE METHYLSULFATE 10 MG/10ML IV SOLN
INTRAVENOUS | Status: DC | PRN
  Administered 2015-05-30: 4 mg via INTRAVENOUS

## 2015-05-30 MED ORDER — ACETAMINOPHEN 500 MG PO TABS
1000.0000 mg | ORAL_TABLET | Freq: Four times a day (QID) | ORAL | Status: DC
  Administered 2015-05-30 – 2015-05-31 (×3): 1000 mg via ORAL
  Filled 2015-05-30 (×3): qty 2

## 2015-05-30 MED ORDER — GLYCOPYRROLATE 0.2 MG/ML IJ SOLN
INTRAMUSCULAR | Status: DC | PRN
  Administered 2015-05-30: 0.6 mg via INTRAVENOUS

## 2015-05-30 MED ORDER — PROPOFOL 10 MG/ML IV BOLUS
INTRAVENOUS | Status: DC | PRN
  Administered 2015-05-30: 150 mg via INTRAVENOUS

## 2015-05-30 MED ORDER — NEOSTIGMINE METHYLSULFATE 10 MG/10ML IV SOLN
INTRAVENOUS | Status: AC
  Filled 2015-05-30: qty 1

## 2015-05-30 MED ORDER — FENTANYL CITRATE (PF) 100 MCG/2ML IJ SOLN
INTRAMUSCULAR | Status: AC
  Filled 2015-05-30: qty 4

## 2015-05-30 MED ORDER — HYDROMORPHONE HCL 1 MG/ML IJ SOLN
0.5000 mg | INTRAMUSCULAR | Status: DC | PRN
  Administered 2015-05-30: 1 mg via INTRAVENOUS
  Filled 2015-05-30: qty 1

## 2015-05-30 MED ORDER — CEFAZOLIN SODIUM-DEXTROSE 2-3 GM-% IV SOLR
2.0000 g | Freq: Once | INTRAVENOUS | Status: AC
  Administered 2015-05-30: 2 g via INTRAVENOUS

## 2015-05-30 MED ORDER — ROCURONIUM BROMIDE 100 MG/10ML IV SOLN
INTRAVENOUS | Status: AC
  Filled 2015-05-30: qty 1

## 2015-05-30 MED ORDER — MEPERIDINE HCL 50 MG/ML IJ SOLN: 6.2500 mg | INTRAMUSCULAR | Status: DC | PRN

## 2015-05-30 MED ORDER — PANTOPRAZOLE SODIUM 40 MG PO TBEC
40.0000 mg | DELAYED_RELEASE_TABLET | Freq: Every day | ORAL | Status: DC
  Administered 2015-05-30: 40 mg via ORAL
  Filled 2015-05-30: qty 1

## 2015-05-30 MED ORDER — DEXAMETHASONE SODIUM PHOSPHATE 10 MG/ML IJ SOLN
INTRAMUSCULAR | Status: DC | PRN
  Administered 2015-05-30: 10 mg via INTRAVENOUS

## 2015-05-30 MED ORDER — SODIUM CHLORIDE 0.9 % IV SOLN
INTRAVENOUS | Status: DC
  Administered 2015-05-30: 08:00:00 via INTRAVENOUS

## 2015-05-30 SURGICAL SUPPLY — 56 items
APL ESCP 34 STRL LF DISP (HEMOSTASIS) ×1
APL SKNCLS STERI-STRIP NONHPOA (GAUZE/BANDAGES/DRESSINGS) ×1
APPLICATOR SURGIFLO ENDO (HEMOSTASIS) ×1 IMPLANT
BAG URINE DRAINAGE (UROLOGICAL SUPPLIES) ×2 IMPLANT
BASKET ZERO TIP NITINOL 2.4FR (BASKET) IMPLANT
BENZOIN TINCTURE PRP APPL 2/3 (GAUZE/BANDAGES/DRESSINGS) ×2 IMPLANT
BLADE SURG 15 STRL LF DISP TIS (BLADE) ×1 IMPLANT
BLADE SURG 15 STRL SS (BLADE) ×2
BSKT STON RTRVL ZERO TP 2.4FR (BASKET)
CATCHER STONE W/TUBE ADAPTER (UROLOGICAL SUPPLIES) IMPLANT
CATH FOLEY 2W COUNCIL 20FR 5CC (CATHETERS) IMPLANT
CATH FOLEY 2WAY SLVR  5CC 18FR (CATHETERS) ×1
CATH FOLEY 2WAY SLVR 5CC 18FR (CATHETERS) ×1 IMPLANT
CATH IMAGER II 65CM (CATHETERS) ×1 IMPLANT
CATH X-FORCE N30 NEPHROSTOMY (TUBING) ×2 IMPLANT
COVER SURGICAL LIGHT HANDLE (MISCELLANEOUS) ×2 IMPLANT
DRAPE C-ARM 42X120 X-RAY (DRAPES) ×2 IMPLANT
DRAPE CAMERA CLOSED 9X96 (DRAPES) ×2 IMPLANT
DRAPE LINGEMAN PERC (DRAPES) ×2 IMPLANT
DRAPE SURG IRRIG POUCH 19X23 (DRAPES) ×2 IMPLANT
DRSG PAD ABDOMINAL 8X10 ST (GAUZE/BANDAGES/DRESSINGS) ×2 IMPLANT
DRSG TEGADERM 4X4.75 (GAUZE/BANDAGES/DRESSINGS) ×1 IMPLANT
DRSG TEGADERM 8X12 (GAUZE/BANDAGES/DRESSINGS) ×4 IMPLANT
FIBER LASER FLEXIVA 1000 (UROLOGICAL SUPPLIES) IMPLANT
FIBER LASER FLEXIVA 200 (UROLOGICAL SUPPLIES) IMPLANT
FIBER LASER FLEXIVA 365 (UROLOGICAL SUPPLIES) IMPLANT
FIBER LASER FLEXIVA 550 (UROLOGICAL SUPPLIES) IMPLANT
FIBER LASER TRAC TIP (UROLOGICAL SUPPLIES) IMPLANT
FLOSEAL 10ML (HEMOSTASIS) ×1 IMPLANT
GAUZE SPONGE 4X4 12PLY STRL (GAUZE/BANDAGES/DRESSINGS) ×2 IMPLANT
GAUZE SPONGE 4X4 16PLY XRAY LF (GAUZE/BANDAGES/DRESSINGS) ×2 IMPLANT
GLOVE BIOGEL M 8.0 STRL (GLOVE) ×2 IMPLANT
GOWN STRL REUS W/TWL XL LVL3 (GOWN DISPOSABLE) ×2 IMPLANT
GUIDEWIRE AMPLAZ .035X145 (WIRE) ×1 IMPLANT
GUIDEWIRE STR DUAL SENSOR (WIRE) IMPLANT
HOLDER FOLEY CATH W/STRAP (MISCELLANEOUS) ×2 IMPLANT
KIT BASIN OR (CUSTOM PROCEDURE TRAY) ×2 IMPLANT
MANIFOLD NEPTUNE II (INSTRUMENTS) ×2 IMPLANT
NS IRRIG 1000ML POUR BTL (IV SOLUTION) ×2 IMPLANT
PACK BASIC VI WITH GOWN DISP (CUSTOM PROCEDURE TRAY) ×1 IMPLANT
PACK CYSTO (CUSTOM PROCEDURE TRAY) ×1 IMPLANT
PROBE LITHOCLAST ULTRA 3.8X403 (UROLOGICAL SUPPLIES) IMPLANT
PROBE PNEUMATIC 1.0MMX570MM (UROLOGICAL SUPPLIES) IMPLANT
SET IRRIG Y TYPE TUR BLADDER L (SET/KITS/TRAYS/PACK) ×2 IMPLANT
SET WARMING FLUID IRRIGATION (MISCELLANEOUS) IMPLANT
SHEATH PEELAWAY SET 9 (SHEATH) ×2 IMPLANT
STENT CONTOUR 7FRX26X.038 (STENTS) ×1 IMPLANT
STONE CATCHER W/TUBE ADAPTER (UROLOGICAL SUPPLIES) IMPLANT
SUT MNCRL AB 4-0 PS2 18 (SUTURE) IMPLANT
SUT SILK 2 0 30  PSL (SUTURE)
SUT SILK 2 0 30 PSL (SUTURE) IMPLANT
SYR 20CC LL (SYRINGE) ×4 IMPLANT
SYRINGE 10CC LL (SYRINGE) ×2 IMPLANT
TOWEL OR NON WOVEN STRL DISP B (DISPOSABLE) ×2 IMPLANT
TRAY FOLEY W/METER SILVER 14FR (SET/KITS/TRAYS/PACK) ×1 IMPLANT
TUBING CONNECTING 10 (TUBING) ×4 IMPLANT

## 2015-05-30 NOTE — Procedures (Signed)
Successful LT nephroureteral access for PNL No comp Stable Ready for OR

## 2015-05-30 NOTE — H&P (Signed)
[name] is a 80 year old   History of Present Illness    History of bilateral renal calculi: He underwent lithotripsy of a left sided UPJ stone in 8/09. A CT scan at that time had revealed stones in both kidneys (Right: 6 stones - 5 mm and less, Left: 5 stones - 4 mm and less). On 02/23/10 he underwent lithotripsy of a right renal pelvic stone. A stone in the lower pole of his left kidney was in the shock path and fragmented as well with complete clearance of the renal pelvic stone on followup KUB. He then developed a right ureteral stone which required lithotripsy on 09/28/10. He passed 2 stones spontaneously in 7/14 and another stone in 9/14.   A CT scan done on 09/17/13 revealed 4 stones in the right kidney all peripherally located without any hydronephrosis. On the left-hand side he has a stone in the upper pole but is punctate but he also appears to have 2 stones in the renal pelvis with Hounsfield units of ~800 the largest measuring about 6 mm. He also appears to possibly have some dilatation of the renal pelvis on the left-hand side suggesting mild UPJ obstruction. He seems to pass all his stones from the left-hand side.  Ureteroscopy and laser lithotripsy of right ureteral stone on 03/24/15  Stone analysis: Calcium oxalate 3.    Partial left UPJ obstruction: Renogram 11/14 - 43% function on the left hand side is 57% on the right with partial obstruction on the left. We discussed treatment with pyeloplasty however the patient did not want to undergo any form of surgery as he was asymptomatic and has maintained normal renal parenchyma without atrophy.         He gets his DRE and PSA done by his primary care physician yearly.     Interval history: He reports he has had continued pain on the left hand side. He says he is really never had any difficulty on the right even when he had his right ureteral stone. The pain on his left hand side is intermittent. When it occurs sometimes last  as long as 45 minutes and then resolve spontaneously. It is severe and not relieved by positional change.        Past Medical History Problems  1. History of Asthma (J45.909) 2. History of Calculus of ureter (N20.1) 3. History of hypertension (Z86.79)  Surgical History Problems  1. History of Cataract Surgery 2. History of Cystoscopy With Insertion Of Ureteral Stent Left 3. History of Cystoscopy With Insertion Of Ureteral Stent Right 4. History of Cystoscopy With Ureteroscopy Left 5. History of Cystoscopy With Ureteroscopy With Lithotripsy 6. History of Inguinal Hernia Repair 7. History of Lithotripsy 8. History of Lithotripsy 9. History of Lithotripsy 10. History of Lithotripsy 11. History of Transurethral Removal Of Internally Dwelling Ureteral Stent  Current Meds 1. Accupril TABS;  Therapy: (Recorded:29Jul2009) to Recorded 2. Albuterol AERS;  Therapy: (Recorded:29Jul2009) to Recorded 3. Aspirin 81 MG TABS;  Therapy: (Recorded:29Jul2009) to Recorded 4. Cipro 500 MG Oral Tablet;  Therapy: (Recorded:29Apr2016) to Recorded 5. Fish Oil CAPS;  Therapy: (Recorded:29Jul2009) to Recorded 6. Flovent HFA 44 MCG/ACT Inhalation Aerosol;  Therapy: 12Jul2011 to Recorded 7. Hydrochlorothiazide CAPS;  Therapy: (Recorded:29Jul2009) to Recorded 8. Ibuprofen CAPS;  Therapy: (Recorded:29Jul2009) to Recorded 9. Multi-Vitamin TABS;  Therapy: (Recorded:29Jul2009) to Recorded 10. Oxycodone-Acetaminophen 5-325 MG Oral Tablet;   Therapy: (Recorded:29Apr2016) to Recorded 11. Singulair TABS;   Therapy: (Recorded:29Jul2009) to Recorded 12. Tamsulosin HCl - 0.4 MG Oral Capsule;  Therapy: (Recorded:29Apr2016) to Recorded  Allergies Medication  1. No Known Drug Allergies  Family History Problems  1. Family history of Family Health Status Number Of Children   2 children 2. Family history of congestive heart failure (Z82.49) : Mother  Social History Problems  1. Caffeine Use 2.  History of Former Smoker 3. Marital History - Currently Married 4. Never smoker 5. Denied: History of Tobacco Use  Review of Systems Genitourinary, constitutional, skin, eye, otolaryngeal, hematologic/lymphatic, cardiovascular, pulmonary, endocrine, musculoskeletal, gastrointestinal, neurological and psychiatric system(s) were reviewed and pertinent findings if present are noted.    Vitals Vital Signs  Height: 5 ft 5.5 in Weight: 149 lb  BMI Calculated: 24.42 BSA Calculated: 1.76 Blood Pressure: 149 / 73 Heart Rate: 71  Physical Exam Constitutional: Well nourished and well developed. No acute distress.  ENT: The ears and nose are normal in appearance.  Neck: The appearance of the neck is normal and no neck mass is present.  Pulmonary: No respiratory distress and normal respiratory rhythm and effort.  Cardiovascular: Heart rate and rhythm are normal. No peripheral edema.  Abdomen: The abdomen is soft and nontender. No masses are palpated. No CVA tenderness. No hernias are palpable. No hepatosplenomegaly noted.  Lymphatics: The femoral and inguinal nodes are not enlarged or tender.  Skin: Normal skin turgor, no visible rash and no visible skin lesions.  Neuro/Psych: Mood and affect are appropriate.     Assessment   We discussed the management of urinary stones. These options include observation, ureteroscopy, shockwave lithotripsy, and PCNL. We discussed which options are relevant to these particular stones. We discussed the natural history of stones as well as the complications of untreated stones and the impact on quality of life without treatment as well as with each of the above listed treatments. We also discussed the efficacy of each treatment in its ability to clear the stone burden. With any of these management options I discussed the signs and symptoms of infection and the need for emergent treatment should these be experienced. For each option we discussed the ability of each  procedure to clear the patient of their stone burden.    For observation I described the risks which include but are not limited to silent renal damage, life-threatening infection, need for emergent surgery, failure to pass stone, and pain.    For ureteroscopy I described the risks which include heart attack, stroke, pulmonary embolus, death, bleeding, infection, damage to contiguous structures, positioning injury, ureteral stricture, ureteral avulsion, ureteral injury, need for ureteral stent, inability to perform ureteroscopy, need for an interval procedure, inability to clear stone burden, stent discomfort and pain.    For shockwave lithotripsy I described the risks which include arrhythmia, kidney contusion, kidney hemorrhage, need for transfusion, long-term risk of diabetes or hypertension, back discomfort, flank ecchymosis, flank abrasion, inability to break up stone, inability to pass stone fragments, Steinstrasse, infection associated with obstructing stones, need for different surgical procedure and possible need for repeat shockwave lithotripsy.    For PCNL I described the risks including heart attack, sure, pulmonary embolus, death, positioning injury, pneumothorax, hydrothorax, need for chest tube, inability to clear stone burden, renal laceration, arterial venous fistula or malformation, need for embolization of kidney, loss of kidney or renal function, need for repeat procedure, need for prolonged nephrostomy tube, ureteral avulsion and fistula.    I told him that in his case ureteroscopy could be performed but it would be difficult just due to the stone burden alone but also  due to his anatomy. His anatomy would also make lithotripsy less likely to be successful and so I therefore have recommended a PCNL. He would like to proceed with this. He does understand that due to the peripheral location of some of his stones these may not be accessible but they are likely not let has been  causing him his intermittent pain. I think the pain is likely from the stones that are free-floating in his renal pelvis.   Plan  He is scheduled for a left percutaneous nephrolithotomy.

## 2015-05-30 NOTE — Anesthesia Postprocedure Evaluation (Signed)
  Anesthesia Post-op Note  Patient: [name]  Procedure(s) Performed: Procedure(s): LEFT PERCUTANEOUS NEPHROLITHOTOMY (Left)  Patient Location: PACU  Anesthesia Type:General  Level of Consciousness: awake, alert , oriented and patient cooperative  Airway and Oxygen Therapy: Patient Spontanous Breathing and Patient connected to nasal cannula oxygen  Post-op Pain: mild  Post-op Assessment: Post-op Vital signs reviewed, Patient's Cardiovascular Status Stable, Respiratory Function Stable, Patent Airway, No signs of Nausea or vomiting and Pain level controlled              Post-op Vital Signs: Reviewed and stable  Last Vitals:  Filed Vitals:   05/30/15 1434  BP: 157/57  Pulse: 61  Temp: 36.6 C  Resp:     Complications: No apparent anesthesia complications

## 2015-05-30 NOTE — Transfer of Care (Signed)
Immediate Anesthesia Transfer of Care Note  Patient: [name]  Procedure(s) Performed: Procedure(s): LEFT PERCUTANEOUS NEPHROLITHOTOMY (Left)  Patient Location: PACU  Anesthesia Type:General  Level of Consciousness: awake, alert  and patient cooperative  Airway & Oxygen Therapy: Patient Spontanous Breathing and Patient connected to face mask oxygen  Post-op Assessment: Report given to RN and Post -op Vital signs reviewed and stable  Post vital signs: Reviewed and stable  Last Vitals:  Filed Vitals:   05/30/15 1000  Pulse: 49  Resp: 16    Complications: No apparent anesthesia complications

## 2015-05-30 NOTE — Progress Notes (Addendum)
Pt became transiently significantly bradycardic, rate as low as 23, when passing the nephroureteral catheter past the stone.  Pt was asymptomatic, not requiring intervention with spontaneous resolution after approximately 1 minute. Otherwise, hemodynamically stable. 

## 2015-05-30 NOTE — Progress Notes (Signed)
Patient ID: [name], male   DOB: 06/05/1935, 80 y.o.   MRN: [id number] Night of surgery note  Subjective: The patient is doing well.  No complaints.  Objective: Vital signs in last 24 hours: Temp:  [96.9 F (36.1 Galvan)-98.2 F (36.8 Galvan)] 96.9 F (36.1 Galvan) (07/15 1215) Pulse Rate:  [23-99] 51 (07/15 1300) Resp:  [12-19] 17 (07/15 1300) BP: (113-162)/(41-82) 162/65 mmHg (07/15 1300) SpO2:  [96 %-100 %] 100 % (07/15 1300) Weight:  [70.761 kg (156 lb)] 70.761 kg (156 lb) (07/15 0600)  Intake/Output this shift: Total I/O In: 900 [I.V.:900] Out: 275 [Urine:175; Blood:100]  Physical Exam:  General: Alert and oriented. Abdomen: Soft, Nondistended. Flank: Clean with dry, intact dressing. His urine is slightly bloody but clearing.  No clots.  Lab Results:  Recent Labs  05/30/15 0705  HGB 13.5  HCT 40.4    Assessment/Plan: His baseline pulse rate is around 50 but it has dipped into the 30s in the PACU.  Despite this he maintained his blood pressure.  He is not on a beta blocker.  I will place him on telemetry but I think this is a completely benign phenomenon.  1) Continue to monitor 2) Per orders   [name]. Brian Ammonsttelin, MD  [name] 05/30/2015, 1:23 PM

## 2015-05-30 NOTE — H&P (Signed)
Chief Complaint: Kidney stones  Referring Physician(s): Ottelin,Mark  History of Present Illness: [name] is a 80 y.o. male with history of bilateral nephrolithiasis and partial left UPJ obstruction who presents today for percutaneous nephroureteral catheter placement prior to nephrolithotomy.  Past Medical History  Diagnosis Date  . Asthma   . GERD (gastroesophageal reflux disease)   . Hypertension   . Arthritis   . Nephrolithiasis     BILATERAL  . History of kidney stones   . BPH (benign prostatic hypertrophy)   . Mild carotid artery disease     bilateral ICA 0-39%  per duplex 02-16-2013  . Multiple pulmonary nodules     per last CT stable  . History of myasthenia gravis     15 yrs ago - no current problem or treatment    Past Surgical History  Procedure Laterality Date  . Cataract extraction w/ intraocular lens  implant, bilateral  2007  . Extracorporeal shock wave lithotripsy Bilateral left 02-23-2010 //   right 09-28-2010  . Cysto/  left retrograde pyelogram/  left ureteroscopy/  left stent placement  06-17-2008  . Colonoscopy w/ polypectomy  05-14-2010  . Inguinal hernia repair Bilateral 2002 approx.  . Cystoscopy w/ retrogrades Bilateral 03/24/2015    Procedure: CYSTOSCOPY WITH RETROGRADE PYELOGRAM;  Surgeon: Ihor GullyMark Ottelin, MD;  [facility] [place];  Service: Urology;  Laterality: Bilateral;  . Cystoscopy with ureteroscopy Right 03/24/2015    Procedure: CYSTOSCOPY WITH URETEROSCOPY;  Surgeon: Ihor GullyMark Ottelin, MD;  [facility] [place];  Service: Urology;  Laterality: Right;  . Cystoscopy with stent placement Right 03/24/2015    Procedure: CYSTOSCOPY WITH STENT PLACEMENT;  Surgeon: Ihor GullyMark Ottelin, MD;  [facility] [place];  Service: Urology;  Laterality: Right;  . Holmium laser application Right 03/24/2015    Procedure: HOLMIUM LASER APPLICATION;  Surgeon: Ihor GullyMark Ottelin, MD;  [facility] [place];   Service: Urology;  Laterality: Right;  . Stone extraction with basket Right 03/24/2015    Procedure: STONE EXTRACTION WITH BASKET;  Surgeon: Ihor GullyMark Ottelin, MD;  [facility] [place];  Service: Urology;  Laterality: Right;    Allergies: Review of patient's allergies indicates no known allergies.  Medications: Prior to Admission medications   Medication Sig Start Date End Date Taking? Authorizing Provider  aspirin 81 MG EC tablet Take 81 mg by mouth every morning.     Historical Provider, MD  calcium carbonate (TUMS - DOSED IN MG ELEMENTAL CALCIUM) 500 MG chewable tablet Chew 1 tablet by mouth as needed for indigestion or heartburn.    Historical Provider, MD  ciprofloxacin (CIPRO) 500 MG tablet Take 1 tablet (500 mg total) by mouth 2 (two) times daily. Patient not taking: Reported on 04/11/2015 03/13/15   Charlestine Nighthristopher Lawyer, PA-C  fluticasone (FLOVENT HFA) 44 MCG/ACT inhaler Inhale 1 puff into the lungs 2 (two) times daily. 08/29/14   Roderick PeeJeffrey A Todd, MD  ibuprofen (ADVIL,MOTRIN) 200 MG tablet Take 200 mg by mouth every 6 (six) hours as needed for fever or moderate pain.     Historical Provider, MD  lisinopril-hydrochlorothiazide (PRINZIDE,ZESTORETIC) 10-12.5 MG per tablet Take 1 tablet by mouth daily. Patient taking differently: Take 1 tablet by mouth every morning.  12/27/14   Roderick PeeJeffrey A Todd, MD  montelukast (SINGULAIR) 10 MG tablet Take 1 tablet (10 mg total) by mouth at bedtime. 08/29/14 08/29/15  Roderick PeeJeffrey A Todd, MD  Multiple Vitamin (MULTIVITAMIN) tablet Take 1 tablet by mouth every morning.  Historical Provider, MD  Omega-3 Fatty Acids (FISH OIL) 1200 MG CAPS Take 1 capsule by mouth every morning.     Historical Provider, MD  Oxycodone HCl 10 MG TABS Take 1 tablet (10 mg total) by mouth every 4 (four) hours as needed. Patient not taking: Reported on 04/11/2015 03/24/15   Ihor Gully, MD  oxyCODONE-acetaminophen (PERCOCET/ROXICET) 5-325 MG per tablet Take 1 tablet by mouth every  6 (six) hours as needed for severe pain. Patient not taking: Reported on 04/11/2015 03/13/15   Charlestine Night, PA-C  phenazopyridine (PYRIDIUM) 200 MG tablet Take 1 tablet (200 mg total) by mouth 3 (three) times daily as needed for pain. Patient not taking: Reported on 04/11/2015 03/24/15   Ihor Gully, MD  polyvinyl alcohol (LIQUIFILM TEARS) 1.4 % ophthalmic solution Place 1 drop into both eyes daily as needed for dry eyes.    Historical Provider, MD     Family History  Problem Relation Age of Onset  . Heart disease Mother   . Hypertension Mother   . Kidney disease Mother   . Heart disease Father   . Hypertension Father     History   Social History  . Marital Status: Married    Spouse Name: N/A  . Number of Children: N/A  . Years of Education: N/A   Social History Main Topics  . Smoking status: Never Smoker   . Smokeless tobacco: Never Used  . Alcohol Use: No  . Drug Use: No  . Sexual Activity: Not on file   Other Topics Concern  . Not on file   Social History Narrative     Review of Systems  Constitutional: Negative for fever and chills.  Respiratory: Positive for cough. Negative for shortness of breath.   Cardiovascular: Negative for chest pain.  Gastrointestinal: Negative for nausea, vomiting, abdominal pain and blood in stool.  Genitourinary: Positive for flank pain. Negative for dysuria and hematuria.  Musculoskeletal: Positive for back pain.  Neurological: Negative for headaches.  Hematological: Does not bruise/bleed easily.    Vital Signs: BP 155/82 mmHg  Pulse 70  Temp(Src) 98.2 F (36.8 C)  Resp 16  SpO2 100%  Physical Exam  Constitutional: He is oriented to person, place, and time. He appears well-developed and well-nourished.  Cardiovascular: Normal rate and regular rhythm.   Pulmonary/Chest: Effort normal and breath sounds normal.  Abdominal: Soft. Bowel sounds are normal. There is no tenderness.  Mild left CVA tenderness  Musculoskeletal:  Normal range of motion. He exhibits no edema.  Neurological: He is alert and oriented to person, place, and time.    Mallampati Score:     Imaging: No results found.  Labs:  CBC:  Recent Labs  08/29/14 0932 03/13/15 0943 05/27/15 0900 05/30/15 0705  WBC 6.2 7.2 5.9 5.8  HGB 14.3 13.3 14.0 13.5  HCT 43.0 39.3 42.4 40.4  PLT 216.0 198 218 213    COAGS:  Recent Labs  05/30/15 0705  INR 1.00  APTT 28    BMP:  Recent Labs  08/29/14 0932 03/13/15 0943 05/27/15 0900  NA 139 137 139  K 4.1 3.9 4.3  CL 102 103 102  CO2 GLUCOSE 90 104* 102*  BUN CALCIUM 10.8* 10.7* 10.5*  CREATININE 1.2 1.11 1.06  GFRNONAA  --  62* >60  GFRAA  --  72* >60    LIVER FUNCTION TESTS:  Recent Labs  08/29/14 0932  BILITOT 0.5  AST 40*  ALT 30  ALKPHOS 66  PROT 7.1  ALBUMIN 3.6    TUMOR MARKERS: No results for input(s): AFPTM, CEA, CA199, CHROMGRNA in the last 8760 hours.  Assessment and Plan: BRANNEN KOPPEN is a 80 y.o. male with history of bilateral nephrolithiasis and partial left UPJ obstruction who presents today for percutaneous nephroureteral catheter placement prior to nephrolithotomy. Risks and benefits discussed with the patient/wife including, but not limited to infection, bleeding, significant bleeding causing loss or decrease in renal function or damage to adjacent structures. All of the patient's questions were answered, patient is agreeable to proceed.Consent signed and in chart.       Signed: D. Jeananne Rama 05/30/2015, 8:06 AM   I spent a total of 30 minutes in face to face in clinical consultation, greater than 50% of which was counseling/coordinating care for left percutaneous  nephroureteral cath placement

## 2015-05-30 NOTE — Sedation Documentation (Addendum)
Pt became transiently significantly bradycardic, rate as low as 23, when passing the nephroureteral catheter past the stone.  Pt was asymptomatic, not requiring intervention with spontaneous resolution after approximately 1 minute. Otherwise, hemodynamically stable.

## 2015-05-30 NOTE — Anesthesia Procedure Notes (Signed)
Procedure Name: Intubation Date/Time: 05/30/2015 10:40 AM Performed by: Delphia GratesHANDLER, Solomia Harrell Pre-anesthesia Checklist: Patient identified, Emergency Drugs available, Suction available and Patient being monitored Patient Re-evaluated:Patient Re-evaluated prior to inductionOxygen Delivery Method: Circle system utilized Preoxygenation: Pre-oxygenation with 100% oxygen Intubation Type: IV induction Laryngoscope Size: Mac and 4 Grade View: Grade I Tube type: Oral Tube size: 7.5 mm Number of attempts: 1 Airway Equipment and Method: Stylet Placement Confirmation: ETT inserted through vocal cords under direct vision and positive ETCO2 Secured at: 21 cm Tube secured with: Tape Dental Injury: Teeth and Oropharynx as per pre-operative assessment

## 2015-05-30 NOTE — Anesthesia Preprocedure Evaluation (Addendum)
Anesthesia Evaluation  Patient identified by MRN, date of birth, ID band Patient awake    Reviewed: Allergy & Precautions, NPO status , Patient's Chart, lab work & pertinent test results  History of Anesthesia Complications Negative for: history of anesthetic complications  Airway Mallampati: II  TM Distance: >3 FB Neck ROM: Full    Dental  (+) Poor Dentition, Dental Advisory Given   Pulmonary asthma , COPD COPD inhaler,  breath sounds clear to auscultation        Cardiovascular hypertension, Pt. on medications - angina+ Peripheral Vascular Disease Rhythm:Regular Rate:Normal     Neuro/Psych  Neuromuscular disease (myasthenia gravis, no meds)    GI/Hepatic Neg liver ROS, GERD-  Medicated and Controlled,  Endo/Other  negative endocrine ROS  Renal/GU stones     Musculoskeletal  (+) Arthritis -, Osteoarthritis,    Abdominal   Peds  Hematology negative hematology ROS (+)   Anesthesia Other Findings   Reproductive/Obstetrics                            Anesthesia Physical Anesthesia Plan  ASA: III  Anesthesia Plan: MAC   Post-op Pain Management:    Induction: Intravenous  Airway Management Planned: Oral ETT  Additional Equipment:   Intra-op Plan:   Post-operative Plan: Extubation in OR  Informed Consent: I have reviewed the patients History and Physical, chart, labs and discussed the procedure including the risks, benefits and alternatives for the proposed anesthesia with the patient or authorized representative who has indicated his/her understanding and acceptance.   Dental advisory given  Plan Discussed with: CRNA and Surgeon  Anesthesia Plan Comments: (Plan routine monitors, GETA)        Anesthesia Quick Evaluation

## 2015-05-30 NOTE — Op Note (Signed)
PATIENT:  [name] L Mccandlish  PRE-OPERATIVE DIAGNOSIS: Left renal calculi  POST-OPERATIVE DIAGNOSIS: Same  PROCEDURE: 1. Left percutaneous nephrolithotomy (3 cm) 2. Left double-J stent placement  SURGEON:  Garnett FarmMark C Maevis Mumby  INDICATION: Brian Lisdward L General is a 80 year old male with a history of calculus disease who has in experiencing significant intermittent left flank pain. He was found to have 2 stones in his renal pelvis that were intermittently causing obstruction. He also had stones in the lower pole that were peripherally located. We discussed the treatment options and he has elected to proceed with percutaneous nephrolithotomy.  ANESTHESIA:  General  EBL:  Minimal  DRAINS: 6 French, 26 cm double-J stent in the left ureter and an 18 French Foley catheter.  LOCAL MEDICATIONS USED:  None  SPECIMEN: Stone given to patient   Description of procedure: After informed consent the patient was taken to the operating room and administered general endotracheal anesthesia. He was then moved to the prone position on the operating room table after Foley catheter had been inserted. An official timeout was then performed.  I passed a guidewire down the previously placed nephroureteral stent into the bladder under fluoroscopy. I then remove the stent and made a transverse skin incision over the guidewire. The coaxial catheter was then passed over the guidewire under fluoroscopy and the inner portion removed and a second guidewire was passed into the bladder under fluoroscopy. One guidewire was affixed to the drape as a safety guidewire and the second was used as a working guidewire over which the UroMax nephrostomy dilating balloon was then passed under fluoroscopy into the area the renal pelvis. I inflated the balloon and then passed the 30 French access sheath over the inflated balloon into the area of the renal pelvis under fluoroscopy, deflated the balloon and removed the balloon leaving the  guidewire in place.  The 3826 French rigid nephroscope was then passed through the access sheath into the area of the renal pelvis. I identified the 2 stones. I was able to grasp the first one with 3 prong graspers and removed it easily. The second stone was manipulated so that its long axis was along that of the access sheath and was able to extract this as well. Reevaluation revealed no further stones in the area the renal pelvis. The UPJ did not appear to be particularly stenotic.  I removed the rigid nephroscope and inserted the flexible cystoscope and inspected all calyces that were visible. The upper pole calyceal system was inspected and I found a single Randal's plaque but no free stones. I searched but was unable to identify any lower pole calyces and based on what I could see the access had been achieved through the renal pelvis rather than through the calyx and therefore there was no way I could identify the lower pole calyceal system to access this and remove the 2 peripherally located lower pole calyceal stones. Throughout the procedure there was very little bleeding whatsoever. I therefore elected to proceed with a "tubeless" procedure.  I passed the rigid nephroscope over the working guidewire and then passed the double-J stent over the guidewire down into the bladder under fluoroscopy. As I began to remove the guidewire and noted good curl in the bladder. I then used 2 prong graspers to grasp the stent and position it in the middle of the calyx more toward the upper pole. I then measured the distance to the level where the access passed through the mucosa and then removed the nephroscope and  used the laparoscopic introducer to place FloSeal at this level and on out through the nephrostomy tract as I removed the access sheath. I then closed the skin with a running subcuticular 4-0 Monocryl suture, applied a sterile 4 x 4 and Tegaderm and the patient was awakened and taken to the recovery room in  stable and satisfactory condition. He tolerated the procedure well no intraoperative complications.   PLAN OF CARE: He will be observed overnight and discharged home in the morning.  PATIENT DISPOSITION:  PACU - hemodynamically stable.

## 2015-05-30 NOTE — Discharge Instructions (Signed)

## 2015-05-31 DIAGNOSIS — N200 Calculus of kidney: Secondary | ICD-10-CM | POA: Diagnosis not present

## 2015-05-31 NOTE — Progress Notes (Signed)
Urology Inpatient Progress Report LEFT RENAL STONES 05/30/2015  Intv/Subj: Patient is status post left percutaneous nephrolithotomy .  He had some bradycardia yesterday afternoon, he has had no issues since. No acute events overnight. Patient is without complaint.  Past Medical History  Diagnosis Date  . Asthma   . GERD (gastroesophageal reflux disease)   . Hypertension   . Arthritis   . Nephrolithiasis     BILATERAL  . History of kidney stones   . BPH (benign prostatic hypertrophy)   . Mild carotid artery disease     bilateral ICA 0-39%  per duplex 02-16-2013  . Multiple pulmonary nodules     per last CT stable  . History of myasthenia gravis     15 yrs ago - no current problem or treatment   Current Facility-Administered Medications  Medication Dose Route Frequency Provider Last Rate Last Dose  . acetaminophen (TYLENOL) tablet 1,000 mg  1,000 mg Oral 4 times per day Ihor GullyMark Ottelin, MD   1,000 mg at 05/31/15 0617  . ciprofloxacin (CIPRO) IVPB 200 mg  200 mg Intravenous Q12H Ihor GullyMark Ottelin, MD   200 mg at 05/30/15 2147  . dextrose 5 %-0.45 % sodium chloride infusion   Intravenous Continuous Ihor GullyMark Ottelin, MD 150 mL/hr at 05/31/15 0756    . HYDROmorphone (DILAUDID) injection 0.5-1 mg  0.5-1 mg Intravenous Q2H PRN Ihor GullyMark Ottelin, MD   1 mg at 05/30/15 1629  . ondansetron (ZOFRAN) injection 4 mg  4 mg Intravenous Q4H PRN Ihor GullyMark Ottelin, MD   4 mg at 05/30/15 2201  . oxybutynin (DITROPAN) tablet 5 mg  5 mg Oral Q8H PRN Ihor GullyMark Ottelin, MD      . oxyCODONE-acetaminophen (PERCOCET/ROXICET) 5-325 MG per tablet 1-2 tablet  1-2 tablet Oral Q4H PRN Ihor GullyMark Ottelin, MD      . pantoprazole (PROTONIX) EC tablet 40 mg  40 mg Oral Daily Crist FatBenjamin W Herrick, MD   40 mg at 05/30/15 2314     Objective: Vital: Filed Vitals:   05/30/15 1814 05/30/15 2105 05/31/15 0324 05/31/15 0604  BP: 131/72 181/80 151/71 137/52  Pulse: 82 87 77 78  Temp:  97.5 F (36.4 C) 98.5 F (36.9 C) 97.9 F (36.6 C)  TempSrc:   Oral Oral Oral  Resp: 18 18 18 18   Height:      Weight:      SpO2: 98% 100% 98% 97%   I/Os: I/O last 3 completed shifts: In: 3820 [I.V.:3720; IV Piggyback:100] Out: 2775 [Urine:2675; Blood:100]  Physical Exam:  General: Patient is in no apparent distress Lungs: Normal respiratory effort, chest expands symmetrically. GI: The abdomen is soft and nontender without mass.  Patient's left flank incision is clean dry and intact  Foley catheter is draining clear yellow urine Ext: lower extremities symmetric  Lab Results:  Recent Labs  05/30/15 0705  WBC 5.8  HGB 13.5  HCT 40.4   No results for input(s): NA, K, CL, CO2, GLUCOSE, BUN, CREATININE, CALCIUM in the last 72 hours.  Recent Labs  05/30/15 0705  INR 1.00   No results for input(s): LABURIN in the last 72 hours. Results for orders placed or performed during the hospital encounter of 03/13/15  Urine culture     Status: None   Collection Time: 03/13/15 10:28 AM  Result Value Ref Range Status   Specimen Description URINE, CLEAN CATCH  Final   Special Requests NONE  Final   Colony Count NO GROWTH [facility]formed at [facility]   Final  Culture NO GROWTH Performed at [facility]   Final   Report Status 03/15/2015 FINAL  Final    Studies/Results:   Assessment: 1 Day Post-Op  Status post left PCNL.  Patient has been very well postoperatively.  Some bradycardia initially, this is improved and he has had no ongoing or further issues.  His pain is well-controlled.  He is tolerating regular diet.  He is up walking around.  His Foley catheter is clear.  His hemoglobin is stable. Plan: The plan is to discharge the patient once his Foley catheter has been removed and he proves that he is voiding independently.  Berniece Salines W 05/31/2015, 10:23 AM

## 2015-05-31 NOTE — Discharge Summary (Signed)
Date of admission: 05/30/2015  Date of discharge: 05/31/2015  Admission diagnosis: Left renal calculus  Discharge diagnosis: Same, status post left percutaneous nephrolithotomy.    Secondary diagnoses:  Patient Active Problem List   Diagnosis Date Noted  . Renal calculus, left 05/30/2015  . Recurrent kidney stones 03/18/2015  . History of multiple pulmonary nodules 04/10/2014  . ERECTILE DYSFUNCTION, ORGANIC 05/16/2009  . OSTEOARTHRITIS 11/20/2008  . MIGRAINE, OPHTHALMIC 03/21/2007  . MYASTHENIA GRAVIS W/O (ACUTE) EXACERBATION 03/21/2007  . Essential hypertension 03/21/2007  . Allergic rhinitis 03/21/2007  . Asthma 03/21/2007  . GERD 03/21/2007  . HERNIA 03/21/2007    History and Physical: For full details, please see admission history and physical. Briefly, [name] is a 80 y.o. year old patient with Symptomatic left kidney stones.   Hospital Course: Patient tolerated the procedure well.  He was then transferred to the floor after an uneventful PACU stay.  His hospital course was uncomplicated.  On POD#1  he had met discharge criteria: was eating a regular diet, was up and ambulating independently,  pain was well controlled, was voiding without a catheter, and was ready to for discharge.   Laboratory values:   Recent Labs  05/30/15 0705  WBC 5.8  HGB 13.5  HCT 40.4   No results for input(s): NA, K, CL, CO2, GLUCOSE, BUN, CREATININE, CALCIUM in the last 72 hours.  Recent Labs  05/30/15 0705  INR 1.00   No results for input(s): LABURIN in the last 72 hours. Results for orders placed or performed during the hospital encounter of 03/13/15  Urine culture     Status: None   Collection Time: 03/13/15 10:28 AM  Result Value Ref Range Status   Specimen Description URINE, CLEAN CATCH  Final   Special Requests NONE  Final   Colony Count NO GROWTH Performed at [facility]   Final   Culture NO GROWTH Performed at [facility]   Final   Report  Status 03/15/2015 FINAL  Final    Disposition: Home  Discharge instruction: The patient was instructed to be ambulatory but told to refrain from heavy lifting, strenuous activity, or driving.   Discharge medications:   Medication List    STOP taking these medications        ciprofloxacin 500 MG tablet  Commonly known as:  CIPRO     oxyCODONE-acetaminophen 5-325 MG per tablet  Commonly known as:  PERCOCET/ROXICET  Replaced by:  oxyCODONE-acetaminophen 10-325 MG per tablet      TAKE these medications        aspirin 81 MG EC tablet  Take 81 mg by mouth every morning.     calcium carbonate 500 MG chewable tablet  Commonly known as:  TUMS - dosed in mg elemental calcium  Chew 1 tablet by mouth as needed for indigestion or heartburn.     Fish Oil 1200 MG Caps  Take 1 capsule by mouth every morning.     fluticasone 44 MCG/ACT inhaler  Commonly known as:  FLOVENT HFA  Inhale 1 puff into the lungs 2 (two) times daily.     ibuprofen 200 MG tablet  Commonly known as:  ADVIL,MOTRIN  Take 200 mg by mouth every 6 (six) hours as needed for fever or moderate pain.     lisinopril-hydrochlorothiazide 10-12.5 MG per tablet  Commonly known as:  PRINZIDE,ZESTORETIC  Take 1 tablet by mouth daily.     montelukast 10 MG tablet  Commonly known as:  SINGULAIR  Take  1 tablet (10 mg total) by mouth at bedtime.     multivitamin tablet  Take 1 tablet by mouth every morning.     Oxycodone HCl 10 MG Tabs  Take 1 tablet (10 mg total) by mouth every 4 (four) hours as needed.     oxyCODONE-acetaminophen 10-325 MG per tablet  Commonly known as:  PERCOCET  Take 1-2 tablets by mouth every 4 (four) hours as needed for pain. Maximum dose per 24 hours - 8 pills     phenazopyridine 200 MG tablet  Commonly known as:  PYRIDIUM  Take 1 tablet (200 mg total) by mouth 3 (three) times daily as needed for pain.     phenazopyridine 200 MG tablet  Commonly known as:  PYRIDIUM  Take 1 tablet (200 mg  total) by mouth 3 (three) times daily as needed for pain.     polyvinyl alcohol 1.4 % ophthalmic solution  Commonly known as:  LIQUIFILM TEARS  Place 1 drop into both eyes daily as needed for dry eyes.        Followup:      Follow-up Information    Follow up with Claybon Jabs, MD On 06/09/2015.   Specialty:  Urology   Why:  For your appiontment at 1:00   Contact information:   [place] [phone]

## 2015-06-02 ENCOUNTER — Encounter (HOSPITAL_COMMUNITY): Payer: Self-pay | Admitting: Urology

## 2015-09-10 ENCOUNTER — Ambulatory Visit (INDEPENDENT_AMBULATORY_CARE_PROVIDER_SITE_OTHER): Payer: Medicare Other | Admitting: Family Medicine

## 2015-09-10 ENCOUNTER — Encounter: Payer: Self-pay | Admitting: Family Medicine

## 2015-09-10 VITALS — BP 130/90 | Temp 97.9°F | Ht 64.0 in | Wt 157.0 lb

## 2015-09-10 DIAGNOSIS — J301 Allergic rhinitis due to pollen: Secondary | ICD-10-CM

## 2015-09-10 DIAGNOSIS — J4521 Mild intermittent asthma with (acute) exacerbation: Secondary | ICD-10-CM

## 2015-09-10 DIAGNOSIS — I10 Essential (primary) hypertension: Secondary | ICD-10-CM

## 2015-09-10 DIAGNOSIS — Z8042 Family history of malignant neoplasm of prostate: Secondary | ICD-10-CM | POA: Diagnosis not present

## 2015-09-10 DIAGNOSIS — G7000 Myasthenia gravis without (acute) exacerbation: Secondary | ICD-10-CM

## 2015-09-10 DIAGNOSIS — Z23 Encounter for immunization: Secondary | ICD-10-CM | POA: Diagnosis not present

## 2015-09-10 DIAGNOSIS — Z0000 Encounter for general adult medical examination without abnormal findings: Secondary | ICD-10-CM

## 2015-09-10 DIAGNOSIS — K219 Gastro-esophageal reflux disease without esophagitis: Secondary | ICD-10-CM

## 2015-09-10 DIAGNOSIS — J4520 Mild intermittent asthma, uncomplicated: Secondary | ICD-10-CM

## 2015-09-10 LAB — HEPATIC FUNCTION PANEL
ALBUMIN: 4.3 g/dL (ref 3.5–5.2)
ALT: 20 U/L (ref 0–53)
AST: 30 U/L (ref 0–37)
Alkaline Phosphatase: 72 U/L (ref 39–117)
Bilirubin, Direct: 0.1 mg/dL (ref 0.0–0.3)
TOTAL PROTEIN: 7.2 g/dL (ref 6.0–8.3)
Total Bilirubin: 0.5 mg/dL (ref 0.2–1.2)

## 2015-09-10 LAB — LIPID PANEL
CHOL/HDL RATIO: 4
Cholesterol: 200 mg/dL (ref 0–200)
HDL: 45.9 mg/dL (ref >39.00)
LDL Cholesterol: 128 mg/dL — ABNORMAL HIGH (ref 0–99)
NONHDL: 154.43
Triglycerides: 133 mg/dL (ref 0.0–149.0)
VLDL: 26.6 mg/dL (ref 0.0–40.0)

## 2015-09-10 LAB — CBC WITH DIFFERENTIAL/PLATELET
BASOS ABS: 0 10*3/uL (ref 0.0–0.1)
Basophils Relative: 0.6 % (ref 0.0–3.0)
EOS ABS: 0.3 10*3/uL (ref 0.0–0.7)
Eosinophils Relative: 3.9 % (ref 0.0–5.0)
HEMATOCRIT: 45.3 % (ref 39.0–52.0)
Hemoglobin: 15.1 g/dL (ref 13.0–17.0)
LYMPHS ABS: 2.1 10*3/uL (ref 0.7–4.0)
Lymphocytes Relative: 28.9 % (ref 12.0–46.0)
MCHC: 33.4 g/dL (ref 30.0–36.0)
MCV: 91.3 fl (ref 78.0–100.0)
MONOS PCT: 9.5 % (ref 3.0–12.0)
Monocytes Absolute: 0.7 10*3/uL (ref 0.1–1.0)
NEUTROS PCT: 57.1 % (ref 43.0–77.0)
Neutro Abs: 4.1 10*3/uL (ref 1.4–7.7)
Platelets: 220 10*3/uL (ref 150.0–400.0)
RBC: 4.96 Mil/uL (ref 4.22–5.81)
RDW: 14.2 % (ref 11.5–15.5)
WBC: 7.1 10*3/uL (ref 4.0–10.5)

## 2015-09-10 LAB — TSH: TSH: 1.34 u[IU]/mL (ref 0.35–4.50)

## 2015-09-10 LAB — BASIC METABOLIC PANEL
BUN: 14 mg/dL (ref 6–23)
CALCIUM: 11.5 mg/dL — AB (ref 8.4–10.5)
CO2: 32 mEq/L (ref 19–32)
Chloride: 104 mEq/L (ref 96–112)
Creatinine, Ser: 1.13 mg/dL (ref 0.40–1.50)
GFR: 80.72 mL/min (ref >60.00)
GLUCOSE: 99 mg/dL (ref 70–99)
POTASSIUM: 5.1 meq/L (ref 3.5–5.1)
SODIUM: 142 meq/L (ref 135–145)

## 2015-09-10 LAB — POCT URINALYSIS DIPSTICK
BILIRUBIN UA: NEGATIVE
Glucose, UA: NEGATIVE
Ketones, UA: NEGATIVE
LEUKOCYTES UA: NEGATIVE
NITRITE UA: NEGATIVE
PH UA: 6.5
PROTEIN UA: NEGATIVE
Spec Grav, UA: 1.015
UROBILINOGEN UA: 0.2

## 2015-09-10 LAB — PSA: PSA: 2.82 ng/mL (ref 0.10–4.00)

## 2015-09-10 MED ORDER — LISINOPRIL-HYDROCHLOROTHIAZIDE 10-12.5 MG PO TABS: 1.0000 | ORAL_TABLET | Freq: Every day | ORAL | Status: DC

## 2015-09-10 MED ORDER — MONTELUKAST SODIUM 10 MG PO TABS: 10.0000 mg | ORAL_TABLET | Freq: Every day | ORAL | Status: DC

## 2015-09-10 MED ORDER — FLUTICASONE PROPIONATE HFA 44 MCG/ACT IN AERO: 1.0000 | INHALATION_SPRAY | Freq: Two times a day (BID) | RESPIRATORY_TRACT | Status: DC

## 2015-09-10 NOTE — Progress Notes (Signed)
Pre visit review using our clinic review tool, if applicable. No additional management support is needed unless otherwise documented below in the visit note. 

## 2015-09-10 NOTE — Progress Notes (Signed)
   Subjective:    Patient ID: [name], male    DOB: 09/27/1935, 80 y.o.   MRN: [id number]  HPI [name] is a 80 year old married male nonsmoker who comes in today for general physical examination because of a history of allergic rhinitis, asthma, hypertension  His asthma is well control with Flovent 44 dose one puff twice a day He takes Singulair for allergic rhinitis and asthmaHe takes Zestoretic 1012 0.5 daily for hypertension BP 130/90 BP at home 130/80  He does not get routine eye care nor dental care. Advised to get these this fall.  Colonoscopy 2011 normal.  Vaccinations flu shot was given today also the vaccinations are up-to-date  Cognitive function normal he walks daily home health safety reviewed no issues identified, no guns in the house, he does have a healthcare power of attorney and living well   Review of Systems  Constitutional: Negative.   HENT: Negative.   Eyes: Negative.   Respiratory: Negative.   Cardiovascular: Negative.   Gastrointestinal: Negative.   Endocrine: Negative.   Genitourinary: Negative.   Musculoskeletal: Negative.   Skin: Negative.   Allergic/Immunologic: Negative.   Neurological: Negative.   Hematological: Negative.   Psychiatric/Behavioral: Negative.        Objective:   Physical Exam  Constitutional: He is oriented to person, place, and time. He appears well-developed and well-nourished.  HENT:  Head: Normocephalic and atraumatic.  Right Ear: External ear normal.  Left Ear: External ear normal.  Nose: Nose normal.  Mouth/Throat: Oropharynx is clear and moist.  Eyes: Conjunctivae and EOM are normal. Pupils are equal, round, and reactive to light.  Neck: Normal range of motion. Neck supple. No JVD present. No tracheal deviation present. No thyromegaly present.  Cardiovascular: Normal rate, regular rhythm, normal heart sounds and intact distal pulses.  Exam reveals no gallop and no friction rub.   No murmur  heard. Pulmonary/Chest: Effort normal and breath sounds normal. No stridor. No respiratory distress. He has no wheezes. He has no rales. He exhibits no tenderness.  Abdominal: Soft. Bowel sounds are normal. He exhibits no distension and no mass. There is no tenderness. There is no rebound and no guarding.  Genitourinary: Rectum normal, prostate normal and penis normal. Guaiac negative stool. No penile tenderness.  Prostate gland amazingly normal. Family history of prostate cancer his father  Musculoskeletal: Normal range of motion. He exhibits no edema or tenderness.  Lymphadenopathy:    He has no cervical adenopathy.  Neurological: He is alert and oriented to person, place, and time. He has normal reflexes. No cranial nerve deficit. He exhibits normal muscle tone.  Skin: Skin is warm and dry. No rash noted. No erythema. No pallor.  Psychiatric: He has a normal mood and affect. His behavior is normal. Judgment and thought content normal.  Nursing note and vitals reviewed.         Assessment & Plan:  Healthy male  Allergic rhinitis,,,,,,,, continue Singulair  Asthma,,,,,,,,,, continue Flovent  Hypertension,,,,,,,, continue Zestoretic 10-12 0.5 daily  Recommend screening eye and dental checks

## 2015-09-10 NOTE — Patient Instructions (Signed)
Continue current medications  Start a walking program,,,,,,,,,,, walk 30 minutes daily  Return in one year for general physical examination sooner if any problem,,,,,,,,,,, when you: July,,,,,, for October,,,,,,,, [name] or Brian FanningJulie

## 2015-09-15 ENCOUNTER — Other Ambulatory Visit: Payer: Self-pay | Admitting: Family Medicine

## 2015-09-15 DIAGNOSIS — R899 Unspecified abnormal finding in specimens from other organs, systems and tissues: Secondary | ICD-10-CM

## 2015-09-17 ENCOUNTER — Other Ambulatory Visit (INDEPENDENT_AMBULATORY_CARE_PROVIDER_SITE_OTHER): Payer: Medicare Other

## 2015-09-17 DIAGNOSIS — R899 Unspecified abnormal finding in specimens from other organs, systems and tissues: Secondary | ICD-10-CM

## 2015-09-17 LAB — PHOSPHORUS: Phosphorus: 2.4 mg/dL (ref 2.3–4.6)

## 2015-09-17 LAB — CALCIUM: CALCIUM: 10.8 mg/dL — AB (ref 8.4–10.5)

## 2015-09-18 LAB — PTH, INTACT AND CALCIUM
CALCIUM: 10.6 mg/dL — AB (ref 8.4–10.5)
PTH: 61 pg/mL (ref 14–64)

## 2015-10-02 ENCOUNTER — Other Ambulatory Visit: Payer: Self-pay | Admitting: Family Medicine

## 2015-10-02 DIAGNOSIS — R899 Unspecified abnormal finding in specimens from other organs, systems and tissues: Secondary | ICD-10-CM

## 2015-10-08 ENCOUNTER — Encounter: Payer: Self-pay | Admitting: Internal Medicine

## 2015-10-23 ENCOUNTER — Other Ambulatory Visit: Payer: Self-pay | Admitting: Family Medicine

## 2015-10-24 ENCOUNTER — Other Ambulatory Visit: Payer: Self-pay | Admitting: Family Medicine

## 2015-10-24 DIAGNOSIS — R7989 Other specified abnormal findings of blood chemistry: Secondary | ICD-10-CM

## 2015-11-19 ENCOUNTER — Encounter: Payer: Self-pay | Admitting: Endocrinology

## 2015-11-19 ENCOUNTER — Ambulatory Visit (INDEPENDENT_AMBULATORY_CARE_PROVIDER_SITE_OTHER): Payer: Medicare Other | Admitting: Endocrinology

## 2015-11-19 LAB — VITAMIN D 25 HYDROXY (VIT D DEFICIENCY, FRACTURES): VITD: 28.41 ng/mL — ABNORMAL LOW (ref 30.00–100.00)

## 2015-11-19 NOTE — Progress Notes (Signed)
Subjective:    Patient ID: [name], male    DOB: 08/19/1935, 81 y.o.   MRN: [id number]  HPI Pt was noted to have mild hypercalcemia in 2009 (it was normal in 2009). He has never had associated osteoporosis, thyroid probs, sarcoidosis, cancer, PUD, pancreatitis, depression, or bony fracture.  He does not take specific vitamin-D or A supplements (multivitamin only).  Pt has no h/o prolonged immobilization.  Pt does not take Li++, but he takes "tums."  He has a long h/o oxalate urolithiasis, requiring stenting of both ureters.   Past Medical History  Diagnosis Date  . Asthma   . GERD (gastroesophageal reflux disease)   . Hypertension   . Arthritis   . Nephrolithiasis     BILATERAL  . History of kidney stones   . BPH (benign prostatic hypertrophy)   . Mild carotid artery disease (HCC)     bilateral ICA 0-39%  per duplex 02-16-2013  . Multiple pulmonary nodules     per last CT stable  . History of myasthenia gravis     15 yrs ago - no current problem or treatment    Past Surgical History  Procedure Laterality Date  . Cataract extraction w/ intraocular lens  implant, bilateral  2007  . Extracorporeal shock wave lithotripsy Bilateral left 02-23-2010 //   right 09-28-2010  . Cysto/  left retrograde pyelogram/  left ureteroscopy/  left stent placement  06-17-2008  . Colonoscopy w/ polypectomy  05-14-2010  . Inguinal hernia repair Bilateral 2002 approx.  . Cystoscopy w/ retrogrades Bilateral 03/24/2015    Procedure: CYSTOSCOPY WITH RETROGRADE PYELOGRAM;  Surgeon: Ihor GullyMark Ottelin, MD;  [facility] [place];  Service: Urology;  Laterality: Bilateral;  . Cystoscopy with ureteroscopy Right 03/24/2015    Procedure: CYSTOSCOPY WITH URETEROSCOPY;  Surgeon: Ihor GullyMark Ottelin, MD;  [facility];  Service: Urology;  Laterality: Right;  . Cystoscopy with stent placement Right 03/24/2015    Procedure: CYSTOSCOPY WITH STENT PLACEMENT;  Surgeon: Ihor GullyMark Ottelin, MD;   [facility] [place];  Service: Urology;  Laterality: Right;  . Holmium laser application Right 03/24/2015    Procedure: HOLMIUM LASER APPLICATION;  Surgeon: Ihor GullyMark Ottelin, MD;  [facility] [place];  Service: Urology;  Laterality: Right;  . Stone extraction with basket Right 03/24/2015    Procedure: STONE EXTRACTION WITH BASKET;  Surgeon: Ihor GullyMark Ottelin, MD;  [facility] [place];  Service: Urology;  Laterality: Right;  . Nephrolithotomy Left 05/30/2015    Procedure: LEFT PERCUTANEOUS NEPHROLITHOTOMY;  Surgeon: Ihor GullyMark Ottelin, MD;  Location: [facility];  Service: Urology;  Laterality: Left;    Social History   Social History  . Marital Status: Married    Spouse Name: N/A  . Number of Children: N/A  . Years of Education: N/A   Occupational History  . Not on file.   Social History Main Topics  . Smoking status: Never Smoker   . Smokeless tobacco: Never Used  . Alcohol Use: No  . Drug Use: No  . Sexual Activity: Not on file   Other Topics Concern  . Not on file   Social History Narrative    Current Outpatient Prescriptions on File Prior to Visit  Medication Sig Dispense Refill  . aspirin 81 MG EC tablet Take 81 mg by mouth every morning.     . fluticasone (FLOVENT HFA) 44 MCG/ACT inhaler Inhale 1 puff into the lungs 2 (two) times daily. 3 Inhaler 3  . ibuprofen (ADVIL,MOTRIN)  200 MG tablet Take 200 mg by mouth every 6 (six) hours as needed for fever or moderate pain.     Marland Kitchen lisinopril-hydrochlorothiazide (PRINZIDE,ZESTORETIC) 10-12.5 MG tablet Take 1 tablet by mouth daily. 90 tablet 3  . montelukast (SINGULAIR) 10 MG tablet Take 1 tablet (10 mg total) by mouth at bedtime. 90 tablet 3  . Omega-3 Fatty Acids (FISH OIL) 1200 MG CAPS Take 1 capsule by mouth every morning.     . calcium carbonate (TUMS - DOSED IN MG ELEMENTAL CALCIUM) 500 MG chewable tablet Chew 1 tablet by mouth as needed for indigestion or heartburn. Reported on 11/19/2015    .  montelukast (SINGULAIR) 10 MG tablet TAKE 1 BY MOUTH AT BEDTIME (Patient not taking: Reported on 11/19/2015) 90 tablet 3  . Multiple Vitamin (MULTIVITAMIN) tablet Take 1 tablet by mouth every morning. Reported on 11/19/2015    . oxyCODONE-acetaminophen (PERCOCET) 10-325 MG per tablet Take 1-2 tablets by mouth every 4 (four) hours as needed for pain. Maximum dose per 24 hours - 8 pills (Patient not taking: Reported on 11/19/2015) 30 tablet 0  . polyvinyl alcohol (LIQUIFILM TEARS) 1.4 % ophthalmic solution Place 1 drop into both eyes daily as needed for dry eyes. Reported on 11/19/2015     No current facility-administered medications on file prior to visit.    No Known Allergies  Family History  Problem Relation Age of Onset  . Heart disease Mother   . Hypertension Mother   . Kidney disease Mother   . Heart disease Father   . Hypertension Father   . Hypercalcemia Neg Hx     BP 136/87 mmHg  Pulse 84  Temp(Src) 98.2 F (36.8 C) (Oral)  Ht 5\' 4"  (1.626 m)  Wt 162 lb (73.483 kg)  BMI 27.79 kg/m2  SpO2 95%  Review of Systems denies weight loss, gynecomastia, memory loss, erectile dysfunction, numbness, arthralgias, abdominal pain, urinary frequency, hypoglycemia, skin rash, visual loss, sob, diarrhea, rhinorrhea, and easy bruising.      Objective:   Physical Exam VS: see vs page GEN: no distress HEAD: head: no deformity eyes: no periorbital swelling, no proptosis external nose and ears are normal mouth: no lesion seen NECK: supple, thyroid is not enlarged CHEST WALL: no deformity LUNGS: clear to auscultation BREASTS:  No gynecomastia CV: reg rate and rhythm, no murmur ABD: abdomen is soft, nontender.  no hepatosplenomegaly.  not distended.  no hernia MUSCULOSKELETAL: muscle bulk and strength are grossly normal.  no obvious joint swelling.  gait is normal and steady EXTEMITIES: no deformity.  no edema PULSES: no carotid bruit NEURO:  cn 2-12 grossly intact.   readily moves all 4's.   sensation is intact to touch on all 4's.  SKIN:  Normal texture and temperature.  No rash or suspicious lesion is visible.   NODES:  None palpable at the neck PSYCH: alert, well-oriented.  Does not appear anxious nor depressed.   Lab Results  Component Value Date   PTH 61 09/17/2015   CALCIUM 10.8* 09/17/2015   CALCIUM 10.6* 09/17/2015   PHOS 2.4 09/17/2015   I have reviewed outside records, and summarized: Pt was noted to have elevated PTH, and referred here.  CT: bilat ureteral stones.    Assessment & Plan:  Probable primary hyperparathyroidism, new, mild Hypercalcemia: prob exac by HCTZ Urolithiasis: in this context, I hesitate to d/c HCTZ, as the discontinuation could cause hypercalciuria.    Patient is advised the following: Patient Instructions  I'm not sure why you  retaining calcium.   blood tests are requested for you today.  We'll let you know about the results.

## 2015-11-19 NOTE — Patient Instructions (Addendum)
I'm not sure why you retaining calcium.   blood tests are requested for you today.  We'll let you know about the results.

## 2015-11-20 LAB — PTH, INTACT AND CALCIUM
Calcium: 11 mg/dL — ABNORMAL HIGH (ref 8.4–10.5)
PTH: 78 pg/mL — ABNORMAL HIGH (ref 14–64)

## 2015-11-21 LAB — PROTEIN ELECTROPHORESIS, SERUM
ALBUMIN ELP: 4.2 g/dL (ref 3.8–4.8)
Alpha-1-Globulin: 0.2 g/dL (ref 0.2–0.3)
Alpha-2-Globulin: 0.6 g/dL (ref 0.5–0.9)
Beta 2: 0.5 g/dL (ref 0.2–0.5)
Beta Globulin: 0.5 g/dL (ref 0.4–0.6)
Gamma Globulin: 1.1 g/dL (ref 0.8–1.7)
TOTAL PROTEIN, SERUM ELECTROPHOR: 7.1 g/dL (ref 6.1–8.1)

## 2015-11-22 LAB — VITAMIN D 1,25 DIHYDROXY
Vitamin D 1, 25 (OH)2 Total: 55 pg/mL (ref 18–72)
Vitamin D2 1, 25 (OH)2: <8 pg/mL
Vitamin D3 1, 25 (OH)2: 55 pg/mL

## 2015-11-22 LAB — VITAMIN A: VITAMIN A (RETINOIC ACID): 58 ug/dL (ref 38–98)

## 2015-11-25 LAB — PTH-RELATED PEPTIDE: PTH-Related Protein (PTH-RP): 17 pg/mL (ref 14–27)

## 2015-11-26 ENCOUNTER — Other Ambulatory Visit: Payer: Self-pay | Admitting: Endocrinology

## 2015-11-26 DIAGNOSIS — E210 Primary hyperparathyroidism: Secondary | ICD-10-CM | POA: Insufficient documentation

## 2015-12-11 ENCOUNTER — Other Ambulatory Visit: Payer: Self-pay | Admitting: General Surgery

## 2015-12-11 ENCOUNTER — Other Ambulatory Visit (HOSPITAL_COMMUNITY): Payer: Self-pay | Admitting: General Surgery

## 2015-12-11 DIAGNOSIS — E210 Primary hyperparathyroidism: Secondary | ICD-10-CM

## 2015-12-23 ENCOUNTER — Encounter (HOSPITAL_COMMUNITY)
Admission: RE | Admit: 2015-12-23 | Discharge: 2015-12-23 | Disposition: A | Discharge status: Home / Self Care | Payer: Medicare Other | Source: Ambulatory Visit | Attending: General Surgery | Admitting: General Surgery

## 2015-12-23 ENCOUNTER — Ambulatory Visit (HOSPITAL_COMMUNITY)
Admission: RE | Admit: 2015-12-23 | Discharge: 2015-12-23 | Disposition: A | Discharge status: Home / Self Care | Payer: Medicare Other | Source: Ambulatory Visit | Attending: General Surgery | Admitting: General Surgery

## 2015-12-23 DIAGNOSIS — E210 Primary hyperparathyroidism: Secondary | ICD-10-CM | POA: Insufficient documentation

## 2015-12-23 DIAGNOSIS — Z0389 Encounter for observation for other suspected diseases and conditions ruled out: Secondary | ICD-10-CM | POA: Diagnosis not present

## 2015-12-23 DIAGNOSIS — E042 Nontoxic multinodular goiter: Secondary | ICD-10-CM | POA: Insufficient documentation

## 2015-12-23 MED ORDER — TECHNETIUM TC 99M SESTAMIBI - CARDIOLITE
25.8000 | Freq: Once | INTRAVENOUS | Status: AC | PRN
  Administered 2015-12-23: 25.8 via INTRAVENOUS

## 2016-01-20 DIAGNOSIS — E210 Primary hyperparathyroidism: Secondary | ICD-10-CM | POA: Diagnosis not present

## 2016-01-23 DIAGNOSIS — E210 Primary hyperparathyroidism: Secondary | ICD-10-CM | POA: Diagnosis not present

## 2016-02-02 ENCOUNTER — Telehealth: Payer: Self-pay | Admitting: Family Medicine

## 2016-02-02 MED ORDER — LISINOPRIL-HYDROCHLOROTHIAZIDE 10-12.5 MG PO TABS: 1.0000 | ORAL_TABLET | Freq: Every day | ORAL | Status: DC

## 2016-02-02 NOTE — Telephone Encounter (Signed)
New Rx sent.

## 2016-02-02 NOTE — Telephone Encounter (Signed)
Pt request refill  lisinopril-hydrochlorothiazide (PRINZIDE,ZESTORETIC) 10-12.5 MG tablet 90 day   Pt has switched to [facility] and pt states they have sent several request.  (Pt was at [facility], but has switched insurance companies)

## 2016-02-23 DIAGNOSIS — E210 Primary hyperparathyroidism: Secondary | ICD-10-CM | POA: Diagnosis not present

## 2016-03-30 ENCOUNTER — Telehealth: Payer: Self-pay | Admitting: Family Medicine

## 2016-03-30 ENCOUNTER — Other Ambulatory Visit: Payer: Self-pay | Admitting: General Practice

## 2016-03-30 DIAGNOSIS — J4521 Mild intermittent asthma with (acute) exacerbation: Secondary | ICD-10-CM

## 2016-03-30 MED ORDER — FLUTICASONE PROPIONATE HFA 44 MCG/ACT IN AERO: 1.0000 | INHALATION_SPRAY | Freq: Two times a day (BID) | RESPIRATORY_TRACT | Status: DC

## 2016-03-30 NOTE — Telephone Encounter (Signed)
Pt request refill  fluticasone (FLOVENT HFA) 44 MCG/ACT inhaler [[id number]][[id number]]  [facility]

## 2016-11-05 ENCOUNTER — Other Ambulatory Visit: Payer: Self-pay | Admitting: Emergency Medicine

## 2016-11-05 MED ORDER — MONTELUKAST SODIUM 10 MG PO TABS: 10.0000 mg | ORAL_TABLET | Freq: Every day | ORAL | 0 refills | Status: DC

## 2016-11-11 DIAGNOSIS — H40003 Preglaucoma, unspecified, bilateral: Secondary | ICD-10-CM | POA: Diagnosis not present

## 2016-12-06 ENCOUNTER — Ambulatory Visit (INDEPENDENT_AMBULATORY_CARE_PROVIDER_SITE_OTHER): Payer: Medicare Other | Admitting: Family Medicine

## 2016-12-06 ENCOUNTER — Encounter: Payer: Self-pay | Admitting: Family Medicine

## 2016-12-06 VITALS — BP 134/72 | HR 72 | Temp 97.8°F | Ht 64.0 in | Wt 164.0 lb

## 2016-12-06 DIAGNOSIS — Z8042 Family history of malignant neoplasm of prostate: Secondary | ICD-10-CM

## 2016-12-06 DIAGNOSIS — J4520 Mild intermittent asthma, uncomplicated: Secondary | ICD-10-CM | POA: Diagnosis not present

## 2016-12-06 DIAGNOSIS — J301 Allergic rhinitis due to pollen: Secondary | ICD-10-CM | POA: Diagnosis not present

## 2016-12-06 DIAGNOSIS — I10 Essential (primary) hypertension: Secondary | ICD-10-CM | POA: Diagnosis not present

## 2016-12-06 DIAGNOSIS — Z23 Encounter for immunization: Secondary | ICD-10-CM

## 2016-12-06 LAB — CBC WITH DIFFERENTIAL/PLATELET
BASOS PCT: 0.5 % (ref 0.0–3.0)
Basophils Absolute: 0 10*3/uL (ref 0.0–0.1)
EOS PCT: 2.5 % (ref 0.0–5.0)
Eosinophils Absolute: 0.2 10*3/uL (ref 0.0–0.7)
HCT: 42.5 % (ref 39.0–52.0)
Hemoglobin: 14.5 g/dL (ref 13.0–17.0)
LYMPHS ABS: 1.9 10*3/uL (ref 0.7–4.0)
Lymphocytes Relative: 30.8 % (ref 12.0–46.0)
MCHC: 34 g/dL (ref 30.0–36.0)
MCV: 90.5 fl (ref 78.0–100.0)
MONO ABS: 0.5 10*3/uL (ref 0.1–1.0)
MONOS PCT: 8.8 % (ref 3.0–12.0)
NEUTROS ABS: 3.5 10*3/uL (ref 1.4–7.7)
NEUTROS PCT: 57.4 % (ref 43.0–77.0)
PLATELETS: 197 10*3/uL (ref 150.0–400.0)
RBC: 4.7 Mil/uL (ref 4.22–5.81)
RDW: 13.8 % (ref 11.5–15.5)
WBC: 6.1 10*3/uL (ref 4.0–10.5)

## 2016-12-06 LAB — BASIC METABOLIC PANEL
BUN: 15 mg/dL (ref 6–23)
CO2: 31 mEq/L (ref 19–32)
CREATININE: 1.1 mg/dL (ref 0.40–1.50)
Calcium: 10.6 mg/dL — ABNORMAL HIGH (ref 8.4–10.5)
Chloride: 103 mEq/L (ref 96–112)
GFR: 83 mL/min (ref >60.00)
GLUCOSE: 102 mg/dL — AB (ref 70–99)
POTASSIUM: 3.7 meq/L (ref 3.5–5.1)
Sodium: 139 mEq/L (ref 135–145)

## 2016-12-06 LAB — LIPID PANEL
CHOLESTEROL: 187 mg/dL (ref 0–200)
HDL: 46.2 mg/dL (ref >39.00)
LDL Cholesterol: 126 mg/dL — ABNORMAL HIGH (ref 0–99)
NONHDL: 141.05
Total CHOL/HDL Ratio: 4
Triglycerides: 75 mg/dL (ref 0.0–149.0)
VLDL: 15 mg/dL (ref 0.0–40.0)

## 2016-12-06 LAB — POCT URINALYSIS DIPSTICK
BILIRUBIN UA: NEGATIVE
Glucose, UA: NEGATIVE
KETONES UA: NEGATIVE
Nitrite, UA: NEGATIVE
PH UA: 6
PROTEIN UA: NEGATIVE
SPEC GRAV UA: 1.02
Urobilinogen, UA: 0.2

## 2016-12-06 LAB — HEPATIC FUNCTION PANEL
ALK PHOS: 68 U/L (ref 39–117)
ALT: 20 U/L (ref 0–53)
AST: 28 U/L (ref 0–37)
Albumin: 4.1 g/dL (ref 3.5–5.2)
BILIRUBIN DIRECT: 0.1 mg/dL (ref 0.0–0.3)
BILIRUBIN TOTAL: 0.4 mg/dL (ref 0.2–1.2)
Total Protein: 7.1 g/dL (ref 6.0–8.3)

## 2016-12-06 LAB — TSH: TSH: 1.42 u[IU]/mL (ref 0.35–4.50)

## 2016-12-06 LAB — PSA: PSA: 3.29 ng/mL (ref 0.10–4.00)

## 2016-12-06 MED ORDER — MONTELUKAST SODIUM 10 MG PO TABS: ORAL_TABLET | ORAL | 4 refills | Status: DC

## 2016-12-06 MED ORDER — LISINOPRIL-HYDROCHLOROTHIAZIDE 10-12.5 MG PO TABS: 1.0000 | ORAL_TABLET | Freq: Every day | ORAL | 4 refills | Status: DC

## 2016-12-06 NOTE — Progress Notes (Signed)
Pre visit review using our clinic review tool, if applicable. No additional management support is needed unless otherwise documented below in the visit note. 

## 2016-12-06 NOTE — Progress Notes (Signed)
[name] is a 82 year old married male nonsmoker who comes in today for evaluation of hypertension and asthma and allergic rhinitis  For hypertension he takes Zestoretic 10-12 0.5 daily. BP 1 3472 at home  He has a history of allergic rhinitis and asthma. He takes Singulair 10 mg, Flovent 44 one puff twice a day which increases to 2 puffs twice daily if he has a cold. He's doing well and has had no problems this year.  His a history of nocturia 1 or 2 and ED.  He has a history of myasthenia gravis which is stable with no progression of any symptoms.  He gets routine eye care, no dental care. Advised dental care twice yearly. Colonoscopy 2011. Initially had some polyps. Last colonoscopy was normal.  Cognitive function normal he walks daily home health safety reviewed no issues identified, no guns in the house, he does have a healthcare power of attorney and living well  14 point uses she is otherwise negative  EKG was done because of history of hypertension. EKG was unchanged from previous cardiograms.  . Vaccinations up-to-date except seasonal flu shot which will be given today  BP 134/72 (BP Location: Left Arm, Patient Position: Sitting, Cuff Size: Normal)   Pulse 72   Temp 97.8 F (36.6 C) (Oral)   Ht 5\' 4"  (1.626 m)   Wt 164 lb (74.4 kg)   BMI 28.15 kg/m  Examination HEENT were negative neck was supple no adenopathy thyroid normal no carotid bruits cardiopulmonary exam normal abdominal exam normal aorta normal genitalia normal circumcised male rectum normal stool guaiac negative prostate smooth nonnodular 1+ symmetrical BPH extremities normal skin normal peripheral pulses normal  Impression #1 hypertension at goal....... continue current therapy  #2 allergic rhinitis and asthma......... continue Singulair and Flovent  #3 myasthenia gravis......... no progressive symptoms

## 2016-12-06 NOTE — Patient Instructions (Signed)
Continue good diet exercise and health habits  Follow-up in one year sooner if any problems  Labs today.........Marland Kitchen. we will call you if there is anything abnormal

## 2017-01-03 ENCOUNTER — Telehealth: Payer: Self-pay | Admitting: Emergency Medicine

## 2017-01-03 NOTE — Telephone Encounter (Signed)
Left a voicemail for pt to return office call regarding rx for Montelukast. Wondering whether or not pt received medication.

## 2017-01-03 NOTE — Telephone Encounter (Signed)
Spoke with and and he stated that he did receive the Singular and per pt he uses [facility] as pharmacy

## 2017-01-03 NOTE — Telephone Encounter (Signed)
Pt returned your call.  

## 2017-01-04 ENCOUNTER — Other Ambulatory Visit: Payer: Self-pay | Admitting: Emergency Medicine

## 2017-01-04 DIAGNOSIS — N200 Calculus of kidney: Secondary | ICD-10-CM | POA: Diagnosis not present

## 2017-03-02 ENCOUNTER — Other Ambulatory Visit: Payer: Self-pay | Admitting: Family Medicine

## 2017-03-02 MED ORDER — FLUTICASONE PROPIONATE HFA 44 MCG/ACT IN AERO: 1.0000 | INHALATION_SPRAY | Freq: Two times a day (BID) | RESPIRATORY_TRACT | 2 refills | Status: DC

## 2017-03-02 NOTE — Telephone Encounter (Signed)
Sent to the pharmacy by [facility]. 

## 2017-04-06 ENCOUNTER — Ambulatory Visit (INDEPENDENT_AMBULATORY_CARE_PROVIDER_SITE_OTHER): Payer: Medicare Other

## 2017-04-06 VITALS — BP 140/80 | HR 78 | Ht 65.5 in | Wt 155.0 lb

## 2017-04-06 DIAGNOSIS — Z0000 Encounter for general adult medical examination without abnormal findings: Secondary | ICD-10-CM

## 2017-04-06 NOTE — Patient Instructions (Addendum)
[name] , Thank you for taking time to come for your Medicare Wellness Visit. I appreciate your ongoing commitment to your health goals. Please review the following plan we discussed and let me know if I can assist you in the future.   Shingrix is a vaccine for the prevention of Shingles in Adults 50 and older.  If you are on Medicare, you can request a prescription from your doctor to be filled at a pharmacy.  Please check with your benefits regarding applicable copays or out of pocket expenses.  The Shingrix is given in 2 vaccines approx 8 weeks apart. You must receive the 2nd dose prior to 6 months from receipt of the first.   Will try to complete AD; Given copy  Referred to [name] for questions [name] offers free advance directive forms, as well as assistance in completing the forms themselves. For assistance, contact the Spiritual Care Department at [phone], or the Clinical Social Work Department at [phone].    These are the goals we discussed: Goals    . Exercise 150 minutes per week (moderate activity)          Get the punching bag back out; start slow        This is a list of the screening recommended for you and due dates:  Health Maintenance  Topic Date Due  . Flu Shot  06/15/2017  . Tetanus Vaccine  05/13/2018  . Pneumonia vaccines  Completed    DASH Eating Plan DASH stands for "Dietary Approaches to Stop Hypertension." The DASH eating plan is a healthy eating plan that has been shown to reduce high blood pressure (hypertension). It may also reduce your risk for type 2 diabetes, heart disease, and stroke. The DASH eating plan may also help with weight loss. What are tips for following this plan? General guidelines   Avoid eating more than 2,300 mg (milligrams) of salt (sodium) a day. If you have hypertension, you may need to reduce your sodium intake to 1,500 mg a day.  Limit alcohol intake to no more than 1 drink a day for nonpregnant women and 2  drinks a day for men. One drink equals 12 oz of beer, 5 oz of wine, or 1 oz of hard liquor.  Work with your health care provider to maintain a healthy body weight or to lose weight. Ask what an ideal weight is for you.  Get at least 30 minutes of exercise that causes your heart to beat faster (aerobic exercise) most days of the week. Activities may include walking, swimming, or biking.  Work with your health care provider or diet and nutrition specialist (dietitian) to adjust your eating plan to your individual calorie needs. Reading food labels   Check food labels for the amount of sodium per serving. Choose foods with less than 5 percent of the Daily Value of sodium. Generally, foods with less than 300 mg of sodium per serving fit into this eating plan.  To find whole grains, look for the word "whole" as the first word in the ingredient list. Shopping   Buy products labeled as "low-sodium" or "no salt added."  Buy fresh foods. Avoid canned foods and premade or frozen meals. Cooking   Avoid adding salt when cooking. Use salt-free seasonings or herbs instead of table salt or sea salt. Check with your health care provider or pharmacist before using salt substitutes.  Do not fry foods. Cook foods using healthy methods such as baking, boiling, grilling, and broiling  instead.  Cook with heart-healthy oils, such as olive, canola, soybean, or sunflower oil. Meal planning    Eat a balanced diet that includes:  5 or more servings of fruits and vegetables each day. At each meal, try to fill half of your plate with fruits and vegetables.  Up to 6-8 servings of whole grains each day.  Less than 6 oz of lean meat, poultry, or fish each day. A 3-oz serving of meat is about the same size as a deck of cards. One egg equals 1 oz.  2 servings of low-fat dairy each day.  A serving of nuts, seeds, or beans 5 times each week.  Heart-healthy fats. Healthy fats called Omega-3 fatty acids are found  in foods such as flaxseeds and coldwater fish, like sardines, salmon, and mackerel.  Limit how much you eat of the following:  Canned or prepackaged foods.  Food that is high in trans fat, such as fried foods.  Food that is high in saturated fat, such as fatty meat.  Sweets, desserts, sugary drinks, and other foods with added sugar.  Full-fat dairy products.  Do not salt foods before eating.  Try to eat at least 2 vegetarian meals each week.  Eat more home-cooked food and less restaurant, buffet, and fast food.  When eating at a restaurant, ask that your food be prepared with less salt or no salt, if possible. What foods are recommended? The items listed may not be a complete list. Talk with your dietitian about what dietary choices are best for you. Grains  Whole-grain or whole-wheat bread. Whole-grain or whole-wheat pasta. Brown rice. Orpah Cobb. Bulgur. Whole-grain and low-sodium cereals. Pita bread. Low-fat, low-sodium crackers. Whole-wheat flour tortillas. Vegetables  Fresh or frozen vegetables (raw, steamed, roasted, or grilled). Low-sodium or reduced-sodium tomato and vegetable juice. Low-sodium or reduced-sodium tomato sauce and tomato paste. Low-sodium or reduced-sodium canned vegetables. Fruits  All fresh, dried, or frozen fruit. Canned fruit in natural juice (without added sugar). Meat and other protein foods  Skinless chicken or [place]. Ground chicken or [place]. Pork with fat trimmed off. Fish and seafood. Egg whites. Dried beans, peas, or lentils. Unsalted nuts, nut butters, and seeds. Unsalted canned beans. Lean cuts of beef with fat trimmed off. Low-sodium, lean deli meat. Dairy  Low-fat (1%) or fat-free (skim) milk. Fat-free, low-fat, or reduced-fat cheeses. Nonfat, low-sodium ricotta or cottage cheese. Low-fat or nonfat yogurt. Low-fat, low-sodium cheese. Fats and oils  Soft margarine without trans fats. Vegetable oil. Low-fat, reduced-fat, or light mayonnaise  and salad dressings (reduced-sodium). Canola, safflower, olive, soybean, and sunflower oils. Avocado. Seasoning and other foods  Herbs. Spices. Seasoning mixes without salt. Unsalted popcorn and pretzels. Fat-free sweets. What foods are not recommended? The items listed may not be a complete list. Talk with your dietitian about what dietary choices are best for you. Grains  Baked goods made with fat, such as croissants, muffins, or some breads. Dry pasta or rice meal packs. Vegetables  Creamed or fried vegetables. Vegetables in a cheese sauce. Regular canned vegetables (not low-sodium or reduced-sodium). Regular canned tomato sauce and paste (not low-sodium or reduced-sodium). Regular tomato and vegetable juice (not low-sodium or reduced-sodium). Rosita Fire. Olives. Fruits  Canned fruit in a light or heavy syrup. Fried fruit. Fruit in cream or butter sauce. Meat and other protein foods  Fatty cuts of meat. Ribs. Fried meat. Tomasa Blase. Sausage. Bologna and other processed lunch meats. Salami. Fatback. Hotdogs. Bratwurst. Salted nuts and seeds. Canned beans with added salt. Canned  or smoked fish. Whole eggs or egg yolks. Chicken or [place] with skin. Dairy  Whole or 2% milk, cream, and half-and-half. Whole or full-fat cream cheese. Whole-fat or sweetened yogurt. Full-fat cheese. Nondairy creamers. Whipped toppings. Processed cheese and cheese spreads. Fats and oils  Butter. Stick margarine. Lard. Shortening. Ghee. Bacon fat. Tropical oils, such as coconut, palm kernel, or palm oil. Seasoning and other foods  Salted popcorn and pretzels. Onion salt, garlic salt, seasoned salt, table salt, and sea salt. Worcestershire sauce. Tartar sauce. Barbecue sauce. Teriyaki sauce. Soy sauce, including reduced-sodium. Steak sauce. Canned and packaged gravies. Fish sauce. Oyster sauce. Cocktail sauce. Horseradish that you find on the shelf. Ketchup. Mustard. Meat flavorings and tenderizers. Bouillon cubes. Hot sauce and  Tabasco sauce. Premade or packaged marinades. Premade or packaged taco seasonings. Relishes. Regular salad dressings. Where to find more information:  National Heart, Lung, and Blood Institute: PopSteam.iswww.nhlbi.nih.gov  American Heart Association: www.heart.org Summary  The DASH eating plan is a healthy eating plan that has been shown to reduce high blood pressure (hypertension). It may also reduce your risk for type 2 diabetes, heart disease, and stroke.  With the DASH eating plan, you should limit salt (sodium) intake to 2,300 mg a day. If you have hypertension, you may need to reduce your sodium intake to 1,500 mg a day.  When on the DASH eating plan, aim to eat more fresh fruits and vegetables, whole grains, lean proteins, low-fat dairy, and heart-healthy fats.  Work with your health care provider or diet and nutrition specialist (dietitian) to adjust your eating plan to your individual calorie needs. This information is not intended to replace advice given to you by your health care provider. Make sure you discuss any questions you have with your health care provider. Document Released: 10/21/2011 Document Revised: 10/25/2016 Document Reviewed: 10/25/2016 Elsevier Interactive Patient Education  2017 [employer]    Fall Prevention in the Home Falls can cause injuries. They can happen to people of all ages. There are many things you can do to make your home safe and to help prevent falls. What can I do on the outside of my home?  Regularly fix the edges of walkways and driveways and fix any cracks.  Remove anything that might make you trip as you walk through a door, such as a raised step or threshold.  Trim any bushes or trees on the path to your home.  Use bright outdoor lighting.  Clear any walking paths of anything that might make someone trip, such as rocks or tools.  Regularly check to see if handrails are loose or broken. Make sure that both sides of any steps have  handrails.  Any raised decks and porches should have guardrails on the edges.  Have any leaves, snow, or ice cleared regularly.  Use sand or salt on walking paths during winter.  Clean up any spills in your garage right away. This includes oil or grease spills. What can I do in the bathroom?  Use night lights.  Install grab bars by the toilet and in the tub and shower. Do not use towel bars as grab bars.  Use non-skid mats or decals in the tub or shower.  If you need to sit down in the shower, use a plastic, non-slip stool.  Keep the floor dry. Clean up any water that spills on the floor as soon as it happens.  Remove soap buildup in the tub or shower regularly.  Attach bath mats securely with double-sided  non-slip rug tape.  Do not have throw rugs and other things on the floor that can make you trip. What can I do in the bedroom?  Use night lights.  Make sure that you have a light by your bed that is easy to reach.  Do not use any sheets or blankets that are too big for your bed. They should not hang down onto the floor.  Have a firm chair that has side arms. You can use this for support while you get dressed.  Do not have throw rugs and other things on the floor that can make you trip. What can I do in the kitchen?  Clean up any spills right away.  Avoid walking on wet floors.  Keep items that you use a lot in easy-to-reach places.  If you need to reach something above you, use a strong step stool that has a grab bar.  Keep electrical cords out of the way.  Do not use floor polish or wax that makes floors slippery. If you must use wax, use non-skid floor wax.  Do not have throw rugs and other things on the floor that can make you trip. What can I do with my stairs?  Do not leave any items on the stairs.  Make sure that there are handrails on both sides of the stairs and use them. Fix handrails that are broken or loose. Make sure that handrails are as long as  the stairways.  Check any carpeting to make sure that it is firmly attached to the stairs. Fix any carpet that is loose or worn.  Avoid having throw rugs at the top or bottom of the stairs. If you do have throw rugs, attach them to the floor with carpet tape.  Make sure that you have a light switch at the top of the stairs and the bottom of the stairs. If you do not have them, ask someone to add them for you. What else can I do to help prevent falls?  Wear shoes that:  Do not have high heels.  Have rubber bottoms.  Are comfortable and fit you well.  Are closed at the toe. Do not wear sandals.  If you use a stepladder:  Make sure that it is fully opened. Do not climb a closed stepladder.  Make sure that both sides of the stepladder are locked into place.  Ask someone to hold it for you, if possible.  Clearly mark and make sure that you can see:  Any grab bars or handrails.  First and last steps.  Where the edge of each step is.  Use tools that help you move around (mobility aids) if they are needed. These include:  Canes.  Walkers.  Scooters.  Crutches.  Turn on the lights when you go into a dark area. Replace any light bulbs as soon as they burn out.  Set up your furniture so you have a clear path. Avoid moving your furniture around.  If any of your floors are uneven, fix them.  If there are any pets around you, be aware of where they are.  Review your medicines with your doctor. Some medicines can make you feel dizzy. This can increase your chance of falling. Ask your doctor what other things that you can do to help prevent falls. This information is not intended to replace advice given to you by your health care provider. Make sure you discuss any questions you have with your health care provider. Document Released:  08/28/2009 Document Revised: 04/08/2016 Document Reviewed: 12/06/2014 Elsevier Interactive Patient Education  2017 [employer].   Health  Maintenance, Male A healthy lifestyle and preventive care is important for your health and wellness. Ask your health care provider about what schedule of regular examinations is right for you. What should I know about weight and diet?  Eat a Healthy Diet  Eat plenty of vegetables, fruits, whole grains, low-fat dairy products, and lean protein.  Do not eat a lot of foods high in solid fats, added sugars, or salt. Maintain a Healthy Weight  Regular exercise can help you achieve or maintain a healthy weight. You should:  Do at least 150 minutes of exercise each week. The exercise should increase your heart rate and make you sweat (moderate-intensity exercise).  Do strength-training exercises at least twice a week. Watch Your Levels of Cholesterol and Blood Lipids  Have your blood tested for lipids and cholesterol every 5 years starting at 80 years of age. If you are at high risk for heart disease, you should start having your blood tested when you are 80 years old. You may need to have your cholesterol levels checked more often if:  Your lipid or cholesterol levels are high.  You are older than 80 years of age.  You are at high risk for heart disease. What should I know about cancer screening? Many types of cancers can be detected early and may often be prevented. Lung Cancer  You should be screened every year for lung cancer if:  You are a current smoker who has smoked for at least 30 years.  You are a former smoker who has quit within the past 15 years.  Talk to your health care provider about your screening options, when you should start screening, and how often you should be screened. Colorectal Cancer  Routine colorectal cancer screening usually begins at 80 years of age and should be repeated every 5-10 years until you are 80 years old. You may need to be screened more often if early forms of precancerous polyps or small growths are found. Your health care provider may  recommend screening at an earlier age if you have risk factors for colon cancer.  Your health care provider may recommend using home test kits to check for hidden blood in the stool.  A small camera at the end of a tube can be used to examine your colon (sigmoidoscopy or colonoscopy). This checks for the earliest forms of colorectal cancer. Prostate and Testicular Cancer  Depending on your age and overall health, your health care provider may do certain tests to screen for prostate and testicular cancer.  Talk to your health care provider about any symptoms or concerns you have about testicular or prostate cancer. Skin Cancer  Check your skin from head to toe regularly.  Tell your health care provider about any new moles or changes in moles, especially if:  There is a change in a mole's size, shape, or color.  You have a mole that is larger than a pencil eraser.  Always use sunscreen. Apply sunscreen liberally and repeat throughout the day.  Protect yourself by wearing long sleeves, pants, a wide-brimmed hat, and sunglasses when outside. What should I know about heart disease, diabetes, and high blood pressure?  If you are 48-58 years of age, have your blood pressure checked every 3-5 years. If you are 41 years of age or older, have your blood pressure checked every year. You should have your blood pressure  measured twice-once when you are at a hospital or clinic, and once when you are not at a hospital or clinic. Record the average of the two measurements. To check your blood pressure when you are not at a hospital or clinic, you can use:  An automated blood pressure machine at a pharmacy.  A home blood pressure monitor.  Talk to your health care provider about your target blood pressure.  If you are between 77-31 years old, ask your health care provider if you should take aspirin to prevent heart disease.  Have regular diabetes screenings by checking your fasting blood sugar  level.  If you are at a normal weight and have a low risk for diabetes, have this test once every three years after the age of 86.  If you are overweight and have a high risk for diabetes, consider being tested at a younger age or more often.  A one-time screening for abdominal aortic aneurysm (AAA) by ultrasound is recommended for men aged 65-75 years who are current or former smokers. What should I know about preventing infection? Hepatitis B  If you have a higher risk for hepatitis B, you should be screened for this virus. Talk with your health care provider to find out if you are at risk for hepatitis B infection. Hepatitis C  Blood testing is recommended for:  Everyone born from [birth year] through 1963.  Anyone with known risk factors for hepatitis C. Sexually Transmitted Diseases (STDs)  You should be screened each year for STDs including gonorrhea and chlamydia if:  You are sexually active and are younger than 80 years of age.  You are older than 80 years of age and your health care provider tells you that you are at risk for this type of infection.  Your sexual activity has changed since you were last screened and you are at an increased risk for chlamydia or gonorrhea. Ask your health care provider if you are at risk.  Talk with your health care provider about whether you are at high risk of being infected with HIV. Your health care provider may recommend a prescription medicine to help prevent HIV infection. What else can I do?  Schedule regular health, dental, and eye exams.  Stay current with your vaccines (immunizations).  Do not use any tobacco products, such as cigarettes, chewing tobacco, and e-cigarettes. If you need help quitting, ask your health care provider.  Limit alcohol intake to no more than 2 drinks per day. One drink equals 12 ounces of beer, 5 ounces of wine, or 1 ounces of hard liquor.  Do not use street drugs.  Do not share needles.  Ask your health  care provider for help if you need support or information about quitting drugs.  Tell your health care provider if you often feel depressed.  Tell your health care provider if you have ever been abused or do not feel safe at home. This information is not intended to replace advice given to you by your health care provider. Make sure you discuss any questions you have with your health care provider. Document Released: 04/29/2008 Document Revised: 06/30/2016 Document Reviewed: 08/05/2015 Elsevier Interactive Patient Education  2017 [employer].

## 2017-04-06 NOTE — Progress Notes (Addendum)
Subjective:   [name] is a 82 y.o. male who presents for Medicare Annual/Subsequent preventive examination.  Cardiac Risk Factors include: advanced age (>1men, >58 women);hypertension;male gender   The Patient was informed that the wellness visit is to identify future health risk and educate and initiate measures that can reduce risk for increased disease through the lifespan.    NO ROS; Medicare Wellness Visit  Describes health as good, fair or great? Good  Last OV /11/2016  BP running 135/ 60-70 at home Checks it 2 times a week on average;  Given information on DASH diet and to monitor sodium   Psychosocial Married x 38 Has 2 children One is in the area; one passed away from addiction  Preventive Screening -Counseling & Management  Multiple Pulmonary nodules: Per CT stable MG states this was a long time ago and no issues at present Started with drooping eye  PSA 3.29 2018 Colonoscopy 04/2010- aged out     Smoking history- never smoked  Second Hand Smoke status; No Smokers in the home  Only years ago  ETOH - no  Medication adherence or issues? No issues  RISK FACTORS Diet Cooks at home Breakfast; doesn't eat a lot; coffee; milk  Lunch; sandwich; salad  Supper; meat and vegetables Vegetables x 2 per day Drinks V8 sometimes BMI wnl  Regular exercise  Walk his dog; 30 to 40 minutes every day Works in the yard;  Agreed to start doing some weights;    Cardiac Risk Factors:  Advanced aged > 55 in men;  Hyperlipidemia - chol 107; HDL 46; LDL 126; Trig 75 Diabetes -neg Family History - HD, HTN both parents Obesity -neg  Fall risk no Given education on "Fall Prevention in the Home" for more safety tips the patient can apply as appropriate.  Long term goal is to "age in place" or undecided   Mobility of Functional changes this year? No  Safety; community safe;  2 level home; talked about moving  wears sunscreen; does wear a hat   safe place  for firearms;  Motor vehicle accidents; no  Mental Health:  Any emotional problems? Anxious, depressed, irritable, sad or blue? no Denies feeling depressed or hopeless; voices pleasure in daily life How many social activities have you been engaged in within the last 2 weeks? no  Hearing Screening Comments: No hearing issues  Vision Screening Comments: Vision checks;  Vision is very good Dr. Darel Hong q 1 to 2 year unless he has issues  Now sees a new doctor   Activities of Daily Living - See functional screen   Cognitive testing; Ad8 score; 0 or less than 2  MMSE deferred or completed if AD8 + 2 issues   Advanced Directive; Reviewed advanced directive and agreed to receipt of information and discussion.  Focused face to face x  20 minutes discussing HCPOA and Living will and reviewed all the questions in the [facility] forms. The patient voices understanding of HCPOA; LW reviewed and information provided on each question. Educated on how to revoke this HCPOA or LW at any time.   Also  discussed life prolonging measures (given a few examples) and where he could choose to initiate or not;  the ability to given the HCPOA power to change his living will or not if he cannot speak for himself; as well as finalizing the will by 2 unrelated witnesses and notary.  Will call for questions and given information on [facility] pastoral department for further  assistance.   Will bring a copy for the chart when completed  Patient Care Team: Roderick Pee, MD as PCP - General   Immunization History  Administered Date(s) Administered  . Influenza Whole 10/14/2004, 08/18/2007, 08/28/2008, 09/08/2009, 08/20/2010  . Influenza, High Dose Seasonal PF 08/27/2013, 09/10/2015, 12/06/2016  . Influenza,inj,Quad PF,36+ Mos 08/29/2014  . Pneumococcal Conjugate-13 08/29/2014  . Pneumococcal Polysaccharide-23 02/18/2005  . Td 11/15/1998, 05/13/2008  . Zoster 06/03/2009   Required Immunizations needed  today  Screening test up to date or reviewed for plan of completion There are no preventive care reminders to display for this patient.      Objective:    Vitals: BP 140/80   Pulse 78   Ht 5' 5.5" (1.664 m)   Wt 155 lb (70.3 kg)   SpO2 93%   BMI 25.40 kg/m   Body mass index is 25.4 kg/m. BP checked at home and running 136/70   Tobacco History  Smoking Status  . Never Smoker  Smokeless Tobacco  . Never Used     Counseling given: Yes   Past Medical History:  Diagnosis Date  . Arthritis   . Asthma   . BPH (benign prostatic hypertrophy)   . GERD (gastroesophageal reflux disease)   . History of kidney stones   . History of myasthenia gravis    15 yrs ago - no current problem or treatment  . Hypertension   . Mild carotid artery disease (HCC)    bilateral ICA 0-39%  per duplex 02-16-2013  . Multiple pulmonary nodules    per last CT stable  . Nephrolithiasis    BILATERAL   Past Surgical History:  Procedure Laterality Date  . CATARACT EXTRACTION W/ INTRAOCULAR LENS  IMPLANT, BILATERAL  2007  . COLONOSCOPY W/ POLYPECTOMY  05-14-2010  . CYSTO/  LEFT RETROGRADE PYELOGRAM/  LEFT URETEROSCOPY/  LEFT STENT PLACEMENT  06-17-2008  . CYSTOSCOPY W/ RETROGRADES Bilateral 03/24/2015   Procedure: CYSTOSCOPY WITH RETROGRADE PYELOGRAM;  Surgeon: Ihor Gully, MD;  [facility];  Service: Urology;  Laterality: Bilateral;  . CYSTOSCOPY WITH STENT PLACEMENT Right 03/24/2015   Procedure: CYSTOSCOPY WITH STENT PLACEMENT;  Surgeon: Ihor Gully, MD;  Location[facility];  Service: Urology;  Laterality: Right;  . CYSTOSCOPY WITH URETEROSCOPY Right 03/24/2015   Procedure: CYSTOSCOPY WITH URETEROSCOPY;  Surgeon: Ihor Gully, MD;  Location: [facility];  Service: Urology;  Laterality: Right;  . EXTRACORPOREAL SHOCK WAVE LITHOTRIPSY Bilateral left 02-23-2010 //   right 09-28-2010  . HOLMIUM LASER APPLICATION Right 03/24/2015   Procedure:  HOLMIUM LASER APPLICATION;  Surgeon: Ihor Gully, MD;  [facility];  Service: Urology;  Laterality: Right;  . INGUINAL HERNIA REPAIR Bilateral 2002 approx.  Marland Kitchen NEPHROLITHOTOMY Left 05/30/2015   Procedure: LEFT PERCUTANEOUS NEPHROLITHOTOMY;  Surgeon: Ihor Gully, MD;  Location: [facility];  Service: Urology;  Laterality: Left;  . STONE EXTRACTION WITH BASKET Right 03/24/2015   Procedure: STONE EXTRACTION WITH BASKET;  Surgeon: Ihor Gully, MD;  Location: [facility];  Service: Urology;  Laterality: Right;   Family History  Problem Relation Age of Onset  . Heart disease Mother   . Hypertension Mother   . Kidney disease Mother   . Heart disease Father   . Hypertension Father   . Hypercalcemia Neg Hx    History  Sexual Activity  . Sexual activity: Not on file    Outpatient Encounter Prescriptions as of 04/06/2017  Medication Sig  . aspirin  81 MG EC tablet Take 81 mg by mouth every morning.   . calcium carbonate (TUMS - DOSED IN MG ELEMENTAL CALCIUM) 500 MG chewable tablet Chew 1 tablet by mouth as needed for indigestion or heartburn. Reported on 11/19/2015  . fluticasone (FLOVENT HFA) 44 MCG/ACT inhaler Inhale 1 puff into the lungs 2 (two) times daily.  Marland Kitchen ibuprofen (ADVIL,MOTRIN) 200 MG tablet Take 200 mg by mouth every 6 (six) hours as needed for fever or moderate pain.   Marland Kitchen lisinopril-hydrochlorothiazide (PRINZIDE,ZESTORETIC) 10-12.5 MG tablet Take 1 tablet by mouth daily.  . montelukast (SINGULAIR) 10 MG tablet Take 1 tablet (10 mg total) by mouth at bedtime.  . montelukast (SINGULAIR) 10 MG tablet TAKE 1 BY MOUTH AT BEDTIME  . Multiple Vitamin (MULTIVITAMIN) tablet Take 1 tablet by mouth every morning. Reported on 11/19/2015  . Omega-3 Fatty Acids (FISH OIL) 1200 MG CAPS Take 1 capsule by mouth every morning.   . polyvinyl alcohol (LIQUIFILM TEARS) 1.4 % ophthalmic solution Place 1 drop into both eyes daily as needed for dry eyes. Reported on 11/19/2015    No facility-administered encounter medications on file as of 04/06/2017.     Activities of Daily Living In your present state of health, do you have any difficulty performing the following activities: 04/06/2017  Hearing? N  Vision? N  Difficulty concentrating or making decisions? N  Walking or climbing stairs? N  Dressing or bathing? N  Doing errands, shopping? N  Preparing Food and eating ? N  Using the Toilet? N  In the past six months, have you accidently leaked urine? N  Do you have problems with loss of bowel control? N  Managing your Medications? N  Managing your Finances? N  Housekeeping or managing your Housekeeping? N  Some recent data might be hidden    Patient Care Team: Roderick Pee, MD as PCP - General   Assessment:     Exercise Activities and Dietary recommendations Current Exercise Habits: Home exercise routine, Type of exercise: strength training/weights;walking, Time (Minutes): 35, Frequency (Times/Week): 6 (will add some boxing; occasionally will run), Weekly Exercise (Minutes/Week): 210, Intensity: Moderate  Goals    . Exercise 150 minutes per week (moderate activity)          Get the punching bag back out; start slow       Fall Risk Fall Risk  04/06/2017 12/06/2016 09/10/2015 08/29/2014 08/27/2013  Falls in the past year? No No No No No   Depression Screen PHQ 2/9 Scores 04/06/2017 12/06/2016 09/10/2015 08/29/2014  PHQ - 2 Score 0 0 0 0    Cognitive Function MMSE - Mini Mental State Exam 04/06/2017  Not completed: (No Data)        Immunization History  Administered Date(s) Administered  . Influenza Whole 10/14/2004, 08/18/2007, 08/28/2008, 09/08/2009, 08/20/2010  . Influenza, High Dose Seasonal PF 08/27/2013, 09/10/2015, 12/06/2016  . Influenza,inj,Quad PF,36+ Mos 08/29/2014  . Pneumococcal Conjugate-13 08/29/2014  . Pneumococcal Polysaccharide-23 02/18/2005  . Td 11/15/1998, 05/13/2008  . Zoster 06/03/2009   Screening Tests Health  Maintenance  Topic Date Due  . INFLUENZA VACCINE  06/15/2017  . TETANUS/TDAP  05/13/2018  . PNA vac Low Risk Adult  Completed      Plan:    PCP Notes  Health Maintenance No over due screens Educated regarding the shingrix vaccine now available at the pharmacy;   Agreed to completed HCPOA and LW; Given [place] form and reviewed for questions  Abnormal Screens none Referrals none  Patient concerns;  Will start exercising with his " punching bag"  Start low and slow   Nurse Concerns; none Will monitor sodium and discussed drinking water and checking how much sodium he is getting; V8 juice etc.; given the DASH plan    Next PCP apt next jan 2019 TBS     I have personally reviewed and noted the following in the patient's chart:   . Medical and social history . Use of alcohol, tobacco or illicit drugs  . Current medications and supplements . Functional ability and status . Nutritional status . Physical activity . Advanced directives . List of other physicians . Hospitalizations, surgeries, and ER visits in previous 12 months . Vitals . Screenings to include cognitive, depression, and falls . Referrals and appointments  In addition, I have reviewed and discussed with patient certain preventive protocols, quality metrics, and best practice recommendations. A written personalized care plan for preventive services as well as general preventive health recommendations were provided to patient.     Aften Lipsey, RN  04/06/2017  I have reviewed this note and agree with its contents.  Gershon CraneStephen Fry, MD

## 2017-06-12 IMAGING — NM NM PARATHYROID W/ SPECT
3 series · 18 of 18 positions shown · non-contrast
Comparison: None.

CLINICAL DATA: Evaluate for parathyroid adenoma

EXAM:
NM PARATHYROID SCINTIGRAPHY AND SPECT IMAGING
TECHNIQUE: Following intravenous administration of radiopharmaceutical, early
and 2-hour delayed planar images were obtained in the anterior
projection. Delayed triplanar SPECT images were also obtained at 2
hours.
RADIOPHARMACEUTICALS:  25.8 mCi 1c-HHm Sestamibi IV

[Series 1: tra1_tra · 4.1mm · 4.14mm/px · 6 of 128 frames shown]
[frame 11/128]
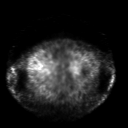
[frame 32/128]
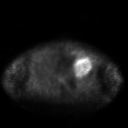
[frame 54/128]
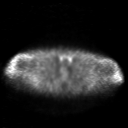
[frame 75/128]
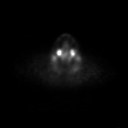
[frame 96/128]
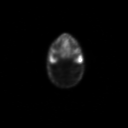
[frame 118/128]
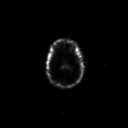

[Series 1: cor1_cor · 4.1mm · 4.14mm/px · 6 of 128 frames shown]
[frame 11/128]
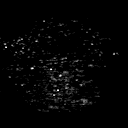
[frame 32/128]
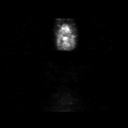
[frame 54/128]
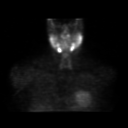
[frame 75/128]
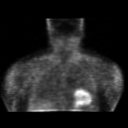
[frame 96/128]
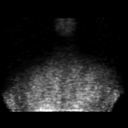
[frame 118/128]
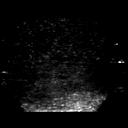

[Series 3: spect parathyroid · 4.14mm/px · 6 of 64 frames shown]
[frame 6/64]
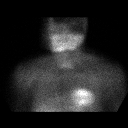
[frame 16/64]
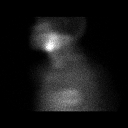
[frame 27/64]
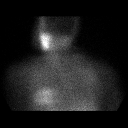
[frame 38/64]
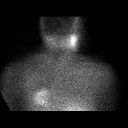
[frame 48/64]
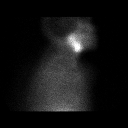
[frame 59/64]
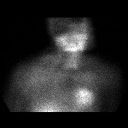

[18 of 18 positions shown; findings below may reference images not displayed]

FINDINGS: On the early images there is uniform activity within both lobes of
the thyroid gland. On the delayed images there is partial washout of
the radiopharmaceutical from both lobes of the thyroid gland. No
dominant focus of increased radiotracer uptake is identified to
suggest location of suspected parathyroid adenoma.
IMPRESSION: 1. Examination is negative for parathyroid adenoma.

## 2017-08-05 ENCOUNTER — Encounter: Payer: Self-pay | Admitting: Family Medicine

## 2017-08-25 ENCOUNTER — Ambulatory Visit (INDEPENDENT_AMBULATORY_CARE_PROVIDER_SITE_OTHER): Payer: Medicare Other

## 2017-08-25 DIAGNOSIS — Z23 Encounter for immunization: Secondary | ICD-10-CM | POA: Diagnosis not present

## 2017-09-13 ENCOUNTER — Telehealth: Payer: Self-pay | Admitting: Family Medicine

## 2017-09-13 ENCOUNTER — Encounter: Payer: Self-pay | Admitting: Adult Health

## 2017-09-13 ENCOUNTER — Ambulatory Visit (INDEPENDENT_AMBULATORY_CARE_PROVIDER_SITE_OTHER): Payer: Medicare Other | Admitting: Adult Health

## 2017-09-13 VITALS — BP 136/86 | Temp 98.3°F | Ht 65.5 in | Wt 153.0 lb

## 2017-09-13 DIAGNOSIS — K625 Hemorrhage of anus and rectum: Secondary | ICD-10-CM | POA: Diagnosis not present

## 2017-09-13 DIAGNOSIS — Z1211 Encounter for screening for malignant neoplasm of colon: Secondary | ICD-10-CM | POA: Diagnosis not present

## 2017-09-13 MED ORDER — HYDROCORTISONE ACETATE 25 MG RE SUPP: 25.0000 mg | Freq: Two times a day (BID) | RECTAL | 0 refills | Status: DC

## 2017-09-13 NOTE — Patient Instructions (Signed)
I have sent in a suppository for you to use over the next 2-3 days   Please drink plenty of fluids and use Metamucil   If you continue to bleed, please follow up immediately

## 2017-09-13 NOTE — Telephone Encounter (Signed)
He  can use OTC hydrocortisone cream and place it in his rectum. I will go ahead and place the colonoscopy, since he is having rectal bleeding

## 2017-09-13 NOTE — Progress Notes (Signed)
Subjective:    Patient ID: [name], male    DOB: Feb 27, 1935, 82 y.o.   MRN: [id number]  HPI  82 year old male who  has a past medical history of Arthritis; Asthma; BPH (benign prostatic hypertrophy); GERD (gastroesophageal reflux disease); History of kidney stones; History of myasthenia gravis; Hypertension; Mild carotid artery disease (HCC); Multiple pulmonary nodules; and Nephrolithiasis. He presents to the office for 2-3 days of rectal bleeding. He reports dark red blood in his stool and " dripping" into the bowl after his BM.   He denies any n/v/d/or constipation. He does have intermittent episodes of left lower quadrant pain - none right now. He has not noticed any fevers.   His last colonoscopy was in 2011 which showed internal hemorrhoids and he had a polyp removed. He was due to return in 5 years   Review of Systems See HPI   Past Medical History:  Diagnosis Date  . Arthritis   . Asthma   . BPH (benign prostatic hypertrophy)   . GERD (gastroesophageal reflux disease)   . History of kidney stones   . History of myasthenia gravis    15 yrs ago - no current problem or treatment  . Hypertension   . Mild carotid artery disease (HCC)    bilateral ICA 0-39%  per duplex 02-16-2013  . Multiple pulmonary nodules    per last CT stable  . Nephrolithiasis    BILATERAL    Social History   Social History  . Marital status: Married    Spouse name: N/A  . Number of children: N/A  . Years of education: N/A   Occupational History  . Not on file.   Social History Main Topics  . Smoking status: Never Smoker  . Smokeless tobacco: Never Used  . Alcohol use No  . Drug use: No  . Sexual activity: Not on file   Other Topics Concern  . Not on file   Social History Narrative  . No narrative on file    Past Surgical History:  Procedure Laterality Date  . CATARACT EXTRACTION W/ INTRAOCULAR LENS  IMPLANT, BILATERAL  2007  . COLONOSCOPY W/ POLYPECTOMY  05-14-2010  .  CYSTO/  LEFT RETROGRADE PYELOGRAM/  LEFT URETEROSCOPY/  LEFT STENT PLACEMENT  06-17-2008  . CYSTOSCOPY W/ RETROGRADES Bilateral 03/24/2015   Procedure: CYSTOSCOPY WITH RETROGRADE PYELOGRAM;  Surgeon: Ihor Gully, MD;  Location: [facility];  Service: Urology;  Laterality: Bilateral;  . CYSTOSCOPY WITH STENT PLACEMENT Right 03/24/2015   Procedure: CYSTOSCOPY WITH STENT PLACEMENT;  Surgeon: Ihor Gully, MD;  Location: [facility];  Service: Urology;  Laterality: Right;  . CYSTOSCOPY WITH URETEROSCOPY Right 03/24/2015   Procedure: CYSTOSCOPY WITH URETEROSCOPY;  Surgeon: Ihor Gully, MD;  Location: [facility];  Service: Urology;  Laterality: Right;  . EXTRACORPOREAL SHOCK WAVE LITHOTRIPSY Bilateral left 02-23-2010 //   right 09-28-2010  . HOLMIUM LASER APPLICATION Right 03/24/2015   Procedure: HOLMIUM LASER APPLICATION;  Surgeon: Ihor Gully, MD;  Location: [facility];  Service: Urology;  Laterality: Right;  . INGUINAL HERNIA REPAIR Bilateral 2002 approx.  Marland Kitchen NEPHROLITHOTOMY Left 05/30/2015   Procedure: LEFT PERCUTANEOUS NEPHROLITHOTOMY;  Surgeon: Ihor Gully, MD;  Location: [facility];  Service: Urology;  Laterality: Left;  . STONE EXTRACTION WITH BASKET Right 03/24/2015   Procedure: STONE EXTRACTION WITH BASKET;  Surgeon: Ihor Gully, MD;  Location: [facility];  Service: Urology;  Laterality: Right;    Family History  Problem Relation Age of Onset  . Heart disease Mother   . Hypertension Mother   . Kidney disease Mother   . Heart disease Father   . Hypertension Father   . Hypercalcemia Neg Hx     No Known Allergies  Current Outpatient Prescriptions on File Prior to Visit  Medication Sig Dispense Refill  . aspirin 81 MG EC tablet Take 81 mg by mouth every morning.     . calcium carbonate (TUMS - DOSED IN MG ELEMENTAL CALCIUM) 500 MG chewable tablet Chew 1 tablet by mouth as needed for indigestion or heartburn. Reported  on 11/19/2015    . fluticasone (FLOVENT HFA) 44 MCG/ACT inhaler Inhale 1 puff into the lungs 2 (two) times daily. 3 Inhaler 2  . ibuprofen (ADVIL,MOTRIN) 200 MG tablet Take 200 mg by mouth every 6 (six) hours as needed for fever or moderate pain.     Marland Kitchen. lisinopril-hydrochlorothiazide (PRINZIDE,ZESTORETIC) 10-12.5 MG tablet Take 1 tablet by mouth daily. 90 tablet 4  . montelukast (SINGULAIR) 10 MG tablet TAKE 1 BY MOUTH AT BEDTIME 90 tablet 4  . Multiple Vitamin (MULTIVITAMIN) tablet Take 1 tablet by mouth every morning. Reported on 11/19/2015    . Omega-3 Fatty Acids (FISH OIL) 1200 MG CAPS Take 1 capsule by mouth every morning.     . polyvinyl alcohol (LIQUIFILM TEARS) 1.4 % ophthalmic solution Place 1 drop into both eyes daily as needed for dry eyes. Reported on 11/19/2015     No current facility-administered medications on file prior to visit.     BP 136/86 (BP Location: Right Arm)   Temp 98.3 F (36.8 C) (Oral)   Ht 5' 5.5" (1.664 m)   Wt 153 lb (69.4 kg)   BMI 25.07 kg/m       Objective:   Physical Exam  Constitutional: He is oriented to person, place, and time. He appears well-developed and well-nourished. He appears distressed.  Cardiovascular: Normal rate, regular rhythm, normal heart sounds and intact distal pulses.  Exam reveals no gallop.   No murmur heard. Pulmonary/Chest: Effort normal and breath sounds normal. No respiratory distress. He has no wheezes. He has no rales.  Abdominal: Soft. Bowel sounds are normal. He exhibits no distension and no mass. There is no tenderness. There is no rebound and no guarding.  Genitourinary: Rectal exam shows internal hemorrhoid and guaiac positive stool (bright red blood ). Rectal exam shows no external hemorrhoid, no fissure, no mass, no tenderness and anal tone normal.  Neurological: He is alert and oriented to person, place, and time.  Skin: Skin is warm and dry. No rash noted. He is not diaphoretic. No erythema. No pallor.  Psychiatric:  He has a normal mood and affect. His behavior is normal. Judgment and thought content normal.  Nursing note and vitals reviewed.     Assessment & Plan:   1. Rectal bleeding  - hydrocortisone (ANUSOL-HC) 25 MG suppository; Place 1 suppository (25 mg total) rectally 2 (two) times daily.  Dispense: 12 suppository; Refill: 0 - CBC with Differential/Platelet - Ambulatory referral to Gastroenterology - Follow up in 2-3 days if bleeding has not resolved  2. Colon cancer screening  - Ambulatory referral to Gastroenterology   Shirline Freesory Jayleah Garbers, NP

## 2017-09-13 NOTE — Telephone Encounter (Signed)
Called the pt to ask about colonoscopy.  Pt was not home.  Wife did inform me that insurance does not cover hydrocortisone (ANUSOL-HC) 25 MG suppository.  Is there a different medication that can be sent in?

## 2017-09-14 ENCOUNTER — Encounter: Payer: Self-pay | Admitting: Physician Assistant

## 2017-09-14 LAB — CBC WITH DIFFERENTIAL/PLATELET
Basophils Absolute: 0.1 10*3/uL (ref 0.0–0.1)
Basophils Relative: 0.6 % (ref 0.0–3.0)
EOS ABS: 0.1 10*3/uL (ref 0.0–0.7)
EOS PCT: 0.7 % (ref 0.0–5.0)
HEMATOCRIT: 44.9 % (ref 39.0–52.0)
HEMOGLOBIN: 15 g/dL (ref 13.0–17.0)
LYMPHS PCT: 21.3 % (ref 12.0–46.0)
Lymphs Abs: 1.7 10*3/uL (ref 0.7–4.0)
MCHC: 33.5 g/dL (ref 30.0–36.0)
MCV: 92.2 fl (ref 78.0–100.0)
MONO ABS: 0.7 10*3/uL (ref 0.1–1.0)
Monocytes Relative: 8.3 % (ref 3.0–12.0)
Neutro Abs: 5.6 10*3/uL (ref 1.4–7.7)
Neutrophils Relative %: 69.1 % (ref 43.0–77.0)
Platelets: 220 10*3/uL (ref 150.0–400.0)
RBC: 4.87 Mil/uL (ref 4.22–5.81)
RDW: 13.8 % (ref 11.5–15.5)
WBC: 8.1 10*3/uL (ref 4.0–10.5)

## 2017-09-14 NOTE — Telephone Encounter (Signed)
Spoke to the pt and advised that he use OTC hydrocortisone cream.  Pt agreed. He also agreed to colonoscopy.  [facility]  No further action needed.  Will close note.

## 2017-09-28 ENCOUNTER — Encounter: Payer: Self-pay | Admitting: Physician Assistant

## 2017-09-28 ENCOUNTER — Ambulatory Visit: Payer: Medicare Other | Admitting: Physician Assistant

## 2017-09-28 VITALS — BP 122/80 | HR 72 | Ht 65.5 in | Wt 156.1 lb

## 2017-09-28 DIAGNOSIS — Z86010 Personal history of colonic polyps: Secondary | ICD-10-CM

## 2017-09-28 DIAGNOSIS — K648 Other hemorrhoids: Secondary | ICD-10-CM | POA: Diagnosis not present

## 2017-09-28 DIAGNOSIS — Z1211 Encounter for screening for malignant neoplasm of colon: Secondary | ICD-10-CM

## 2017-09-28 DIAGNOSIS — K625 Hemorrhage of anus and rectum: Secondary | ICD-10-CM | POA: Diagnosis not present

## 2017-09-28 MED ORDER — NA SULFATE-K SULFATE-MG SULF 17.5-3.13-1.6 GM/177ML PO SOLN: ORAL | 0 refills | Status: DC

## 2017-09-28 NOTE — Patient Instructions (Addendum)
You have been scheduled for a colonoscopy. Please follow written instructions given to you at your visit today.  Please pick up your prep supplies at the pharmacy within the next 1-3 days. If you use inhalers (even only as needed), please bring them with you on the day of your procedure. Your physician has requested that you go to www.startemmi.com and enter the access code given to you at your visit today. This web site gives a general overview about your procedure. However, you should still follow specific instructions given to you by our office regarding your preparation for the procedure.  If you are age 80 or older, your body mass index should be between 23-30. Your Body mass index is 25.59 kg/m. If this is out of the aforementioned range listed, please consider follow up with your Primary Care Provider.

## 2017-09-28 NOTE — Progress Notes (Signed)
Subjective:    Patient ID: [name], male    DOB: 05/05/1935, 82 y.o.   MRN: [id number]  HPI  [name] is a pleasant 82 year old African-American male, known to Dr. Marina GoodellPerry from previous colonoscopy who is referred today by Glenford Bayleyory Naftziger NP, after a recent episode of rectal bleeding. Patient last had colonoscopy in June 2011 for history of adenomatous colon polyps and He was found to have one diminutive polyp in the ascending colon and internal hemorrhoids.. He was recommended to have follow-up in 5 years. He said he had onset on October 30 of a painless bowel movement that was grossly bloody and dark red in color. He had no associated abdominal pain or cramping. He has not had any recent hemorrhoidal symptoms. He had 2 episodes that appeared to be all blood and then a third episode that day which was blood mixed with stool. Symptoms persisted over the next couple of days, gradually seeing more stool in the last blood mixed in. He has not had any bleeding over the past 2 weeks.Marland Kitchen. He says his stools are normal and brown. His appetite is been fine, his weight has been stable, he has had some left lower abdominal pain off and on over the past few months. This has not been constant. He does have history of kidney stones and says this felt more like what a kidney stone would feel like that has continued intermittently. When seen by primary care had a CBC done showing WBC of 8.1 hemoglobin 15 hematocrit of 44.9. Patient takes baby aspirin daily and one ibuprofen daily no blood thinners. He is otherwise in generally good health with history of hypertension and asthma.   Review of Systems Pertinent positive and negative review of systems were noted in the above HPI section.  All other review of systems was otherwise negative.  Outpatient Encounter Medications as of 09/28/2017  Medication Sig  . aspirin 81 MG EC tablet Take 81 mg by mouth every morning.   . calcium carbonate (TUMS - DOSED IN MG  ELEMENTAL CALCIUM) 500 MG chewable tablet Chew 1 tablet by mouth as needed for indigestion or heartburn. Reported on 11/19/2015  . fluticasone (FLOVENT HFA) 44 MCG/ACT inhaler Inhale 1 puff into the lungs 2 (two) times daily.  . hydrocortisone (ANUSOL-HC) 25 MG suppository Place 1 suppository (25 mg total) rectally 2 (two) times daily.  Marland Kitchen. ibuprofen (ADVIL,MOTRIN) 200 MG tablet Take 200 mg by mouth every 6 (six) hours as needed for fever or moderate pain.   Marland Kitchen. lisinopril-hydrochlorothiazide (PRINZIDE,ZESTORETIC) 10-12.5 MG tablet Take 1 tablet by mouth daily.  . montelukast (SINGULAIR) 10 MG tablet TAKE 1 BY MOUTH AT BEDTIME  . Multiple Vitamin (MULTIVITAMIN) tablet Take 1 tablet by mouth every morning. Reported on 11/19/2015  . Omega-3 Fatty Acids (FISH OIL) 1200 MG CAPS Take 1 capsule by mouth every morning.   . polyvinyl alcohol (LIQUIFILM TEARS) 1.4 % ophthalmic solution Place 1 drop into both eyes daily as needed for dry eyes. Reported on 11/19/2015  . Na Sulfate-K Sulfate-Mg Sulf 17.5-3.13-1.6 GM/177ML SOLN Take as directed for colonoscopy prep.  . [DISCONTINUED] Na Sulfate-K Sulfate-Mg Sulf 17.5-3.13-1.6 GM/177ML SOLN Take as directed for colonoscopy prep.   No facility-administered encounter medications on file as of 09/28/2017.    No Known Allergies Patient Active Problem List   Diagnosis Date Noted  . Hypercalcemia 11/19/2015  . Family history of prostate cancer 09/10/2015  . Recurrent kidney stones 03/18/2015  . History of multiple pulmonary nodules 04/10/2014  .  ERECTILE DYSFUNCTION, ORGANIC 05/16/2009  . OSTEOARTHRITIS 11/20/2008  . MIGRAINE, OPHTHALMIC 03/21/2007  . MYASTHENIA GRAVIS W/O (ACUTE) EXACERBATION 03/21/2007  . Essential hypertension 03/21/2007  . Allergic rhinitis 03/21/2007  . Asthma 03/21/2007  . GERD 03/21/2007  . HERNIA 03/21/2007   Social History   Socioeconomic History  . Marital status: Married    Spouse name: Not on file  . Number of children: Not on file   . Years of education: Not on file  . Highest education level: Not on file  Social Needs  . Financial resource strain: Not on file  . Food insecurity - worry: Not on file  . Food insecurity - inability: Not on file  . Transportation needs - medical: Not on file  . Transportation needs - non-medical: Not on file  Occupational History  . Not on file  Tobacco Use  . Smoking status: Never Smoker  . Smokeless tobacco: Never Used  Substance and Sexual Activity  . Alcohol use: No    Alcohol/week: 0.0 oz  . Drug use: No  . Sexual activity: Not on file  Other Topics Concern  . Not on file  Social History Narrative  . Not on file    [name]'s family history includes Heart disease in his father and mother; Hypertension in his father and mother; Kidney disease in his mother.      Objective:    Vitals:   09/28/17 0930  BP: 122/80  Pulse: 72    Physical Exam; well-developed older African-American male in no acute distress blood pressure 122/80, pulse 72, BMI 25.5. HEENT; nontraumatic normocephalic EOMI PERRLA sclera anicteric, Cardiovascular; regular rate and rhythm with S1-S2 no murmur or gallop, Pulmonary; clear bilaterally, Abdomen; soft, nontender nondistended bowel sounds are active there is no palpable mass or hepatosplenomegaly, Rectal; exam not done, Extremities; no clubbing cyanosis or edema skin warm and dry, Neuropsych ;mood and affect appropriate       Assessment & Plan:   #851 82 year old African-American male with history of adenomatous colon polyps and previously documented internal hemorrhoids who is referred after a recent episode of painless rectal bleeding with bright red blood mixed with stool over a 3 to four-day period, no symptoms over the past 2 weeks.  Etiology of bleeding is not clear, this may have been hemorrhoidal, possibly diverticular though no diverticuli previously documented versus occult lesion  #2 asthma #3 hypertension #4 history of ureteral  lithiasis #5 intermittent left lower quadrant discomfort-not associated with recent bleeding episode.  Plan; Patient will be scheduled for colonoscopy with Dr. Marina GoodellPerry. Procedure was discussed in detail with the patient including risks and benefits and he is agreeable to proceed. Patient was advised to call should he have any recurrence of bleeding in the interim before colonoscopy is done.   Brian Degeorge Oswald HillockS Jaimeson Gopal PA-C 09/28/2017   Cc: Shirline FreesNafziger, Cory, NP

## 2017-09-28 NOTE — Progress Notes (Signed)
Assessment and plans reviewed  

## 2017-09-29 ENCOUNTER — Telehealth: Payer: Self-pay | Admitting: Internal Medicine

## 2017-10-04 NOTE — Telephone Encounter (Signed)
Left message for patient that I would leave a sample of suprep up front for him to pick up

## 2017-10-13 ENCOUNTER — Encounter: Payer: Self-pay | Admitting: Internal Medicine

## 2017-10-24 ENCOUNTER — Telehealth: Payer: Self-pay | Admitting: Physician Assistant

## 2017-10-24 ENCOUNTER — Encounter: Payer: Medicare Other | Admitting: Internal Medicine

## 2017-10-24 NOTE — Telephone Encounter (Signed)
Spoke with patient this morning regarding his colonoscopy scheduled for tomorrow 10/25/17 at 9am.  Answered questions regarding prep and that he should be completed 5 hours prior to time of scheduled procedure. Also let patient know that we are planning delayed opening at this time, but this could change. Let him know he would be getting a call from out office in regards to when to arrive for procedure tomorrow or if this is cancelled. He verbalized understanding.  Hyacinth MeekerJennifer Lemmon, Pa-C

## 2017-10-25 ENCOUNTER — Encounter: Payer: Medicare Other | Admitting: Internal Medicine

## 2017-10-31 ENCOUNTER — Telehealth: Payer: Self-pay

## 2017-10-31 NOTE — Telephone Encounter (Signed)
Instructions completed for new colonoscopy date of 12/13/2017 and mailed to patient.

## 2017-12-05 ENCOUNTER — Ambulatory Visit (INDEPENDENT_AMBULATORY_CARE_PROVIDER_SITE_OTHER): Payer: Medicare Other | Admitting: Family Medicine

## 2017-12-05 ENCOUNTER — Encounter: Payer: Self-pay | Admitting: Family Medicine

## 2017-12-05 VITALS — BP 130/82 | HR 64 | Temp 98.2°F | Ht 65.0 in | Wt 152.0 lb

## 2017-12-05 DIAGNOSIS — J301 Allergic rhinitis due to pollen: Secondary | ICD-10-CM | POA: Diagnosis not present

## 2017-12-05 DIAGNOSIS — J4520 Mild intermittent asthma, uncomplicated: Secondary | ICD-10-CM

## 2017-12-05 DIAGNOSIS — G7000 Myasthenia gravis without (acute) exacerbation: Secondary | ICD-10-CM | POA: Diagnosis not present

## 2017-12-05 DIAGNOSIS — Z0000 Encounter for general adult medical examination without abnormal findings: Secondary | ICD-10-CM | POA: Diagnosis not present

## 2017-12-05 DIAGNOSIS — Z8042 Family history of malignant neoplasm of prostate: Secondary | ICD-10-CM

## 2017-12-05 DIAGNOSIS — I10 Essential (primary) hypertension: Secondary | ICD-10-CM | POA: Diagnosis not present

## 2017-12-05 LAB — BASIC METABOLIC PANEL
BUN: 10 mg/dL (ref 6–23)
CHLORIDE: 100 meq/L (ref 96–112)
CO2: 33 meq/L — AB (ref 19–32)
CREATININE: 0.88 mg/dL (ref 0.40–1.50)
Calcium: 10.7 mg/dL — ABNORMAL HIGH (ref 8.4–10.5)
GFR: 107.11 mL/min (ref >60.00)
GLUCOSE: 104 mg/dL — AB (ref 70–99)
POTASSIUM: 3.7 meq/L (ref 3.5–5.1)
Sodium: 140 mEq/L (ref 135–145)

## 2017-12-05 LAB — POCT URINALYSIS DIPSTICK
BILIRUBIN UA: NEGATIVE
GLUCOSE UA: NEGATIVE
KETONES UA: NEGATIVE
Nitrite, UA: NEGATIVE
Protein, UA: NEGATIVE
SPEC GRAV UA: 1.015 (ref 1.010–1.025)
Urobilinogen, UA: 0.2 E.U./dL
pH, UA: 7 (ref 5.0–8.0)

## 2017-12-05 LAB — CBC WITH DIFFERENTIAL/PLATELET
BASOS ABS: 0 10*3/uL (ref 0.0–0.1)
BASOS PCT: 0.6 % (ref 0.0–3.0)
Eosinophils Absolute: 0 10*3/uL (ref 0.0–0.7)
Eosinophils Relative: 0.8 % (ref 0.0–5.0)
HEMATOCRIT: 44.5 % (ref 39.0–52.0)
Hemoglobin: 15 g/dL (ref 13.0–17.0)
LYMPHS ABS: 1.2 10*3/uL (ref 0.7–4.0)
LYMPHS PCT: 25.9 % (ref 12.0–46.0)
MCHC: 33.6 g/dL (ref 30.0–36.0)
MCV: 91.2 fl (ref 78.0–100.0)
MONOS PCT: 8 % (ref 3.0–12.0)
Monocytes Absolute: 0.4 10*3/uL (ref 0.1–1.0)
NEUTROS ABS: 3 10*3/uL (ref 1.4–7.7)
NEUTROS PCT: 64.7 % (ref 43.0–77.0)
Platelets: 222 10*3/uL (ref 150.0–400.0)
RBC: 4.88 Mil/uL (ref 4.22–5.81)
RDW: 13.9 % (ref 11.5–15.5)
WBC: 4.7 10*3/uL (ref 4.0–10.5)

## 2017-12-05 LAB — HEPATIC FUNCTION PANEL
ALBUMIN: 4.3 g/dL (ref 3.5–5.2)
ALT: 18 U/L (ref 0–53)
AST: 29 U/L (ref 0–37)
Alkaline Phosphatase: 88 U/L (ref 39–117)
BILIRUBIN DIRECT: 0.1 mg/dL (ref 0.0–0.3)
TOTAL PROTEIN: 7.1 g/dL (ref 6.0–8.3)
Total Bilirubin: 0.5 mg/dL (ref 0.2–1.2)

## 2017-12-05 LAB — LIPID PANEL
CHOL/HDL RATIO: 4
CHOLESTEROL: 169 mg/dL (ref 0–200)
HDL: 42.5 mg/dL (ref >39.00)
LDL Cholesterol: 108 mg/dL — ABNORMAL HIGH (ref 0–99)
NonHDL: 126.84
Triglycerides: 94 mg/dL (ref 0.0–149.0)
VLDL: 18.8 mg/dL (ref 0.0–40.0)

## 2017-12-05 LAB — TSH: TSH: 2.08 u[IU]/mL (ref 0.35–4.50)

## 2017-12-05 LAB — PSA: PSA: 4.83 ng/mL — ABNORMAL HIGH (ref 0.10–4.00)

## 2017-12-05 MED ORDER — MONTELUKAST SODIUM 10 MG PO TABS: ORAL_TABLET | ORAL | 4 refills | Status: DC

## 2017-12-05 MED ORDER — FLUTICASONE PROPIONATE HFA 44 MCG/ACT IN AERO: 1.0000 | INHALATION_SPRAY | Freq: Two times a day (BID) | RESPIRATORY_TRACT | 4 refills | Status: DC

## 2017-12-05 MED ORDER — LISINOPRIL-HYDROCHLOROTHIAZIDE 10-12.5 MG PO TABS: 1.0000 | ORAL_TABLET | Freq: Every day | ORAL | 4 refills | Status: DC

## 2017-12-05 NOTE — Progress Notes (Signed)
[name] is a 83 year old married male nonsmoker who comes in today for general physical examination because of a history of myasthenia gravis, allergic rhinitis, asthma, hypertension  His myasthenia gravis symptoms are minimal  He takes Zestoretic 10-12 0.5 daily for hypertension. BP today 130/82  He takes an aspirin tablet daily  He has a history of allergic rhinitis and asthma. He takes singular 10 mg daily along with Flovent 1 puff twice a day. Asymptomatic  He gets routine eye care............... had cataracts removed and lens implants 10 years ago...Marland Kitchen.Marland Kitchen.Marland Kitchen. dental care, last colonoscopy was 2011. Due for follow-up  Vaccinations up-to-date information given on the new shingles vaccine  He says he feels well and has no major complaints. He walks daily. His wife [name] says 8 she's concerned about it because he tends to get angry of the computer doesn't work.  14 point review of systems reviewed and otherwise negative  EKG was done because a history of hypertension. EKG was normal and unchanged  Neurologic review of systems in detail........ he is oriented 3 memory is intact  BP 130/82 (BP Location: Left Arm, Patient Position: Sitting, Cuff Size: Normal)   Pulse 64   Temp 98.2 F (36.8 C) (Oral)   Ht 5\' 5"  (1.651 m)   Wt 152 lb (68.9 kg)   BMI 25.29 kg/m  Well-developed well-nourished thin male no acute distress vital signs stable he is afebrile HEENT were pertinent and his lens implants are getting pigmented. Encouraged go back and see his ophthalmologist  Cardiopulmonary exam normal Dahms abnormal gentamicin him circumcised male rectal normal stool guaiac-negative prostate nonpalpable extremities normal skin normal peripheral pulses normal  #1 hypertension at goal... Continue current therapy  #2 myasthenia gravis asymptomatic  #3 allergic rhinitis continue Singulair  #4 mild persistent asthma..... Continue Flovent.

## 2017-12-05 NOTE — Patient Instructions (Signed)
Labs today........... I will call if this anything abnormal  Continue current meds  Follow-up in one year sooner if any problems  Continue daily exercise program  Consult with your ophthalmologist about the pigmentation of the year lens implants  Cardiac insurance company and find out where get the new shingles vaccine  You're due for a tetanus booster June 2019

## 2017-12-09 ENCOUNTER — Other Ambulatory Visit: Payer: Self-pay

## 2017-12-09 DIAGNOSIS — R972 Elevated prostate specific antigen [PSA]: Secondary | ICD-10-CM

## 2017-12-09 DIAGNOSIS — H40013 Open angle with borderline findings, low risk, bilateral: Secondary | ICD-10-CM | POA: Diagnosis not present

## 2017-12-09 DIAGNOSIS — H04123 Dry eye syndrome of bilateral lacrimal glands: Secondary | ICD-10-CM | POA: Diagnosis not present

## 2017-12-13 ENCOUNTER — Encounter: Payer: Self-pay | Admitting: Internal Medicine

## 2017-12-13 ENCOUNTER — Telehealth: Payer: Self-pay | Admitting: Internal Medicine

## 2017-12-13 ENCOUNTER — Other Ambulatory Visit: Payer: Self-pay

## 2017-12-13 ENCOUNTER — Ambulatory Visit (AMBULATORY_SURGERY_CENTER): Payer: Medicare Other | Admitting: Internal Medicine

## 2017-12-13 ENCOUNTER — Emergency Department (HOSPITAL_COMMUNITY)
Admission: EM | Admit: 2017-12-13 | Discharge: 2017-12-13 | Disposition: A | Discharge status: Home / Self Care | Payer: Medicare Other | Attending: Emergency Medicine | Admitting: Emergency Medicine

## 2017-12-13 ENCOUNTER — Emergency Department (HOSPITAL_COMMUNITY): Payer: Medicare Other

## 2017-12-13 VITALS — BP 123/79 | HR 80 | Temp 97.7°F | Resp 18 | Ht 65.5 in | Wt 156.0 lb

## 2017-12-13 DIAGNOSIS — Z86010 Personal history of colonic polyps: Secondary | ICD-10-CM

## 2017-12-13 DIAGNOSIS — N281 Cyst of kidney, acquired: Secondary | ICD-10-CM | POA: Diagnosis not present

## 2017-12-13 DIAGNOSIS — Z79899 Other long term (current) drug therapy: Secondary | ICD-10-CM | POA: Diagnosis not present

## 2017-12-13 DIAGNOSIS — Z9889 Other specified postprocedural states: Secondary | ICD-10-CM | POA: Diagnosis not present

## 2017-12-13 DIAGNOSIS — R109 Unspecified abdominal pain: Secondary | ICD-10-CM | POA: Diagnosis not present

## 2017-12-13 DIAGNOSIS — Z7982 Long term (current) use of aspirin: Secondary | ICD-10-CM | POA: Insufficient documentation

## 2017-12-13 DIAGNOSIS — Z1211 Encounter for screening for malignant neoplasm of colon: Secondary | ICD-10-CM | POA: Diagnosis not present

## 2017-12-13 DIAGNOSIS — I10 Essential (primary) hypertension: Secondary | ICD-10-CM | POA: Insufficient documentation

## 2017-12-13 DIAGNOSIS — K625 Hemorrhage of anus and rectum: Secondary | ICD-10-CM | POA: Diagnosis present

## 2017-12-13 DIAGNOSIS — J45909 Unspecified asthma, uncomplicated: Secondary | ICD-10-CM | POA: Insufficient documentation

## 2017-12-13 DIAGNOSIS — R1032 Left lower quadrant pain: Secondary | ICD-10-CM | POA: Insufficient documentation

## 2017-12-13 DIAGNOSIS — R1111 Vomiting without nausea: Secondary | ICD-10-CM | POA: Diagnosis not present

## 2017-12-13 LAB — CBC WITH DIFFERENTIAL/PLATELET
Basophils Absolute: 0 10*3/uL (ref 0.0–0.1)
Basophils Relative: 0 %
Eosinophils Absolute: 0 10*3/uL (ref 0.0–0.7)
Eosinophils Relative: 0 %
HCT: 43.1 % (ref 39.0–52.0)
Hemoglobin: 15.1 g/dL (ref 13.0–17.0)
LYMPHS ABS: 0.8 10*3/uL (ref 0.7–4.0)
Lymphocytes Relative: 8 %
MCH: 31.5 pg (ref 26.0–34.0)
MCHC: 35 g/dL (ref 30.0–36.0)
MCV: 89.8 fL (ref 78.0–100.0)
MONO ABS: 0.5 10*3/uL (ref 0.1–1.0)
Monocytes Relative: 5 %
NEUTROS ABS: 9 10*3/uL — AB (ref 1.7–7.7)
Neutrophils Relative %: 87 %
PLATELETS: 144 10*3/uL — AB (ref 150–400)
RBC: 4.8 MIL/uL (ref 4.22–5.81)
RDW: 13.5 % (ref 11.5–15.5)
WBC: 10.3 10*3/uL (ref 4.0–10.5)

## 2017-12-13 LAB — COMPREHENSIVE METABOLIC PANEL
ALK PHOS: 84 U/L (ref 38–126)
ALT: 24 U/L (ref 17–63)
AST: 48 U/L — ABNORMAL HIGH (ref 15–41)
Albumin: 4 g/dL (ref 3.5–5.0)
Anion gap: 8 (ref 5–15)
BUN: 10 mg/dL (ref 6–20)
CALCIUM: 10.1 mg/dL (ref 8.9–10.3)
CO2: 25 mmol/L (ref 22–32)
CREATININE: 0.97 mg/dL (ref 0.61–1.24)
Chloride: 105 mmol/L (ref 101–111)
Glucose, Bld: 91 mg/dL (ref 65–99)
Potassium: 4.3 mmol/L (ref 3.5–5.1)
SODIUM: 138 mmol/L (ref 135–145)
Total Bilirubin: 1.2 mg/dL (ref 0.3–1.2)
Total Protein: 7.2 g/dL (ref 6.5–8.1)

## 2017-12-13 LAB — LIPASE, BLOOD: Lipase: 25 U/L (ref 11–51)

## 2017-12-13 LAB — PROTIME-INR
INR: 1.03
Prothrombin Time: 13.4 seconds (ref 11.4–15.2)

## 2017-12-13 MED ORDER — IOPAMIDOL (ISOVUE-300) INJECTION 61%
100.0000 mL | Freq: Once | INTRAVENOUS | Status: AC | PRN
  Administered 2017-12-13: 100 mL via INTRAVENOUS

## 2017-12-13 MED ORDER — OMEPRAZOLE 20 MG PO CPDR: 20.0000 mg | DELAYED_RELEASE_CAPSULE | Freq: Every day | ORAL | 0 refills | Status: DC

## 2017-12-13 MED ORDER — PANTOPRAZOLE SODIUM 40 MG IV SOLR
40.0000 mg | Freq: Once | INTRAVENOUS | Status: AC
  Administered 2017-12-13: 40 mg via INTRAVENOUS
  Filled 2017-12-13: qty 40

## 2017-12-13 MED ORDER — IOPAMIDOL (ISOVUE-300) INJECTION 61%
INTRAVENOUS | Status: AC
  Filled 2017-12-13: qty 100

## 2017-12-13 MED ORDER — SODIUM CHLORIDE 0.9 % IV SOLN: 500.0000 mL | Freq: Once | INTRAVENOUS | Status: DC

## 2017-12-13 NOTE — ED Notes (Signed)
Bed: WA21 Expected date:  Expected time:  Means of arrival:  Comments: Ems-PAIN AFTER COLONOSCOPY

## 2017-12-13 NOTE — Op Note (Signed)
[facility] Patient Name: [name] Procedure Date: 12/13/2017 9:49 AM MRN: [id number] Endoscopist: Wilhemina Bonito. Marina Goodell , MD Age: 83 Referring MD:  Date of Birth: Mar 04, 1935 Gender: Male Account #: [id number] Procedure:                Colonoscopy Indications:              Rectal bleeding. History of non-advanced                            adenoma.Prior examinations 2006 and 2011 Medicines:                Monitored Anesthesia Care Procedure:                Pre-Anesthesia Assessment:                           - Prior to the procedure, a History and Physical                            was performed, and patient medications and                            allergies were reviewed. The patient's tolerance of                            previous anesthesia was also reviewed. The risks                            and benefits of the procedure and the sedation                            options and risks were discussed with the patient.                            All questions were answered, and informed consent                            was obtained. Prior Anticoagulants: The patient has                            taken no previous anticoagulant or antiplatelet                            agents. ASA Grade Assessment: II - A patient with                            mild systemic disease. After reviewing the risks                            and benefits, the patient was deemed in                            satisfactory condition to undergo the procedure.  After obtaining informed consent, the colonoscope                            was passed under direct vision. Throughout the                            procedure, the patient's blood pressure, pulse, and                            oxygen saturations were monitored continuously. The                            Colonoscope was introduced through the anus and                            advanced to the the cecum,  identified by                            appendiceal orifice and ileocecal valve. The                            ileocecal valve, appendiceal orifice, and rectum                            were photographed. The quality of the bowel                            preparation was excellent. The colonoscopy was                            performed without difficulty. The patient tolerated                            the procedure well. The bowel preparation used was                            SUPREP. Scope In: 10:00:30 AM Scope Out: 10:11:06 AM Scope Withdrawal Time: 0 hours 7 minutes 56 seconds  Total Procedure Duration: 0 hours 10 minutes 36 seconds  Findings:                 Non-bleeding internal hemorrhoids were found during                            retroflexion. The hemorrhoids were moderate.                           The exam was otherwise without abnormality on                            direct and retroflexion views. Complications:            No immediate complications. Estimated blood loss:                            None. Estimated Blood  Loss:     Estimated blood loss: none. Impression:               - Non-bleeding internal hemorrhoids.                           - The examination was otherwise normal on direct                            and retroflexion views.                           - No specimens collected. Recommendation:           - Repeat colonoscopy is not recommended for                            surveillance.                           - Patient has a contact number available for                            emergencies. The signs and symptoms of potential                            delayed complications were discussed with the                            patient. Return to normal activities tomorrow.                            Written discharge instructions were provided to the                            patient.                           - Resume previous diet.                            - Continue present medications. Wilhemina BonitoJohn N. Marina GoodellPerry, MD 12/13/2017 10:17:12 AM This report has been signed electronically.

## 2017-12-13 NOTE — ED Provider Notes (Signed)
[facility]-EMERGENCY DEPT Provider Note   CSN: [id number] Arrival date & time: 12/13/17  1240     History   Chief Complaint Chief Complaint  Patient presents with  . Abdominal Pain    Colonoscopy today    HPI [name] [name] is a 83 y.o. male.  HPI Patient reports that he had a colonoscopy today.  He had gotten home and was not having any problems.  He reports he had very mild left lower quadrant discomfort.  Around noon he ate and then very shortly thereafter got severe left lower quadrant pain and vomited copiously several times.  His wife reports she saw blood in the emesis.  Patient was transported by EMS and did get 100 mcg of fentanyl.  At the time of my evaluation the patient is pain-free.  He denies any ongoing nausea or abdominal pain. Past Medical History:  Diagnosis Date  . Allergy    SEASONAL  . Arthritis   . Asthma   . BPH (benign prostatic hypertrophy)   . GERD (gastroesophageal reflux disease)   . History of kidney stones   . History of myasthenia gravis    15 yrs ago - no current problem or treatment  . Hypertension   . Mild carotid artery disease (HCC)    bilateral ICA 0-39%  per duplex 02-16-2013  . Multiple pulmonary nodules    per last CT stable  . Nephrolithiasis    BILATERAL    Patient Active Problem List   Diagnosis Date Noted  . Routine general medical examination at a health care facility 12/05/2017  . Hypercalcemia 11/19/2015  . Family history of prostate cancer 09/10/2015  . Recurrent kidney stones 03/18/2015  . History of multiple pulmonary nodules 04/10/2014  . ERECTILE DYSFUNCTION, ORGANIC 05/16/2009  . OSTEOARTHRITIS 11/20/2008  . MIGRAINE, OPHTHALMIC 03/21/2007  . MYASTHENIA GRAVIS W/O (ACUTE) EXACERBATION 03/21/2007  . Essential hypertension 03/21/2007  . Allergic rhinitis 03/21/2007  . Asthma 03/21/2007  . GERD 03/21/2007  . HERNIA 03/21/2007    Past Surgical History:  Procedure Laterality Date  .  CATARACT EXTRACTION W/ INTRAOCULAR LENS  IMPLANT, BILATERAL  2007  . COLONOSCOPY    . COLONOSCOPY W/ POLYPECTOMY  05-14-2010  . CYSTO/  LEFT RETROGRADE PYELOGRAM/  LEFT URETEROSCOPY/  LEFT STENT PLACEMENT  06-17-2008  . CYSTOSCOPY W/ RETROGRADES Bilateral 03/24/2015   Procedure: CYSTOSCOPY WITH RETROGRADE PYELOGRAM;  Surgeon: Ihor Gully, MD;  Location: [facility];  Service: Urology;  Laterality: Bilateral;  . CYSTOSCOPY WITH STENT PLACEMENT Right 03/24/2015   Procedure: CYSTOSCOPY WITH STENT PLACEMENT;  Surgeon: Ihor Gully, MD;  Location: [facility];  Service: Urology;  Laterality: Right;  . CYSTOSCOPY WITH URETEROSCOPY Right 03/24/2015   Procedure: CYSTOSCOPY WITH URETEROSCOPY;  Surgeon: Ihor Gully, MD;  Location: [facility];  Service: Urology;  Laterality: Right;  . EXTRACORPOREAL SHOCK WAVE LITHOTRIPSY Bilateral left 02-23-2010 //   right 09-28-2010  . HOLMIUM LASER APPLICATION Right 03/24/2015   Procedure: HOLMIUM LASER APPLICATION;  Surgeon: Ihor Gully, MD;  Location: [facility];  Service: Urology;  Laterality: Right;  . INGUINAL HERNIA REPAIR Bilateral 2002 approx.  Marland Kitchen NEPHROLITHOTOMY Left 05/30/2015   Procedure: LEFT PERCUTANEOUS NEPHROLITHOTOMY;  Surgeon: Ihor Gully, MD;  Location: [facility];  Service: Urology;  Laterality: Left;  . POLYPECTOMY    . STONE EXTRACTION WITH BASKET Right 03/24/2015   Procedure: STONE EXTRACTION WITH BASKET;  Surgeon: Ihor Gully, MD;  Location: [facility];  Service:  Urology;  Laterality: Right;       Home Medications    Prior to Admission medications   Medication Sig Start Date End Date Taking? Authorizing Provider  aspirin 81 MG EC tablet Take 81 mg by mouth every morning.    Yes [provider]  calcium carbonate (TUMS - DOSED IN MG ELEMENTAL CALCIUM) 500 MG chewable tablet Chew 1 tablet by mouth as needed for indigestion or heartburn. Reported on 11/19/2015   Yes  [provider]  fluticasone (FLOVENT HFA) 44 MCG/ACT inhaler Inhale 1 puff into the lungs 2 (two) times daily. 12/05/17  Yes Roderick Peeodd, Jeffrey A, MD  ibuprofen (ADVIL,MOTRIN) 200 MG tablet Take 200 mg by mouth every 6 (six) hours as needed for fever or moderate pain.    Yes [provider]  lisinopril-hydrochlorothiazide (PRINZIDE,ZESTORETIC) 10-12.5 MG tablet Take 1 tablet by mouth daily. 12/05/17  Yes Roderick Peeodd, Jeffrey A, MD  montelukast (SINGULAIR) 10 MG tablet TAKE 1 BY MOUTH AT BEDTIME 12/05/17  Yes Roderick Peeodd, Jeffrey A, MD  Multiple Vitamin (MULTIVITAMIN) tablet Take 1 tablet by mouth every morning. Reported on 11/19/2015   Yes [provider]  Omega-3 Fatty Acids (FISH OIL) 1200 MG CAPS Take 1 capsule by mouth every morning.    Yes [provider]  polyvinyl alcohol (LIQUIFILM TEARS) 1.4 % ophthalmic solution Place 1 drop into both eyes daily as needed for dry eyes. Reported on 11/19/2015   Yes [provider]  hydrocortisone (ANUSOL-HC) 25 MG suppository Place 1 suppository (25 mg total) rectally 2 (two) times daily. Patient not taking: Reported on 12/13/2017 09/13/17   Shirline FreesNafziger, Cory, NP  omeprazole (PRILOSEC) 20 MG capsule Take 1 capsule (20 mg total) by mouth daily. 12/13/17   Arby BarrettePfeiffer, Olliver Boyadjian, MD    Family History Family History  Problem Relation Age of Onset  . Heart disease Mother   . Hypertension Mother   . Kidney disease Mother   . Heart disease Father   . Hypertension Father   . Diabetes Sister   . Diabetes Sister   . Hypercalcemia Neg Hx     Social History Social History   Tobacco Use  . Smoking status: Never Smoker  . Smokeless tobacco: Never Used  Substance Use Topics  . Alcohol use: No    Alcohol/week: 0.0 oz  . Drug use: No     Allergies   Patient has no known allergies.   Review of Systems Review of Systems 10 Systems reviewed and are negative for acute change except as noted in the HPI.   Physical Exam Updated Vital  Signs BP (!) 143/92   Pulse 77   Temp (!) 96.7 F (35.9 C) (Axillary)   Resp 18   Ht 5' 5.5" (1.664 m)   Wt 70.8 kg (156 lb)   SpO2 98%   BMI 25.56 kg/m   Physical Exam  Constitutional: He is oriented to person, place, and time. He appears well-developed and well-nourished.  HENT:  Head: Normocephalic and atraumatic.  Eyes: Conjunctivae are normal.  Neck: Neck supple.  Cardiovascular: Normal rate and regular rhythm.  No murmur heard. Pulmonary/Chest: Effort normal and breath sounds normal. No respiratory distress.  Abdominal: Soft. There is no tenderness.  Musculoskeletal: He exhibits no edema.  Neurological: He is alert and oriented to person, place, and time. No cranial nerve deficit. He exhibits normal muscle tone. Coordination normal.  Skin: Skin is warm and dry.  Psychiatric: He has a normal mood and affect.  Nursing note and vitals reviewed.  ED Treatments / Results  Labs (all labs ordered are listed, but only abnormal results are displayed) Labs Reviewed  COMPREHENSIVE METABOLIC PANEL - Abnormal; Notable for the following components:      Result Value   AST 48 (*)    All other components within normal limits  CBC WITH DIFFERENTIAL/PLATELET - Abnormal; Notable for the following components:   Platelets 144 (*)    Neutro Abs 9.0 (*)    All other components within normal limits  LIPASE, BLOOD  PROTIME-INR    EKG  EKG Interpretation None       Radiology Ct Abdomen Pelvis W Contrast  Result Date: 12/13/2017 CLINICAL DATA:  He had a colonoscopy done this morning at [facility]. Home at 1130, ate normal lunch, 15 mins after c/o sever abd pain, nausea and vomit x1. Pink tinged vomit noted. Did not eat anything red. No swelling or distention. Pain increases with palpation mostly in LLQ. Comes and goes 4/10 and 10/10. Had colonoscopy in the past without issues. EXAM: CT ABDOMEN AND PELVIS WITH CONTRAST TECHNIQUE: Multidetector CT imaging of the abdomen and pelvis  was performed using the standard protocol following bolus administration of intravenous contrast. CONTRAST:  ISOVUE-300 IOPAMIDOL (ISOVUE-300) INJECTION 61% COMPARISON:  CT, 03/13/2015 FINDINGS: Lower chest: Several right lower lobe lung nodules, largest measuring 5 mm. These are essentially stable from a chest CT dated 04/05/2014. No acute findings at the lung bases. Hepatobiliary: Liver normal in size. Multiple low-density, nonenhancing, liver lesions are noted, largest arising from the posteromedial right lower lobe measuring 2 cm. This has slightly increased in size from the prior CT but is otherwise stable. These lesions are consistent with cysts. No other liver lesions. Small dependent gallstone, stable. No gallbladder wall thickening or pericholecystic fluid. No bile duct dilation. Pancreas: Unremarkable. No pancreatic ductal dilatation or surrounding inflammatory changes. Spleen: Normal in size without focal abnormality. Adrenals/Urinary Tract: No adrenal masses. Bilateral low-density renal masses consistent with cysts. Focal renal scarring in the midpole of the left kidney. Bilateral renal cortical thinning. There are bilateral nonobstructing intrarenal stones. Mild to moderate dilation of the left renal pelvis, stable. There are 2 prominent renal pelvis stones, largest measuring 12 mm. Ureters are normal course and in caliber. No ureteral stones. Bladder is unremarkable. Stomach/Bowel: Colon is normal in caliber. No colonic wall thickening to suggest a contusion/hematoma. No colonic inflammation. There is no extraluminal/intraperitoneal air to suggest colonic perforation. Appendix not visualized. No evidence of appendicitis. Stomach and small bowel are unremarkable. Vascular/Lymphatic: Aortic atherosclerosis. No enlarged abdominal or pelvic lymph nodes. Reproductive: Prostate mild to moderately enlarged, stable from the prior CT. Other: No abdominal wall hernia.  No ascites. Musculoskeletal: No  fracture or acute finding. No osteoblastic or osteolytic lesions. IMPRESSION: 1. No acute findings within the abdomen or pelvis. No evidence of a complication from colonoscopy. 2. Several stable right lower lobe nodules. 3. Multiple liver and renal cysts. 4. None obstructing intrarenal stones. Chronic mild to moderate dilation of the left renal pelvis. No ureteral stones. 5. Aortic atherosclerosis. 6. Chronic prostatic hypertrophy. Electronically Signed   By: Amie Portland M.D.   On: 12/13/2017 16:12    Procedures Procedures (including critical care time)  Medications Ordered in ED Medications  iopamidol (ISOVUE-300) 61 % injection (not administered)  pantoprazole (PROTONIX) injection 40 mg (40 mg Intravenous Given 12/13/17 1459)  iopamidol (ISOVUE-300) 61 % injection 100 mL (100 mLs Intravenous Contrast Given 12/13/17 1545)     Initial Impression / Assessment and Plan /  ED Course  I have reviewed the triage vital signs and the nursing notes.  Pertinent labs & imaging results that were available during my care of the patient were reviewed by me and considered in my medical decision making (see chart for details).      Final Clinical Impressions(s) / ED Diagnoses   Final diagnoses:  Left lower quadrant pain  Status post colonoscopy   CT is negative.  Patient has remained pain-free without emesis.  At this time I do feel he is stable for discharge.  Return precautions reviewed.  Patient will be put on 1 week of Prilosec. ED Discharge Orders        Ordered    omeprazole (PRILOSEC) 20 MG capsule  Daily     12/13/17 1652       Arby Barrette, MD 12/13/17 1653

## 2017-12-13 NOTE — Telephone Encounter (Signed)
Pain is worse. Spouse called EMS and they are transporting patient to ED.

## 2017-12-13 NOTE — Telephone Encounter (Signed)
Negative ER evaluation including labs and CT. Discharged home asymptomatic.

## 2017-12-13 NOTE — Patient Instructions (Signed)
YOU HAD AN ENDOSCOPIC PROCEDURE TODAY AT THE [facility]:   Refer to the procedure report that was given to you for any specific questions about what was found during the examination.  If the procedure report does not answer your questions, please call your gastroenterologist to clarify.  If you requested that your care partner not be given the details of your procedure findings, then the procedure report has been included in a sealed envelope for you to review at your convenience later.  YOU SHOULD EXPECT: Some feelings of bloating in the abdomen. Passage of more gas than usual.  Walking can help get rid of the air that was put into your GI tract during the procedure and reduce the bloating. If you had a lower endoscopy (such as a colonoscopy or flexible sigmoidoscopy) you may notice spotting of blood in your stool or on the toilet paper. If you underwent a bowel prep for your procedure, you may not have a normal bowel movement for a few days.  Please Note:  You might notice some irritation and congestion in your nose or some drainage.  This is from the oxygen used during your procedure.  There is no need for concern and it should clear up in a day or so.  SYMPTOMS TO REPORT IMMEDIATELY:   Following lower endoscopy (colonoscopy or flexible sigmoidoscopy):  Excessive amounts of blood in the stool  Significant tenderness or worsening of abdominal pains  Swelling of the abdomen that is new, acute  Fever of 100F or higher  For urgent or emergent issues, a gastroenterologist can be reached at any hour by calling [phone].   DIET:  We do recommend a small meal at first, but then you may proceed to your regular diet.  Drink plenty of fluids but you should avoid alcoholic beverages for 24 hours.  MEDICATIONS: Continue present medications.  Please see handouts given to you by your recovery nurse.  ACTIVITY:  You should plan to take it easy for the rest of today and you should NOT  DRIVE or use heavy machinery until tomorrow (because of the sedation medicines used during the test).    FOLLOW UP: Our staff will call the number listed on your records the next business day following your procedure to check on you and address any questions or concerns that you may have regarding the information given to you following your procedure. If we do not reach you, we will leave a message.  However, if you are feeling well and you are not experiencing any problems, there is no need to return our call.  We will assume that you have returned to your regular daily activities without incident.  If any biopsies were taken you will be contacted by phone or by letter within the next 1-3 weeks.  Please call us at [phone] if you have not heard about the biopsies in 3 weeks.   Thank you for allowing us to provide for your healthcare needs today.   SIGNATURES/CONFIDENTIALITY: You and/or your care partner have signed paperwork which will be entered into your electronic medical record.  These signatures attest to the fact that that the information above on your After Visit Summary has been reviewed and is understood.  Full responsibility of the confidentiality of this discharge information lies with you and/or your care-partner. 

## 2017-12-13 NOTE — Progress Notes (Signed)
A and O x3. Report to RN. Tolerated MAC anesthesia well.

## 2017-12-13 NOTE — ED Triage Notes (Signed)
Per EMS, patient comes from home. He had a colonoscopy done this morning at [facility]. Home at 1130, ate normal lunch, 15 mins after c/o sever abd pain, nausea and vomit x1. Pink tinged vomit noted. Did not eat anything red. No swelling or distention. Pain increases with palpation mostly in LLQ. Comes and goes 4/10 and 10/10. Had colonoscopy in the past without issues. GI docs aware pt is here. 100mcg fent given, no pain after. 12 lead at bedside.

## 2017-12-13 NOTE — Telephone Encounter (Signed)
Exam was seemingly uncomplicated. Not certain why he is having pain. Agree with thorough formal evaluation as planned

## 2017-12-14 ENCOUNTER — Telehealth: Payer: Self-pay | Admitting: *Deleted

## 2017-12-14 NOTE — Telephone Encounter (Signed)
  Follow up Call-  Call back number 12/13/2017  Post procedure Call Back phone  # [phone]  Permission to leave phone message Yes  Some recent data might be hidden     Patient questions:  Do you have a fever, pain , or abdominal swelling? No. Pain Score  0 *  Have you tolerated food without any problems? Yes.    Have you been able to return to your normal activities? Yes.    Do you have any questions about your discharge instructions: Diet   No. Medications  No. Follow up visit  No.  Do you have questions or concerns about your Care? No.  Actions: * If pain score is 4 or above: No action needed, pain <4.pt's wife [facility] and vomiting. Pt's wife said they were told he has a kidney stone which was causing his symptoms.

## 2018-03-09 ENCOUNTER — Other Ambulatory Visit: Payer: Medicare Other

## 2018-03-13 ENCOUNTER — Other Ambulatory Visit (INDEPENDENT_AMBULATORY_CARE_PROVIDER_SITE_OTHER): Payer: Medicare Other

## 2018-03-13 DIAGNOSIS — R972 Elevated prostate specific antigen [PSA]: Secondary | ICD-10-CM | POA: Diagnosis not present

## 2018-03-13 LAB — PSA: PSA: 6.8 ng/mL — AB (ref 0.10–4.00)

## 2018-03-14 LAB — PSA, TOTAL AND FREE
PSA, % Free: 9 % (calc) — ABNORMAL LOW (ref >25)
PSA, FREE: 0.6 ng/mL
PSA, Total: 6.6 ng/mL — ABNORMAL HIGH (ref <=4.0)

## 2018-03-16 ENCOUNTER — Other Ambulatory Visit: Payer: Self-pay | Admitting: Family Medicine

## 2018-03-16 DIAGNOSIS — R972 Elevated prostate specific antigen [PSA]: Secondary | ICD-10-CM

## 2018-03-27 DIAGNOSIS — N200 Calculus of kidney: Secondary | ICD-10-CM | POA: Diagnosis not present

## 2018-03-27 DIAGNOSIS — R972 Elevated prostate specific antigen [PSA]: Secondary | ICD-10-CM | POA: Diagnosis not present

## 2018-03-29 ENCOUNTER — Other Ambulatory Visit: Payer: Self-pay | Admitting: Family Medicine

## 2018-04-07 ENCOUNTER — Ambulatory Visit: Payer: Medicare Other

## 2018-06-27 DIAGNOSIS — R972 Elevated prostate specific antigen [PSA]: Secondary | ICD-10-CM | POA: Diagnosis not present

## 2018-09-18 ENCOUNTER — Encounter: Payer: Self-pay | Admitting: Family Medicine

## 2018-09-18 ENCOUNTER — Ambulatory Visit: Payer: Medicare Other | Admitting: Family Medicine

## 2018-09-18 VITALS — BP 130/82 | HR 76 | Temp 97.6°F | Resp 16 | Ht 65.5 in | Wt 154.4 lb

## 2018-09-18 DIAGNOSIS — J301 Allergic rhinitis due to pollen: Secondary | ICD-10-CM

## 2018-09-18 DIAGNOSIS — M159 Polyosteoarthritis, unspecified: Secondary | ICD-10-CM | POA: Diagnosis not present

## 2018-09-18 DIAGNOSIS — J4520 Mild intermittent asthma, uncomplicated: Secondary | ICD-10-CM

## 2018-09-18 DIAGNOSIS — Z23 Encounter for immunization: Secondary | ICD-10-CM

## 2018-09-18 DIAGNOSIS — I10 Essential (primary) hypertension: Secondary | ICD-10-CM | POA: Diagnosis not present

## 2018-09-18 MED ORDER — FLUTICASONE PROPIONATE HFA 44 MCG/ACT IN AERO: INHALATION_SPRAY | RESPIRATORY_TRACT | 3 refills | Status: DC

## 2018-09-18 MED ORDER — LISINOPRIL 20 MG PO TABS: 20.0000 mg | ORAL_TABLET | Freq: Every day | ORAL | 2 refills | Status: DC

## 2018-09-18 NOTE — Assessment & Plan Note (Signed)
BP adequately controlled but because side effects from HCTZ, it will be discontinued. Lisinopril increased from 10 mg to 20 mg. Eye exam is current. Continue low-salt diet. Continue monitoring BP regularly. I will see him back in 6 months for his CPE and then annually.

## 2018-09-18 NOTE — Assessment & Plan Note (Signed)
Problem is well controlled. No changes in Flovent 44 mcg 1 puff twice daily. Continue Singulair 10 mg daily. Albuterol inhaler 2 puff every 4-6 hours as needed. Follow-up annually.

## 2018-09-18 NOTE — Assessment & Plan Note (Signed)
We discussed prognosis and treatment options. We discussed side effects of chronic use of NSAIDs. Fall precautions.

## 2018-09-18 NOTE — Progress Notes (Addendum)
HPI:   [name] is a 83 y.o. male, who is here today to establish care.  Former PCP: Dr Tawanna Cooler Last preventive routine visit: 03/2017  Chronic medical problems: Hypertension, asthma, nephrolithiasis, primary hyperparathyroidism, OA, and abnormal PSA.  He follows with urologist, last PSA in 06/2017 within normal range, 3.9.  According to patient, he is now to see urologist annually.  Independent ADLs and IADLs. He lives with his wife and grandson.   Asthma and allergic rhinitis: Currently he is on Flovent HFA 44 mcg/act 1 puff twice daily. He has not needed albuterol inhaler for years. He is also on Singulair 10 mg daily.  No asthma exacerbating or alleviating factors identified. Rhinitis exacerbated by seasonal changes.  Tolerating medications well, denies side effects.  Hypertension:  Dx in his mid 33's. Currently on lisinopril-HCTZ 10-12.5 mg, which he is taking 4-5 times a week because HCTZ was "drying" him down.  Home BP readings 130s/70s. Eye exam within the last year, he is due later this year.  He has not noted unusual headache, visual changes, exertional chest pain, dyspnea,  focal weakness, or edema.   Lab Results  Component Value Date   CREATININE 0.97 12/13/2017   BUN 10 12/13/2017   NA 138 12/13/2017   K 4.3 12/13/2017   CL 105 12/13/2017   CO2 25 12/13/2017    Generalized OA: IP, lower back, and knee achy pain, intermittently. He takes ibuprofen 200 mg daily. Pain aggravated by prolonged walking, standing. Alleviated by rest. No edema or erythema. No limitation of daily activities.    Review of Systems  Constitutional: Negative for activity change, appetite change, fatigue and fever.  HENT: Negative for nosebleeds, sore throat and trouble swallowing.   Eyes: Negative for redness and visual disturbance.  Respiratory: Negative for apnea, cough, shortness of breath and wheezing.   Cardiovascular: Negative for chest pain,  palpitations and leg swelling.  Gastrointestinal: Negative for abdominal pain, nausea and vomiting.  Genitourinary: Negative for decreased urine volume, dysuria and hematuria.  Musculoskeletal: Positive for arthralgias and back pain. Negative for gait problem and myalgias.  Neurological: Negative for syncope, weakness and headaches.      Current Outpatient Medications on File Prior to Visit  Medication Sig Dispense Refill  . aspirin 81 MG EC tablet Take 81 mg by mouth every morning.     . calcium carbonate (TUMS - DOSED IN MG ELEMENTAL CALCIUM) 500 MG chewable tablet Chew 1 tablet by mouth as needed for indigestion or heartburn. Reported on 11/19/2015    . hydrocortisone (ANUSOL-HC) 25 MG suppository Place 1 suppository (25 mg total) rectally 2 (two) times daily. 12 suppository 0  . ibuprofen (ADVIL,MOTRIN) 200 MG tablet Take 200 mg by mouth every 6 (six) hours as needed for fever or moderate pain.     . montelukast (SINGULAIR) 10 MG tablet TAKE 1 BY MOUTH AT BEDTIME (Patient taking differently: Take 10 mg by mouth at bedtime. ) 90 tablet 4  . Multiple Vitamin (MULTIVITAMIN) tablet Take 1 tablet by mouth every morning. Reported on 11/19/2015    . Omega-3 Fatty Acids (FISH OIL) 1200 MG CAPS Take 1,200 mg by mouth every morning.     . polyvinyl alcohol (LIQUIFILM TEARS) 1.4 % ophthalmic solution Place 1 drop into both eyes daily as needed for dry eyes. Reported on 11/19/2015     No current facility-administered medications on file prior to visit.      Past Medical History:  Diagnosis Date  .  Allergy    SEASONAL  . Arthritis   . Asthma   . BPH (benign prostatic hypertrophy)   . GERD (gastroesophageal reflux disease)   . History of kidney stones   . History of myasthenia gravis    15 yrs ago - no current problem or treatment  . Hypertension   . Mild carotid artery disease (HCC)    bilateral ICA 0-39%  per duplex 02-16-2013  . Multiple pulmonary nodules    per last CT stable  .  Nephrolithiasis    BILATERAL   No Known Allergies  Family History  Problem Relation Age of Onset  . Heart disease Mother   . Hypertension Mother   . Kidney disease Mother   . Heart disease Father   . Hypertension Father   . Diabetes Sister   . Diabetes Sister   . Hypercalcemia Neg Hx     Social History   Socioeconomic History  . Marital status: Married    Spouse name: Not on file  . Number of children: Not on file  . Years of education: Not on file  . Highest education level: Not on file  Occupational History  . Not on file  Social Needs  . Financial resource strain: Not on file  . Food insecurity:    Worry: Not on file    Inability: Not on file  . Transportation needs:    Medical: Not on file    Non-medical: Not on file  Tobacco Use  . Smoking status: Never Smoker  . Smokeless tobacco: Never Used  Substance and Sexual Activity  . Alcohol use: No    Alcohol/week: 0.0 standard drinks  . Drug use: No  . Sexual activity: Not on file  Lifestyle  . Physical activity:    Days per week: Not on file    Minutes per session: Not on file  . Stress: Not on file  Relationships  . Social connections:    Talks on phone: Not on file    Gets together: Not on file    Attends religious service: Not on file    Active member of club or organization: Not on file    Attends meetings of clubs or organizations: Not on file    Relationship status: Not on file  Other Topics Concern  . Not on file  Social History Narrative  . Not on file    Vitals:   09/18/18 1413  BP: 130/82  Pulse: 76  Resp: 16  Temp: 97.6 F (36.4 C)  SpO2: 100%    Body mass index is 25.3 kg/m.   Physical Exam  Nursing note reviewed. Constitutional: He is oriented to person, place, and time. He appears well-developed and well-nourished. No distress.  HENT:  Head: Normocephalic and atraumatic.  Mouth/Throat: Oropharynx is clear and moist and mucous membranes are normal. He has dentures.  Eyes:  Pupils are equal, round, and reactive to light. Conjunctivae are normal.  Cardiovascular: Normal rate and regular rhythm.  Murmur (?  Soft SEM RUSB) heard. Pulses:      Posterior tibial pulses are 2+ on the right side, and 2+ on the left side.  Respiratory: Effort normal and breath sounds normal. No respiratory distress.  GI: Soft. He exhibits no mass. There is no hepatomegaly. There is no tenderness.  Musculoskeletal: He exhibits no edema.  No signs of synovitis or significant joint deformity.  Lymphadenopathy:    He has no cervical adenopathy.  Neurological: He is alert and oriented to person, place,  and time. He has normal strength. No cranial nerve deficit. Gait normal.  Skin: Skin is warm. No rash noted. No erythema.  Psychiatric: He has a normal mood and affect. Cognition and memory are normal.  Well groomed, good eye contact.      ASSESSMENT AND PLAN:   [name] was seen today for transfer of care.  Diagnoses and all orders for this visit:  Asthma Problem is well controlled. No changes in Flovent 44 mcg 1 puff twice daily. Continue Singulair 10 mg daily. Albuterol inhaler 2 puff every 4-6 hours as needed. Follow-up annually.  Essential hypertension BP adequately controlled but because side effects from HCTZ, it will be discontinued. Lisinopril increased from 10 mg to 20 mg. Eye exam is current. Continue low-salt diet. Continue monitoring BP regularly. I will see him back in 6 months for his CPE and then annually.  Osteoarthritis, multiple sites We discussed prognosis and treatment options. We discussed side effects of chronic use of NSAIDs. Fall precautions.  Allergic rhinitis Problem is well controlled with Singulair 10 mg daily. No changes in current management.      Lissa Rowles G. [place], MD  [facility]. [place] office.

## 2018-09-18 NOTE — Patient Instructions (Addendum)
A few things to remember from today's visit:   Mild intermittent asthma without complication - Plan: fluticasone (FLOVENT HFA) 44 MCG/ACT inhaler  Essential hypertension - Plan: lisinopril (PRINIVIL,ZESTRIL) 20 MG tablet  Osteoarthritis of multiple joints, unspecified osteoarthritis type   Hydrochlorothiazide discontinued today because dehydration. Lisinopril increased from 10 mg to 20 mg. Continue checking your blood pressure at home. Remember to have eye exam done this year.   Please be sure medication list is accurate. If a new problem present, please set up appointment sooner than planned today.

## 2018-09-18 NOTE — Assessment & Plan Note (Signed)
Problem is well controlled with Singulair 10 mg daily. No changes in current management.

## 2019-01-23 ENCOUNTER — Other Ambulatory Visit: Payer: Self-pay | Admitting: Family Medicine

## 2019-01-23 DIAGNOSIS — J4520 Mild intermittent asthma, uncomplicated: Secondary | ICD-10-CM

## 2019-01-23 MED ORDER — FLUTICASONE PROPIONATE HFA 44 MCG/ACT IN AERO: INHALATION_SPRAY | RESPIRATORY_TRACT | 3 refills | Status: DC

## 2019-01-23 NOTE — Telephone Encounter (Signed)
Requested Prescriptions  Pending Prescriptions Disp Refills  . fluticasone (FLOVENT HFA) 44 MCG/ACT inhaler 3 Inhaler 3    Sig: INHALE 1 PUFF BY MOUTH INTO THE LUNGS TWICE DAILY     Pulmonology:  Corticosteroids Passed - 01/23/2019 12:00 PM      Passed - Valid encounter within last 12 months    Recent Outpatient Visits          4 months ago Mild intermittent asthma without complication   [facility] conservation officer at [facility] [place], Timoteo Expose, MD   1 year ago Essential hypertension   Nature conservation officer at [facility], Eugenio Hoes, MD   1 year ago Rectal bleeding   Nature conservation officer at [facility], [place], NP   2 years ago Essential hypertension   Nature conservation officer at [facility], Eugenio Hoes, MD   3 years ago Needs flu shot   Nature conservation officer at [facility], Eugenio Hoes, MD      Future Appointments            In 1 month [place], Timoteo Expose, MD [employer] [place], [facility]

## 2019-01-23 NOTE — Telephone Encounter (Signed)
Copied from CRM [id number]. Topic: Quick Communication - Rx Refill/Question >> Jan 23, 2019 11:51 AM Richarda Blade wrote: Medication: fluticasone (FLOVENT HFA) 44 MCG/ACT inhaler   Has the patient contacted their pharmacy? Yes. This was done last year and patient is saying he has not received the inhaler  Preferred Pharmacy (with phone number or street name): [facility] - [place], [place] - [address] [phone] (Phone) [phone] (Fax)    Agent: Please be advised that RX refills may take up to 3 business days. We ask that you follow-up with your pharmacy.

## 2019-02-19 ENCOUNTER — Telehealth: Payer: Self-pay | Admitting: Family Medicine

## 2019-02-19 NOTE — Telephone Encounter (Signed)
Patient has some questions about the medication Montelukast.  Phone number is [phone]

## 2019-02-19 NOTE — Telephone Encounter (Signed)
Patient called and he says he spoke with the pharmacy and was told they would have to contact the office because Dr. Tawanna Cooler refilled the singulair last. I advised I will send the request to Dr. [place] to refill. He also asked if Dr. [place] will send in Albuterol inhaler just to have on hand, in case he needs it.

## 2019-02-20 ENCOUNTER — Other Ambulatory Visit: Payer: Self-pay | Admitting: *Deleted

## 2019-02-20 MED ORDER — MONTELUKAST SODIUM 10 MG PO TABS: 10.0000 mg | ORAL_TABLET | Freq: Every day | ORAL | 3 refills | Status: DC

## 2019-02-20 NOTE — Telephone Encounter (Signed)
Patient would like albuterol inhaler sent to the pharmacy, please advise.

## 2019-02-21 ENCOUNTER — Other Ambulatory Visit: Payer: Self-pay | Admitting: *Deleted

## 2019-02-21 MED ORDER — MONTELUKAST SODIUM 10 MG PO TABS: 10.0000 mg | ORAL_TABLET | Freq: Every day | ORAL | 3 refills | Status: DC

## 2019-02-26 ENCOUNTER — Other Ambulatory Visit: Payer: Self-pay | Admitting: Family Medicine

## 2019-02-26 MED ORDER — ALBUTEROL SULFATE HFA 108 (90 BASE) MCG/ACT IN AERS: 2.0000 | INHALATION_SPRAY | Freq: Four times a day (QID) | RESPIRATORY_TRACT | 2 refills | Status: DC | PRN

## 2019-02-26 NOTE — Telephone Encounter (Signed)
Rx for Albuterol sent to his local pharmacy. If he needs Rx to go to mail pharmacy,please re-send.  Thanks, BJ

## 2019-03-19 ENCOUNTER — Encounter: Payer: Medicare Other | Admitting: Family Medicine

## 2019-04-03 DIAGNOSIS — R972 Elevated prostate specific antigen [PSA]: Secondary | ICD-10-CM | POA: Diagnosis not present

## 2019-04-03 DIAGNOSIS — N200 Calculus of kidney: Secondary | ICD-10-CM | POA: Diagnosis not present

## 2019-05-30 ENCOUNTER — Other Ambulatory Visit: Payer: Self-pay | Admitting: *Deleted

## 2019-05-30 DIAGNOSIS — I10 Essential (primary) hypertension: Secondary | ICD-10-CM

## 2019-05-30 MED ORDER — LISINOPRIL 20 MG PO TABS: 20.0000 mg | ORAL_TABLET | Freq: Every day | ORAL | 0 refills | Status: DC

## 2019-06-18 DIAGNOSIS — E785 Hyperlipidemia, unspecified: Secondary | ICD-10-CM | POA: Insufficient documentation

## 2019-06-19 ENCOUNTER — Ambulatory Visit (INDEPENDENT_AMBULATORY_CARE_PROVIDER_SITE_OTHER): Payer: Medicare Other | Admitting: Family Medicine

## 2019-06-19 ENCOUNTER — Encounter: Payer: Self-pay | Admitting: Family Medicine

## 2019-06-19 ENCOUNTER — Other Ambulatory Visit: Payer: Self-pay

## 2019-06-19 VITALS — BP 140/92 | HR 67 | Temp 98.3°F | Resp 12 | Ht 64.0 in | Wt 149.8 lb

## 2019-06-19 DIAGNOSIS — K111 Hypertrophy of salivary gland: Secondary | ICD-10-CM | POA: Diagnosis not present

## 2019-06-19 DIAGNOSIS — Z0000 Encounter for general adult medical examination without abnormal findings: Secondary | ICD-10-CM

## 2019-06-19 DIAGNOSIS — J4520 Mild intermittent asthma, uncomplicated: Secondary | ICD-10-CM

## 2019-06-19 DIAGNOSIS — E785 Hyperlipidemia, unspecified: Secondary | ICD-10-CM | POA: Diagnosis not present

## 2019-06-19 DIAGNOSIS — I10 Essential (primary) hypertension: Secondary | ICD-10-CM | POA: Diagnosis not present

## 2019-06-19 DIAGNOSIS — R221 Localized swelling, mass and lump, neck: Secondary | ICD-10-CM

## 2019-06-19 LAB — CBC WITH DIFFERENTIAL/PLATELET
Basophils Absolute: 0 10*3/uL (ref 0.0–0.1)
Basophils Relative: 0.6 % (ref 0.0–3.0)
Eosinophils Absolute: 0.1 10*3/uL (ref 0.0–0.7)
Eosinophils Relative: 2 % (ref 0.0–5.0)
HCT: 41.3 % (ref 39.0–52.0)
Hemoglobin: 14 g/dL (ref 13.0–17.0)
Lymphocytes Relative: 27.3 % (ref 12.0–46.0)
Lymphs Abs: 1.8 10*3/uL (ref 0.7–4.0)
MCHC: 33.9 g/dL (ref 30.0–36.0)
MCV: 91.3 fl (ref 78.0–100.0)
Monocytes Absolute: 0.5 10*3/uL (ref 0.1–1.0)
Monocytes Relative: 7.8 % (ref 3.0–12.0)
Neutro Abs: 4.1 10*3/uL (ref 1.4–7.7)
Neutrophils Relative %: 62.3 % (ref 43.0–77.0)
Platelets: 204 10*3/uL (ref 150.0–400.0)
RBC: 4.52 Mil/uL (ref 4.22–5.81)
RDW: 14 % (ref 11.5–15.5)
WBC: 6.7 10*3/uL (ref 4.0–10.5)

## 2019-06-19 LAB — COMPREHENSIVE METABOLIC PANEL
ALT: 16 U/L (ref 0–53)
AST: 25 U/L (ref 0–37)
Albumin: 4.4 g/dL (ref 3.5–5.2)
Alkaline Phosphatase: 78 U/L (ref 39–117)
BUN: 15 mg/dL (ref 6–23)
CO2: 30 mEq/L (ref 19–32)
Calcium: 11.2 mg/dL — ABNORMAL HIGH (ref 8.4–10.5)
Chloride: 105 mEq/L (ref 96–112)
Creatinine, Ser: 1.04 mg/dL (ref 0.40–1.50)
GFR: 82.78 mL/min (ref >60.00)
Glucose, Bld: 95 mg/dL (ref 70–99)
Potassium: 4.5 mEq/L (ref 3.5–5.1)
Sodium: 141 mEq/L (ref 135–145)
Total Bilirubin: 0.5 mg/dL (ref 0.2–1.2)
Total Protein: 7.2 g/dL (ref 6.0–8.3)

## 2019-06-19 LAB — LIPID PANEL
Cholesterol: 174 mg/dL (ref 0–200)
HDL: 49.1 mg/dL (ref >39.00)
LDL Cholesterol: 107 mg/dL — ABNORMAL HIGH (ref 0–99)
NonHDL: 124.87
Total CHOL/HDL Ratio: 4
Triglycerides: 91 mg/dL (ref 0.0–149.0)
VLDL: 18.2 mg/dL (ref 0.0–40.0)

## 2019-06-19 MED ORDER — AMLODIPINE BESYLATE 2.5 MG PO TABS: 2.5000 mg | ORAL_TABLET | Freq: Every day | ORAL | 1 refills | Status: DC

## 2019-06-19 NOTE — Progress Notes (Signed)
HPI:  [name] is a 84 y.o.male here today for his routine physical examination.  Last CPE: 12/05/2017. Last AWV: 04/06/17 He lives with his wife.    Independent ADL's and IADL's. No falls in the past year and denies depression symptoms.  Functional Status Survey: Is the patient deaf or have difficulty hearing?: No Does the patient have difficulty seeing, even when wearing glasses/contacts?: No Does the patient have difficulty concentrating, remembering, or making decisions?: No Does the patient have difficulty walking or climbing stairs?: No Does the patient have difficulty dressing or bathing?: No Does the patient have difficulty doing errands alone such as visiting a doctor's office or shopping?: No  Fall Risk  06/19/2019 04/06/2017 12/06/2016 09/10/2015 08/29/2014  Falls in the past year? 0 No No No No  Number falls in past yr: 0 - - - -  Injury with Fall? 0 - - - -  Follow up Falls evaluation completed - - - -     Providers he sees regularly: Urologist, Dr [name], last OV 02/2019.  Depression screen PHQ 2/9 06/19/2019  Decreased Interest 0  Down, Depressed, Hopeless 0  PHQ - 2 Score 0      Hearing Screening   125Hz  250Hz  500Hz  1000Hz  2000Hz  3000Hz  4000Hz  6000Hz  8000Hz   Right ear:           Left ear:             Visual Acuity Screening   Right eye Left eye Both eyes  Without correction: 20/20 20/40 20/20   With correction:       Regular exercise 3 or more times per week: Walking 2-3 times per week for 30 min. Following a healthy diet: Yes.His wife cooks.   Chronic medical problems: HTN,allergic rhinitis,OA,ED,HLD,and nephrolithiasis. Last episode of nephrolithiasis in 11/2018: + gross hematuria,he follows with urologist,last visit 02/2019. Hx of elevated PSA.   Immunization History  Administered Date(s) Administered  . Influenza Whole 10/14/2004, 08/18/2007, 08/28/2008, 09/08/2009, 08/20/2010  . Influenza, High Dose Seasonal PF 08/27/2013,  09/10/2015, 12/06/2016, 08/25/2017, 09/18/2018  . Influenza,inj,Quad PF,6+ Mos 08/29/2014  . Pneumococcal Conjugate-13 08/29/2014  . Pneumococcal Polysaccharide-23 02/18/2005  . Td 11/15/1998, 05/13/2008  . Zoster 06/03/2009    Colonoscopy in 11/2017 due to GI bleed. -Denies high alcohol intake, tobacco use, or Hx of illicit drug use.  -Concerns and/or follow up today:   HTN, he is on Lisinopril 20 mg daily. Sometimes he takes an extra Lisinopril because elevated BP. Home BP's 155/70,155/78,and 137/72 for the past 3-4 days.He has not checked BP regularly.  Denies severe/frequent headache, visual changes, chest pain, dyspnea, focal weakness, or edema.  HLD, he is non pharmacologic treatment. Last FLP 11/2017. TC 169,HDL 42,LDL 108,and TG 94.  Asthma and allergies,he is on Singulair 10 mg daily and Flovent 44 mcg 2 puff bid. He has not needed Albuterol inh in years. Asymptomatic.   Review of Systems  Constitutional: Negative for activity change, appetite change, chills, fatigue and fever.  HENT: Negative for congestion, mouth sores, nosebleeds, sore throat and trouble swallowing.   Eyes: Negative for redness and visual disturbance.  Respiratory: Negative for cough, shortness of breath and wheezing.   Cardiovascular: Negative for palpitations.  Gastrointestinal: Negative for abdominal pain, nausea and vomiting.  Endocrine: Negative for polydipsia, polyphagia and polyuria.  Genitourinary: Negative for decreased urine volume, dysuria and hematuria.  Musculoskeletal: Positive for arthralgias. Negative for gait problem and myalgias.  Skin: Negative for rash.  Allergic/Immunologic: Positive for environmental allergies.  Neurological: Negative for syncope, weakness and headaches.  Psychiatric/Behavioral: Negative for confusion. The patient is not nervous/anxious.     Current Outpatient Medications on File Prior to Visit  Medication Sig Dispense Refill  . albuterol (PROVENTIL  HFA;VENTOLIN HFA) 108 (90 Base) MCG/ACT inhaler Inhale 2 puffs into the lungs every 6 (six) hours as needed for wheezing or shortness of breath. 1 Inhaler 2  . aspirin 81 MG EC tablet Take 81 mg by mouth every morning.     . calcium carbonate (TUMS - DOSED IN MG ELEMENTAL CALCIUM) 500 MG chewable tablet Chew 1 tablet by mouth as needed for indigestion or heartburn. Reported on 11/19/2015    . fluticasone (FLOVENT HFA) 44 MCG/ACT inhaler INHALE 1 PUFF BY MOUTH INTO THE LUNGS TWICE DAILY 3 Inhaler 3  . hydrocortisone (ANUSOL-HC) 25 MG suppository Place 1 suppository (25 mg total) rectally 2 (two) times daily. 12 suppository 0  . ibuprofen (ADVIL,MOTRIN) 200 MG tablet Take 200 mg by mouth every 6 (six) hours as needed for fever or moderate pain.     Marland Kitchen. lisinopril (ZESTRIL) 20 MG tablet Take 1 tablet (20 mg total) by mouth daily. 30 tablet 0  . montelukast (SINGULAIR) 10 MG tablet Take 1 tablet (10 mg total) by mouth at bedtime. 30 tablet 3  . Multiple Vitamin (MULTIVITAMIN) tablet Take 1 tablet by mouth every morning. Reported on 11/19/2015    . Omega-3 Fatty Acids (FISH OIL) 1200 MG CAPS Take 1,200 mg by mouth every morning.     . polyvinyl alcohol (LIQUIFILM TEARS) 1.4 % ophthalmic solution Place 1 drop into both eyes daily as needed for dry eyes. Reported on 11/19/2015     No current facility-administered medications on file prior to visit.      Past Medical History:  Diagnosis Date  . Allergy    SEASONAL  . Arthritis   . Asthma   . BPH (benign prostatic hypertrophy)   . GERD (gastroesophageal reflux disease)   . History of kidney stones   . History of myasthenia gravis    15 yrs ago - no current problem or treatment  . Hypertension   . Mild carotid artery disease (HCC)    bilateral ICA 0-39%  per duplex 02-16-2013  . Multiple pulmonary nodules    per last CT stable  . Nephrolithiasis    BILATERAL    Past Surgical History:  Procedure Laterality Date  . CATARACT EXTRACTION W/  INTRAOCULAR LENS  IMPLANT, BILATERAL  2007  . COLONOSCOPY    . COLONOSCOPY W/ POLYPECTOMY  05-14-2010  . CYSTO/  LEFT RETROGRADE PYELOGRAM/  LEFT URETEROSCOPY/  LEFT STENT PLACEMENT  06-17-2008  . CYSTOSCOPY W/ RETROGRADES Bilateral 03/24/2015   Procedure: CYSTOSCOPY WITH RETROGRADE PYELOGRAM;  Surgeon: Ihor GullyMark Ottelin, MD;  [facility] [place];  Service: Urology;  Laterality: Bilateral;  . CYSTOSCOPY WITH STENT PLACEMENT Right 03/24/2015   Procedure: CYSTOSCOPY WITH STENT PLACEMENT;  Surgeon: Ihor GullyMark Ottelin, MD;  [facility] [place];  Service: Urology;  Laterality: Right;  . CYSTOSCOPY WITH URETEROSCOPY Right 03/24/2015   Procedure: CYSTOSCOPY WITH URETEROSCOPY;  Surgeon: Ihor GullyMark Ottelin, MD;  [facility] [place];  Service: Urology;  Laterality: Right;  . EXTRACORPOREAL SHOCK WAVE LITHOTRIPSY Bilateral left 02-23-2010 //   right 09-28-2010  . HOLMIUM LASER APPLICATION Right 03/24/2015   Procedure: HOLMIUM LASER APPLICATION;  Surgeon: Ihor GullyMark Ottelin, MD;  [facility] [place];  Service: Urology;  Laterality: Right;  . INGUINAL HERNIA REPAIR Bilateral 2002 approx.  .Marland Kitchen  NEPHROLITHOTOMY Left 05/30/2015   Procedure: LEFT PERCUTANEOUS NEPHROLITHOTOMY;  Surgeon: Ihor GullyMark Ottelin, MD;  Location: [facility];  Service: Urology;  Laterality: Left;  . POLYPECTOMY    . STONE EXTRACTION WITH BASKET Right 03/24/2015   Procedure: STONE EXTRACTION WITH BASKET;  Surgeon: Ihor GullyMark Ottelin, MD;  [facility] [place];  Service: Urology;  Laterality: Right;    No Known Allergies  Family History  Problem Relation Age of Onset  . Heart disease Mother   . Hypertension Mother   . Kidney disease Mother   . Heart disease Father   . Hypertension Father   . Diabetes Sister   . Diabetes Sister   . Hypercalcemia Neg Hx     Social History   Socioeconomic History  . Marital status: Married    Spouse name: Not on file  . Number of children: Not on file  . Years  of education: Not on file  . Highest education level: Not on file  Occupational History  . Not on file  Social Needs  . Financial resource strain: Not on file  . Food insecurity    Worry: Not on file    Inability: Not on file  . Transportation needs    Medical: Not on file    Non-medical: Not on file  Tobacco Use  . Smoking status: Never Smoker  . Smokeless tobacco: Never Used  Substance and Sexual Activity  . Alcohol use: No    Alcohol/week: 0.0 standard drinks  . Drug use: No  . Sexual activity: Not on file  Lifestyle  . Physical activity    Days per week: Not on file    Minutes per session: Not on file  . Stress: Not on file  Relationships  . Social Musicianconnections    Talks on phone: Not on file    Gets together: Not on file    Attends religious service: Not on file    Active member of club or organization: Not on file    Attends meetings of clubs or organizations: Not on file    Relationship status: Not on file  Other Topics Concern  . Not on file  Social History Narrative  . Not on file     Vitals:   06/19/19 0835  BP: (!) 140/92  Pulse: 67  Resp: 12  Temp: 98.3 F (36.8 C)  SpO2: 98%   Body mass index is 25.71 kg/m.   Wt Readings from Last 3 Encounters:  06/19/19 149 lb 12.8 oz (67.9 kg)  09/18/18 154 lb 6 oz (70 kg)  12/13/17 156 lb (70.8 kg)    Physical Exam  Nursing note and vitals reviewed. Constitutional: He is oriented to person, place, and time. He appears well-developed and well-nourished. No distress.  HENT:  Head: Normocephalic and atraumatic.  Mouth/Throat: Oropharynx is clear and moist and mucous membranes are normal.  Eyes: Pupils are equal, round, and reactive to light. Conjunctivae are normal.  Neck: No tracheal deviation present.    Cardiovascular: Normal rate and regular rhythm.  No murmur heard. Pulses:      Dorsalis pedis pulses are 2+ on the right side and 2+ on the left side.  Respiratory: Effort normal and breath sounds  normal. No respiratory distress.  GI: Soft. He exhibits no mass. There is no hepatomegaly. There is no abdominal tenderness.  Musculoskeletal:        General: No edema.  Lymphadenopathy:       Head (right side): Submandibular adenopathy present.  He has no cervical adenopathy.  Neurological: He is alert and oriented to person, place, and time. He has normal strength. No cranial nerve deficit. Gait normal.  Skin: Skin is warm. No rash noted. No erythema.  Psychiatric: He has a normal mood and affect. Cognition and memory are normal.  Well groomed, good eye contact.     ASSESSMENT AND PLAN:  [name] was seen today for medicare wellness and follow-up.  Diagnoses and all orders for this visit:  Orders Placed This Encounter  Procedures  . CT Soft Tissue Neck W Contrast  . Comprehensive metabolic panel  . Lipid panel  . CBC with Differential/Platelet   Lab Results  Component Value Date   WBC 6.7 06/19/2019   HGB 14.0 06/19/2019   HCT 41.3 06/19/2019   MCV 91.3 06/19/2019   PLT 204.0 06/19/2019   Lab Results  Component Value Date   CHOL 174 06/19/2019   HDL 49.10 06/19/2019   LDLCALC 107 (H) 06/19/2019   LDLDIRECT 140.4 05/10/2007   TRIG 91.0 06/19/2019   CHOLHDL 4 06/19/2019   Lab Results  Component Value Date   ALT 16 06/19/2019   AST 25 06/19/2019   ALKPHOS 78 06/19/2019   BILITOT 0.5 06/19/2019   Lab Results  Component Value Date   CREATININE 1.04 06/19/2019   BUN 15 06/19/2019   NA 141 06/19/2019   K 4.5 06/19/2019   CL 105 06/19/2019   CO2 30 06/19/2019     Medicare annual wellness visit, subsequent We discussed the importance of staying active, physically and mentally, as well as the benefits of a healthy/balnace diet. Low impact exercise that involve stretching and strengthing are ideal. Vaccines up to date. Influenza vaccine in 07/2019. We discussed preventive screening for the next 5-10 years, summery of recommendations given in AVS:   Fall  prevention. PSA 06/27/18 normal at 3.9. Colonoscopy 12/13/17.  Health Maintenance  Topic Date Due  . Flu Shot  06/16/2019  . Pneumonia vaccines  Completed  . Tetanus Vaccine  Discontinued    Advance directives and end of life discussed, he does not have health POA, strongly recommended. Information provide on AVS.   Essential hypertension Not well controlled. Possible complications of elevated BP discussed. Amlodipine 2.5 mg added. Continue Lisinopril 20 mg daily. Continue monitoring BP regularly. F/U in 3 months. Annual eye examination. Instructed about warning signs.  -     amLODipine (NORVASC) 2.5 MG tablet; Take 1 tablet (2.5 mg total) by mouth at bedtime. -     Comprehensive metabolic panel  Dyslipidemia (high LDL; low HDL) Mild. Continue non pharmacologic treatment. Further recommendations according to lipid panel results.  -     Comprehensive metabolic panel -     Lipid panel  Enlargement of submandibular gland Possible etiologies discussed. He would like to have further work up.  -     CBC with Differential/Platelet -     CT Soft Tissue Neck W Contrast; Future  Mild intermittent asthma without complication Well controlled. Continue Flovent and Singulair. F/U in a year.  Localized swelling, mass or lump of neck -     CT Soft Tissue Neck W Contrast; Future    Return in about 3 months (around 09/19/2019).    [name]. [name], MD  [facility]. [place] office.

## 2019-06-19 NOTE — Patient Instructions (Addendum)
  [name] , Thank you for taking time to come for your Medicare Wellness Visit. I appreciate your ongoing commitment to your health goals. Please review the following plan we discussed and let me know if I can assist you in the future.   These are the goals we discussed: Goals    . Exercise 150 minutes per week (moderate activity)     Get the punching bag back out; start slow        This is a list of the screening recommended for you and due dates:  Health Maintenance  Topic Date Due  . Flu Shot  06/16/2019  . Pneumonia vaccines  Completed  . Tetanus Vaccine  Discontinued    A few tips:  -As we age balance is not as good as it was, so there is a higher risks for falls. Please remove small rugs and furniture that is "in your way" and could increase the risk of falls. Stretching exercises may help with fall prevention: Yoga and Tai Chi are some examples. Low impact exercise is better, so you are not very achy the next day.  -Sun screen and avoidance of direct sun light recommended. Caution with dehydration, if working outdoors be sure to drink enough fluids.  - Some medications are not safe as we age, increases the risk of side effects and can potentially interact with other medication you are also taken;  including some of over the counter medications. Be sure to let me know when you start a new medication even if it is a dietary/vitamin supplement.   -Healthy diet low in red meet/animal fat and sugar + regular physical activity is recommended.    A few things to remember from today's visit:   Essential hypertension - Plan: amLODipine (NORVASC) 2.5 MG tablet, Comprehensive metabolic panel  Dyslipidemia (high LDL; low HDL) - Plan: Comprehensive metabolic panel, Lipid panel  Enlargement of submandibular gland - Plan: CBC with Differential/Platelet  Mild intermittent asthma without complication   Please be sure medication list is accurate. If a new problem present,  please set up appointment sooner than planned today.    Today amlodipine 2.5 mg at bedtime was added for blood pressure. Continue monitoring blood pressure regularly.   Screening schedule for the next 5-10 years:  Colonoscopy Influenza vaccine annually. Glaucoma screening/eye exam every 1-2 years. Fall prevention   Advance directives:  Please see a lawyer and/or go to this website to help you with advanced directives and designating a health care power of attorney so that your wishes will be followed should you become too ill to make your own medical decisions.  [email or web address]

## 2019-06-23 ENCOUNTER — Encounter: Payer: Self-pay | Admitting: Family Medicine

## 2019-07-04 ENCOUNTER — Other Ambulatory Visit: Payer: Self-pay | Admitting: Family Medicine

## 2019-07-05 ENCOUNTER — Inpatient Hospital Stay: Admission: RE | Admit: 2019-07-05 | Payer: Medicare Other | Source: Ambulatory Visit

## 2019-07-10 ENCOUNTER — Other Ambulatory Visit: Payer: Self-pay | Admitting: *Deleted

## 2019-07-10 DIAGNOSIS — J4520 Mild intermittent asthma, uncomplicated: Secondary | ICD-10-CM

## 2019-07-10 MED ORDER — MONTELUKAST SODIUM 10 MG PO TABS: ORAL_TABLET | ORAL | 3 refills | Status: DC

## 2019-07-10 MED ORDER — FLOVENT HFA 44 MCG/ACT IN AERO: INHALATION_SPRAY | RESPIRATORY_TRACT | 3 refills | Status: DC

## 2019-07-13 ENCOUNTER — Ambulatory Visit
Admission: RE | Admit: 2019-07-13 | Discharge: 2019-07-13 | Disposition: A | Discharge status: Home / Self Care | Payer: Medicare Other | Source: Ambulatory Visit | Attending: Family Medicine | Admitting: Family Medicine

## 2019-07-13 DIAGNOSIS — K111 Hypertrophy of salivary gland: Secondary | ICD-10-CM

## 2019-07-13 DIAGNOSIS — R221 Localized swelling, mass and lump, neck: Secondary | ICD-10-CM

## 2019-07-13 DIAGNOSIS — E042 Nontoxic multinodular goiter: Secondary | ICD-10-CM | POA: Diagnosis not present

## 2019-07-13 MED ORDER — IOPAMIDOL (ISOVUE-300) INJECTION 61%
75.0000 mL | Freq: Once | INTRAVENOUS | Status: AC | PRN
  Administered 2019-07-13: 75 mL via INTRAVENOUS

## 2019-10-02 ENCOUNTER — Ambulatory Visit (INDEPENDENT_AMBULATORY_CARE_PROVIDER_SITE_OTHER): Payer: Medicare Other

## 2019-10-02 ENCOUNTER — Other Ambulatory Visit: Payer: Self-pay

## 2019-10-02 DIAGNOSIS — Z23 Encounter for immunization: Secondary | ICD-10-CM | POA: Diagnosis not present

## 2019-10-19 ENCOUNTER — Other Ambulatory Visit: Payer: Self-pay | Admitting: Family Medicine

## 2019-10-19 DIAGNOSIS — I10 Essential (primary) hypertension: Secondary | ICD-10-CM

## 2019-10-19 MED ORDER — LISINOPRIL 20 MG PO TABS: 20.0000 mg | ORAL_TABLET | Freq: Every day | ORAL | 0 refills | Status: DC

## 2019-10-19 NOTE — Telephone Encounter (Signed)
Medication Refill - Medication: lisinopril (ZESTRIL) 20 MG tablet   Pt needs a 90 day Rx   Has the patient contacted their pharmacy? Yes.   (Agent: If no, request that the patient contact the pharmacy for the refill.) (Agent: If yes, when and what did the pharmacy advise?)  Preferred Pharmacy (with phone number or street name):  [facility] - [address], [place] [address]  Phone: [phone] Fax: [phone]     Agent: Please be advised that RX refills may take up to 3 business days. We ask that you follow-up with your pharmacy.

## 2019-10-25 DIAGNOSIS — Z20828 Contact with and (suspected) exposure to other viral communicable diseases: Secondary | ICD-10-CM | POA: Diagnosis not present

## 2019-11-15 ENCOUNTER — Ambulatory Visit: Payer: Self-pay | Admitting: *Deleted

## 2019-11-15 ENCOUNTER — Other Ambulatory Visit: Payer: Self-pay

## 2019-11-15 ENCOUNTER — Encounter (HOSPITAL_COMMUNITY): Payer: Self-pay | Admitting: Emergency Medicine

## 2019-11-15 ENCOUNTER — Emergency Department (HOSPITAL_COMMUNITY)
Admission: EM | Admit: 2019-11-15 | Discharge: 2019-11-15 | Disposition: A | Discharge status: Home / Self Care | Payer: Medicare Other | Attending: Emergency Medicine | Admitting: Emergency Medicine

## 2019-11-15 DIAGNOSIS — Z7982 Long term (current) use of aspirin: Secondary | ICD-10-CM | POA: Insufficient documentation

## 2019-11-15 DIAGNOSIS — R220 Localized swelling, mass and lump, head: Secondary | ICD-10-CM | POA: Diagnosis present

## 2019-11-15 DIAGNOSIS — Z79899 Other long term (current) drug therapy: Secondary | ICD-10-CM | POA: Insufficient documentation

## 2019-11-15 DIAGNOSIS — T783XXA Angioneurotic edema, initial encounter: Secondary | ICD-10-CM | POA: Diagnosis not present

## 2019-11-15 DIAGNOSIS — I10 Essential (primary) hypertension: Secondary | ICD-10-CM | POA: Insufficient documentation

## 2019-11-15 MED ORDER — DEXAMETHASONE SODIUM PHOSPHATE 10 MG/ML IJ SOLN
10.0000 mg | Freq: Once | INTRAMUSCULAR | Status: AC
  Administered 2019-11-15: 10 mg via INTRAVENOUS
  Filled 2019-11-15: qty 1

## 2019-11-15 MED ORDER — FAMOTIDINE IN NACL 20-0.9 MG/50ML-% IV SOLN
20.0000 mg | Freq: Once | INTRAVENOUS | Status: AC
  Administered 2019-11-15: 13:00:00 20 mg via INTRAVENOUS
  Filled 2019-11-15: qty 50

## 2019-11-15 NOTE — ED Provider Notes (Signed)
[facility]-EMERGENCY DEPT Provider Note   CSN: [id number] Arrival date & time: 11/15/19  1228     History Chief Complaint  Patient presents with  . Allergic Reaction    Anselm Lisdward L Stripling is a 84 y.o. male presenting for evaluation of lip swelling.  Patient states yesterday he had his dentures refitted.  About 6 hours afterwards, he started to notice a small bump in the middle of his upper lip.  Since then, he has had swelling extending to his entire upper lip and a little bit to his bottom lip.  He took one Benadryl last night, and 225 mg Benadryl this morning at 10 am.  He states that Benadryl may have helped a little bit.  He denies tongue or throat swelling.  He denies difficulty breathing or difficulty swallowing.  No chest pain, shortness breath, nausea, vomiting, abdominal pain.  No rash or urticaria.  He denies previous allergy or angioedema.  He has had the same dental procedure done before without reaction.  He is not on any antibiotics.  He takes lisinopril and amlodipine for blood pressure, has been on lisinopril for years.  Initial history obtained from chart review.  Patient with a history of seasonal allergies, arthritis, asthma, BPH, GERD, hypertension.   HPI     Past Medical History:  Diagnosis Date  . Allergy    SEASONAL  . Arthritis   . Asthma   . BPH (benign prostatic hypertrophy)   . GERD (gastroesophageal reflux disease)   . History of kidney stones   . History of myasthenia gravis    15 yrs ago - no current problem or treatment  . Hypertension   . Mild carotid artery disease (HCC)    bilateral ICA 0-39%  per duplex 02-16-2013  . Multiple pulmonary nodules    per last CT stable  . Nephrolithiasis    BILATERAL    Patient Active Problem List   Diagnosis Date Noted  . Dyslipidemia (high LDL; low HDL) 06/18/2019  . Routine general medical examination at a health care facility 12/05/2017  . Hypercalcemia 11/19/2015  . Family  history of prostate cancer 09/10/2015  . Recurrent kidney stones 03/18/2015  . History of multiple pulmonary nodules 04/10/2014  . ERECTILE DYSFUNCTION, ORGANIC 05/16/2009  . Osteoarthritis, multiple sites 11/20/2008  . MIGRAINE, OPHTHALMIC 03/21/2007  . MYASTHENIA GRAVIS W/O (ACUTE) EXACERBATION 03/21/2007  . Essential hypertension 03/21/2007  . Allergic rhinitis 03/21/2007  . Asthma 03/21/2007  . GERD 03/21/2007  . HERNIA 03/21/2007    Past Surgical History:  Procedure Laterality Date  . CATARACT EXTRACTION W/ INTRAOCULAR LENS  IMPLANT, BILATERAL  2007  . COLONOSCOPY    . COLONOSCOPY W/ POLYPECTOMY  05-14-2010  . CYSTO/  LEFT RETROGRADE PYELOGRAM/  LEFT URETEROSCOPY/  LEFT STENT PLACEMENT  06-17-2008  . CYSTOSCOPY W/ RETROGRADES Bilateral 03/24/2015   Procedure: CYSTOSCOPY WITH RETROGRADE PYELOGRAM;  Surgeon: Ihor GullyMark Ottelin, MD;  [facility];  Service: Urology;  Laterality: Bilateral;  . CYSTOSCOPY WITH STENT PLACEMENT Right 03/24/2015   Procedure: CYSTOSCOPY WITH STENT PLACEMENT;  Surgeon: Ihor GullyMark Ottelin, MD;  [facility];  Service: Urology;  Laterality: Right;  . CYSTOSCOPY WITH URETEROSCOPY Right 03/24/2015   Procedure: CYSTOSCOPY WITH URETEROSCOPY;  Surgeon: Ihor GullyMark Ottelin, MD;  [facility];  Service: Urology;  Laterality: Right;  . EXTRACORPOREAL SHOCK WAVE LITHOTRIPSY Bilateral left 02-23-2010 //   right 09-28-2010  . HOLMIUM LASER APPLICATION Right 03/24/2015   Procedure: HOLMIUM LASER APPLICATION;  Surgeon: Ihor Gully, MD;  [facility];  Service: Urology;  Laterality: Right;  . INGUINAL HERNIA REPAIR Bilateral 2002 approx.  Marland Kitchen NEPHROLITHOTOMY Left 05/30/2015   Procedure: LEFT PERCUTANEOUS NEPHROLITHOTOMY;  Surgeon: Ihor Gully, MD;  Location: [facility];  Service: Urology;  Laterality: Left;  . POLYPECTOMY    . STONE EXTRACTION WITH BASKET Right 03/24/2015   Procedure: STONE EXTRACTION WITH  BASKET;  Surgeon: Ihor Gully, MD;  Location[facility];  Service: Urology;  Laterality: Right;       Family History  Problem Relation Age of Onset  . Heart disease Mother   . Hypertension Mother   . Kidney disease Mother   . Heart disease Father   . Hypertension Father   . Diabetes Sister   . Diabetes Sister   . Hypercalcemia Neg Hx     Social History   Tobacco Use  . Smoking status: Never Smoker  . Smokeless tobacco: Never Used  Substance Use Topics  . Alcohol use: No    Alcohol/week: 0.0 standard drinks  . Drug use: No    Home Medications Prior to Admission medications   Medication Sig Start Date End Date Taking? Authorizing Provider  albuterol (PROVENTIL HFA;VENTOLIN HFA) 108 (90 Base) MCG/ACT inhaler Inhale 2 puffs into the lungs every 6 (six) hours as needed for wheezing or shortness of breath. 02/26/19   [place], [name], MD  amLODipine (NORVASC) 2.5 MG tablet Take 1 tablet (2.5 mg total) by mouth at bedtime. 06/19/19   [place], [name], MD  aspirin 81 MG EC tablet Take 81 mg by mouth every morning.     [provider]  calcium carbonate (TUMS - DOSED IN MG ELEMENTAL CALCIUM) 500 MG chewable tablet Chew 1 tablet by mouth as needed for indigestion or heartburn. Reported on 11/19/2015    [provider]  fluticasone (FLOVENT HFA) 44 MCG/ACT inhaler INHALE 1 PUFF BY MOUTH INTO THE LUNGS TWICE DAILY 07/10/19   [place], [name], MD  hydrocortisone (ANUSOL-HC) 25 MG suppository Place 1 suppository (25 mg total) rectally 2 (two) times daily. 09/13/17   Nafziger, Kandee Keen, NP  ibuprofen (ADVIL,MOTRIN) 200 MG tablet Take 200 mg by mouth every 6 (six) hours as needed for fever or moderate pain.     [provider]  lisinopril (ZESTRIL) 20 MG tablet Take 1 tablet (20 mg total) by mouth daily. 10/19/19   [place], [name], MD  montelukast (SINGULAIR) 10 MG tablet TAKE 1 TABLET BY MOUTH EVERY NIGHT AT BEDTIME GENERIC EQUIVALENT FOR SINGULAIR 07/10/19    [place], [name], MD  Multiple Vitamin (MULTIVITAMIN) tablet Take 1 tablet by mouth every morning. Reported on 11/19/2015    [provider]  Omega-3 Fatty Acids (FISH OIL) 1200 MG CAPS Take 1,200 mg by mouth every morning.     [provider]  polyvinyl alcohol (LIQUIFILM TEARS) 1.4 % ophthalmic solution Place 1 drop into both eyes daily as needed for dry eyes. Reported on 11/19/2015    [provider]    Allergies    Patient has no known allergies.  Review of Systems   Review of Systems  HENT:       Lip swelling  All other systems reviewed and are negative.   Physical Exam Updated Vital Signs BP (!) 182/89   Pulse 95   Temp 98 F (36.7 C) (Oral)   Resp 17   SpO2 99%   Physical Exam Vitals and nursing note reviewed.  Constitutional:  General: He is not in acute distress.    Appearance: He is well-developed.     Comments: Sitting comfortably in the chair no acute distress  HENT:     Head: Normocephalic and atraumatic.     Comments: Mild upper lip swelling, no appreciable lower lip swelling.  No tongue swelling.  Airway intact.  Handling secretions easily. Eyes:     Extraocular Movements: Extraocular movements intact.     Conjunctiva/sclera: Conjunctivae normal.     Pupils: Pupils are equal, round, and reactive to light.  Cardiovascular:     Rate and Rhythm: Normal rate and regular rhythm.     Pulses: Normal pulses.  Pulmonary:     Effort: Pulmonary effort is normal. No respiratory distress.     Breath sounds: Normal breath sounds. No wheezing.     Comments: Speaking in full sentences.  Clear lung sounds in all fields. Abdominal:     General: There is no distension.     Palpations: Abdomen is soft. There is no mass.     Tenderness: There is no abdominal tenderness. There is no guarding or rebound.  Musculoskeletal:        General: Normal range of motion.     Cervical back: Normal range of motion and neck supple.  Skin:    General: Skin is  warm and dry.     Capillary Refill: Capillary refill takes less than 2 seconds.  Neurological:     Mental Status: He is alert and oriented to person, place, and time.     ED Results / Procedures / Treatments   Labs (all labs ordered are listed, but only abnormal results are displayed) Labs Reviewed - No data to display  EKG None  Radiology No results found.  Procedures Procedures (including critical care time)  Medications Ordered in ED Medications  famotidine (PEPCID) IVPB 20 mg premix (20 mg Intravenous New Bag/Given 11/15/19 1313)  dexamethasone (DECADRON) injection 10 mg (10 mg Intravenous Given 11/15/19 1309)    ED Course  I have reviewed the triage vital signs and the nursing notes.  Pertinent labs & imaging results that were available during my care of the patient were reviewed by me and considered in my medical decision making (see chart for details).    MDM Rules/Calculators/A&P                      Patient presenting for evaluation of angioedema.  Physical exam shows patient appears nontoxic.  He has mild upper lip swelling, no significant swelling of the lower lip.  Airway is intact.  Only 1 system involvement, no anaphylaxis.  Will treat with Decadron and Pepcid.  Will hold on epinephrine.  Patient already took Benadryl, will hold on further Benadryl.  While patient did recently have a dental procedure, considering the isolated angioedema, higher suspicion for lisinopril induced angioedema.  Case discussed with attending, Dr. Silverio Lay evaluated the patient.  On reassessment, patient reports improvement of lip swelling.  No further worsening of respiratory status.  Discussed discontinuing the lisinopril and increasing the amlodipine.  Discussed importance of close follow-up with his primary care doctor.  Discussed strict monitoring of symptoms, return if any worsening.  Discussed use of Benadryl as needed for lip swelling or symptom control.  At this time, patient appears  safe for discharge.  Return precautions given.  Patient states he understands and agrees to plan.  Final Clinical Impression(s) / ED Diagnoses Final diagnoses:  Angioedema, initial encounter  Rx / DC Orders ED Discharge Orders    None       Franchot Heidelberg, PA-C 11/15/19 1442    Drenda Freeze, MD 11/15/19 [phone]

## 2019-11-15 NOTE — ED Triage Notes (Signed)
Pt reports used a denture repair kit yesterday then several hours later started having lip and cheek swelling. Took Benadryl last night around 10p. Took 2 benadryl this morning around 7am. Denies tongue swelling or trouble swallowing or breathing.

## 2019-11-15 NOTE — Telephone Encounter (Signed)
Pt called stating he thinks he is having an allergic reaction from a repair kit for dentures (11/14/2019 at 1000); he says he noticed a bump on after using the medication at 1100 11/04/2019, and within 1 hr his upper lip and both cheeks was swollen R>L; he took 1 benadryl at MN, and 2 pills at 0730 this morning;  Recommendations made per nurse triage protocol; he verbalized understanding; the pt sees Dr [name], Aviva Kluver; will route to office for notification. Reason for Disposition . [1] Widespread hives, itching or facial swelling AND [2] onset < 2 hours of exposure to high-risk allergen (e.g., sting, nuts, 1st dose of antibiotic)  Answer Assessment - Initial Assessment Questions 1. MAIN SYMPTOM: "What is your main symptom?" "How bad is it?"      Facial swelling; moderate to severe  2. RESPIRATORY STATUS: "Are you having difficulty breathing?"  (e.g., yes/no, wheezing, unable to complete a sentence)      No problems breathing 3. SWALLOWING: "Can you swallow?" (e.g., yes/no, food, fluid, saliva)      no 4. VASCULAR STATUS: "Are you feeling weak?" If so, ask: "Can you stand and walk normally?"    no 5. ONSET: "When did the reaction start?" (Minutes or hours ago)     11/15/2019 at 1100 6. SUBSTANCE: "What are you reacting to?" "When did the contact occur?"     Methyacrylates 7. PREVIOUS REACTION: "Have you ever reacted to it before?" If so, ask: "What happened that time?"     no 8. EPINEPHRINE: "Do you have an epinephrine autoinjector (e.g., EpiPen)?"     no  Protocols used: ANAPHYLAXIS-A-AH

## 2019-11-15 NOTE — Discharge Instructions (Signed)
Stop taking your lisinopril. Take 5 mg of your amlodipine at a time (2 pills).  Follow-up with your primary care doctor next week for recheck of your blood pressure and recheck of your symptoms. Use Benadryl every 6 hours as needed for lip swelling. Return to the emergency room immediately if you have any worsening symptoms, especially with tongue swelling, throat swelling, or difficulty breathing.

## 2019-11-23 ENCOUNTER — Other Ambulatory Visit: Payer: Self-pay

## 2019-11-26 ENCOUNTER — Ambulatory Visit: Payer: Medicare Other | Admitting: Family Medicine

## 2019-11-26 ENCOUNTER — Encounter: Payer: Self-pay | Admitting: Family Medicine

## 2019-11-26 VITALS — BP 160/98 | HR 100 | Temp 96.0°F | Resp 12 | Ht 64.0 in | Wt 150.0 lb

## 2019-11-26 DIAGNOSIS — I700 Atherosclerosis of aorta: Secondary | ICD-10-CM | POA: Diagnosis not present

## 2019-11-26 DIAGNOSIS — I10 Essential (primary) hypertension: Secondary | ICD-10-CM | POA: Diagnosis not present

## 2019-11-26 DIAGNOSIS — G7000 Myasthenia gravis without (acute) exacerbation: Secondary | ICD-10-CM

## 2019-11-26 DIAGNOSIS — Z87898 Personal history of other specified conditions: Secondary | ICD-10-CM | POA: Diagnosis not present

## 2019-11-26 MED ORDER — AMLODIPINE BESYLATE 5 MG PO TABS: 7.5000 mg | ORAL_TABLET | Freq: Every day | ORAL | 2 refills | Status: DC

## 2019-11-26 NOTE — Assessment & Plan Note (Signed)
Re-checked 160/98. Problem is not well controlled. Possible complications of elevated BP discussed. After discussion of other pharmacologic options,he would like to increase Amlodipine. Amlodipine increased from 5 mg to 7.5 mg. Continue monitoring BP regularly, recommend.  BP monitor next visit. Continue low-salt diet. Instructed about warning signs.

## 2019-11-26 NOTE — Patient Instructions (Signed)
A few things to remember from today's visit:   Essential hypertension - Plan: amLODipine (NORVASC) 5 MG tablet  Myasthenia gravis without exacerbation (HCC), Chronic  Amlodipine increased from 5 mg to 7.5 mg. Continue checking blood pressure.  Please be sure medication list is accurate. If a new problem present, please set up appointment sooner than planned today.

## 2019-11-26 NOTE — Progress Notes (Signed)
HPI:   [name] is a 85 y.o. male, who is here today to follow on recent ER visit. He presented to the ER on 11/15/2019 because of lower lip swelling. Tongue and pharynx were not compromised. Lisinopril 20 mg was discontinued. No prior Hx of angioedema.  BP has been elevated, 150's/90's. When he was on Lisinopril 20 mg his BP was 130's/70's. Amlodipine was increased from 2.5 mg to 5 mg after ER visit. 11/15/19 BP was 182/89.  Lab Results  Component Value Date   CREATININE 1.04 06/19/2019   BUN 15 06/19/2019   NA 141 06/19/2019   K 4.5 06/19/2019   CL 105 06/19/2019   CO2 30 06/19/2019  Denies severe/frequent headache, visual changes, chest pain, dyspnea, palpitation, claudication, focal weakness, or edema.  Myasthenia Gravis: He has not followed with neuro in years because has not had symptoms.  Abdominal CT showed aortic atherosclerosis. Negative for abdominal pain or changes in bowel habits.  Review of Systems  Constitutional: Negative for chills, fatigue and fever.  Respiratory: Negative for cough and wheezing.   Gastrointestinal: Negative for abdominal pain, nausea and vomiting.  Musculoskeletal: Negative for gait problem and myalgias.  Neurological: Negative for tremors, syncope, facial asymmetry, speech difficulty and numbness.  Psychiatric/Behavioral: Negative for confusion. The patient is not nervous/anxious.   Rest see pertinent positives and negatives per HPI.   Current Outpatient Medications on File Prior to Visit  Medication Sig Dispense Refill  . albuterol (PROVENTIL HFA;VENTOLIN HFA) 108 (90 Base) MCG/ACT inhaler Inhale 2 puffs into the lungs every 6 (six) hours as needed for wheezing or shortness of breath. 1 Inhaler 2  . aspirin 81 MG EC tablet Take 81 mg by mouth every morning.     . calcium carbonate (TUMS - DOSED IN MG ELEMENTAL CALCIUM) 500 MG chewable tablet Chew 1 tablet by mouth as needed for indigestion or heartburn. Reported  on 11/19/2015    . fluticasone (FLOVENT HFA) 44 MCG/ACT inhaler INHALE 1 PUFF BY MOUTH INTO THE LUNGS TWICE DAILY 3 Inhaler 3  . hydrocortisone (ANUSOL-HC) 25 MG suppository Place 1 suppository (25 mg total) rectally 2 (two) times daily. 12 suppository 0  . ibuprofen (ADVIL,MOTRIN) 200 MG tablet Take 200 mg by mouth every 6 (six) hours as needed for fever or moderate pain.     . montelukast (SINGULAIR) 10 MG tablet TAKE 1 TABLET BY MOUTH EVERY NIGHT AT BEDTIME GENERIC EQUIVALENT FOR SINGULAIR 90 tablet 3  . Multiple Vitamin (MULTIVITAMIN) tablet Take 1 tablet by mouth every morning. Reported on 11/19/2015    . Omega-3 Fatty Acids (FISH OIL) 1200 MG CAPS Take 1,200 mg by mouth every morning.     . polyvinyl alcohol (LIQUIFILM TEARS) 1.4 % ophthalmic solution Place 1 drop into both eyes daily as needed for dry eyes. Reported on 11/19/2015     No current facility-administered medications on file prior to visit.    Past Medical History:  Diagnosis Date  . Allergy    SEASONAL  . Arthritis   . Asthma   . BPH (benign prostatic hypertrophy)   . GERD (gastroesophageal reflux disease)   . History of kidney stones   . History of myasthenia gravis    15 yrs ago - no current problem or treatment  . Hypertension   . Mild carotid artery disease ([place])    bilateral ICA 0-39%  per duplex 02-16-2013  . Multiple pulmonary nodules    per last CT stable  . Nephrolithiasis  BILATERAL   No Known Allergies  Social History   Socioeconomic History  . Marital status: Married    Spouse name: Not on file  . Number of children: Not on file  . Years of education: Not on file  . Highest education level: Not on file  Occupational History  . Not on file  Tobacco Use  . Smoking status: Never Smoker  . Smokeless tobacco: Never Used  Substance and Sexual Activity  . Alcohol use: No    Alcohol/week: 0.0 standard drinks  . Drug use: No  . Sexual activity: Not on file  Other Topics Concern  . Not on file   Social History Narrative  . Not on file   Social Determinants of Health   Financial Resource Strain:   . Difficulty of Paying Living Expenses: Not on file  Food Insecurity:   . Worried About Programme researcher, broadcasting/film/video in the Last Year: Not on file  . Ran Out of Food in the Last Year: Not on file  Transportation Needs:   . Lack of Transportation (Medical): Not on file  . Lack of Transportation (Non-Medical): Not on file  Physical Activity:   . Days of Exercise per Week: Not on file  . Minutes of Exercise per Session: Not on file  Stress:   . Feeling of Stress : Not on file  Social Connections:   . Frequency of Communication with Friends and Family: Not on file  . Frequency of Social Gatherings with Friends and Family: Not on file  . Attends Religious Services: Not on file  . Active Member of Clubs or Organizations: Not on file  . Attends Banker Meetings: Not on file  . Marital Status: Not on file    Vitals:   11/26/19 1125 11/26/19 1154  BP: 130/80 (!) 160/98  Pulse: (!) 102 100  Resp: 12   Temp: (!) 96 F (35.6 C)   SpO2: 99%    Wt Readings from Last 3 Encounters:  11/26/19 150 lb (68 kg)  06/19/19 149 lb 12.8 oz (67.9 kg)  09/18/18 154 lb 6 oz (70 kg)    Body mass index is 25.75 kg/m.  Physical Exam  Nursing note reviewed. Constitutional: He is oriented to person, place, and time. He appears well-developed and well-nourished. No distress.  HENT:  Head: Normocephalic and atraumatic.  Mouth/Throat: Oropharynx is clear and moist and mucous membranes are normal.  Eyes: Pupils are equal, round, and reactive to light. Conjunctivae are normal.  Cardiovascular: Normal rate and regular rhythm.  No murmur heard. DP pulses present bilateral.  Respiratory: Effort normal and breath sounds normal. No respiratory distress.  GI: Soft. He exhibits no mass. There is no hepatomegaly. There is no abdominal tenderness.  Musculoskeletal:        General: No edema.   Lymphadenopathy:    He has no cervical adenopathy.  Neurological: He is alert and oriented to person, place, and time. He has normal strength. No cranial nerve deficit. Gait normal.  Skin: Skin is warm. No erythema.  Psychiatric: He has a normal mood and affect.  Well groomed, good eye contact.    ASSESSMENT AND PLAN:  [name] was seen today for hospitalization follow-up.  Diagnoses and all orders for this visit:  MYASTHENIA GRAVIS W/O (ACUTE) EXACERBATION He has been asymptomatic for years. Not longer following with neurologist.  Hx of angioedema Some side effects of ACEI's discussed. Episode has resolved. Instructed about warning signs.  Essential hypertension Re-checked 160/98. Problem  is not well controlled. Possible complications of elevated BP discussed. After discussion of other pharmacologic options,he would like to increase Amlodipine. Amlodipine increased from 5 mg to 7.5 mg. Continue monitoring BP regularly, recommend.  BP monitor next visit. Continue low-salt diet. Instructed about warning signs.   Abdominal aortic atherosclerosis (HCC) On Aspirin 81 mg daily. LDL 107. He is not on statin med. Will plan on repeating lipid panel in 06/2020.   Return in about 2 months (around 01/24/2020) for HTN,HLD,and hyperCa++.   Charlett Merkle G. [place], MD  [facility]. [place] office.

## 2019-11-26 NOTE — Assessment & Plan Note (Signed)
He has been asymptomatic for years. Not longer following with neurologist.

## 2019-11-26 NOTE — Assessment & Plan Note (Addendum)
Some side effects of ACEI's discussed. Episode has resolved. Instructed about warning signs.

## 2019-11-26 NOTE — Assessment & Plan Note (Signed)
On Aspirin 81 mg daily. LDL 107. He is not on statin med. Will plan on repeating lipid panel in 06/2020.

## 2020-01-11 ENCOUNTER — Telehealth: Payer: Self-pay | Admitting: Family Medicine

## 2020-01-11 NOTE — Telephone Encounter (Signed)
Pt is having Covid Vaccine on 3/6 and previously had covid in Jan. He wants to make sure that is ok to have Covid Vaccine at this time. Pt would like a call back to confirm.

## 2020-01-11 NOTE — Telephone Encounter (Signed)
I called and spoke with pt. We went over information below & he verbalized understanding. 

## 2020-01-11 NOTE — Telephone Encounter (Signed)
It is ok to go ahead with vaccination. Thanks, BJ

## 2020-01-14 ENCOUNTER — Ambulatory Visit: Payer: Medicare Other

## 2020-01-19 ENCOUNTER — Ambulatory Visit: Payer: Medicare Other | Attending: Internal Medicine

## 2020-01-19 DIAGNOSIS — Z23 Encounter for immunization: Secondary | ICD-10-CM | POA: Insufficient documentation

## 2020-01-19 NOTE — Progress Notes (Signed)
   Covid-19 Vaccination Clinic  Name:  KYDAN SHANHOLTZER    MRN: [id number] DOB: 10/09/1935  01/19/2020  Mr. Gianino was observed post Covid-19 immunization for 15 minutes without incident. He was provided with Vaccine Information Sheet and instruction to access the V-Safe system.   Mr. Lehenbauer was instructed to call 911 with any severe reactions post vaccine: Marland Kitchen Difficulty breathing  . Swelling of face and throat  . A fast heartbeat  . A bad rash all over body  . Dizziness and weakness   Immunizations Administered    Name Date Dose VIS Date Route   Pfizer COVID-19 Vaccine 01/19/2020  4:01 PM 0.3 mL 10/26/2019 Intramuscular   Manufacturer: [employer], [employer]   Lot: [id number]   NDC: [address]-4

## 2020-01-25 ENCOUNTER — Other Ambulatory Visit: Payer: Self-pay

## 2020-01-28 ENCOUNTER — Ambulatory Visit (INDEPENDENT_AMBULATORY_CARE_PROVIDER_SITE_OTHER): Payer: Medicare Other | Admitting: Family Medicine

## 2020-01-28 ENCOUNTER — Encounter: Payer: Self-pay | Admitting: Family Medicine

## 2020-01-28 ENCOUNTER — Other Ambulatory Visit: Payer: Self-pay

## 2020-01-28 DIAGNOSIS — R011 Cardiac murmur, unspecified: Secondary | ICD-10-CM | POA: Insufficient documentation

## 2020-01-28 DIAGNOSIS — I10 Essential (primary) hypertension: Secondary | ICD-10-CM | POA: Diagnosis not present

## 2020-01-28 DIAGNOSIS — R972 Elevated prostate specific antigen [PSA]: Secondary | ICD-10-CM | POA: Diagnosis not present

## 2020-01-28 LAB — BASIC METABOLIC PANEL
BUN: 15 mg/dL (ref 6–23)
CO2: 30 mEq/L (ref 19–32)
Calcium: 11 mg/dL — ABNORMAL HIGH (ref 8.4–10.5)
Chloride: 104 mEq/L (ref 96–112)
Creatinine, Ser: 1.09 mg/dL (ref 0.40–1.50)
GFR: 78.3 mL/min (ref >60.00)
Glucose, Bld: 97 mg/dL (ref 70–99)
Potassium: 3.9 mEq/L (ref 3.5–5.1)
Sodium: 142 mEq/L (ref 135–145)

## 2020-01-28 LAB — VITAMIN D 25 HYDROXY (VIT D DEFICIENCY, FRACTURES): VITD: 39.53 ng/mL (ref 30.00–100.00)

## 2020-01-28 MED ORDER — LOSARTAN POTASSIUM 25 MG PO TABS: 25.0000 mg | ORAL_TABLET | Freq: Every day | ORAL | 2 refills | Status: DC

## 2020-01-28 NOTE — Patient Instructions (Addendum)
A few things to remember from today's visit:  Continue monitoring blood pressure at home. Blood pressure goal for most people is less than 140/90. Some populations (older than 60) the goal is less than 150/90.  Most recent cardiologists' recommendations recommend blood pressure at or less than 130/80. Losartan 25 mg to take daily added today. Continue same dose of Amlodipine and take it at bedtime. Monitor for side effects.  Elevated blood pressure increases the risk of strokes, heart and kidney disease, and eye problems. Regular physical activity and a healthy diet (DASH diet) usually help. Low salt diet. Take medications as instructed.  Caution with some over the counter medications as cold medications, dietary products (for weight loss), and Ibuprofen or Aleve (frequent use);all these medications could cause elevation of blood pressure. STOP Ibuprofen.  ECHO will be arranged.  Please arrange appointment with urologist to follow on prostate.  Please be sure medication list is accurate. If a new problem present, please set up appointment sooner than planned today.

## 2020-01-28 NOTE — Assessment & Plan Note (Signed)
BP still mildly elevated. Losartan 25 mg added today.  We discussed some side effects, which include possibility of angioedema although not as frequent as with ACE inhibitor's. Continue amlodipine 7.5 mg daily. Continue low-salt diet and monitoring BP regularly. Follow-up in 2 months.

## 2020-01-28 NOTE — Assessment & Plan Note (Signed)
Reporting no symptoms. Follows with urologist annually.

## 2020-01-28 NOTE — Progress Notes (Addendum)
HPI:   [name] is a 85 y.o. male, who is here today for chronic disease management.  He was last seen on 11/26/19. No new problems since his last OV.  HTN: Dx'ed 1970's. Home BP's: 150/67,144/67,157/80,188/93.  Lisinopril caused angioedema. Currently he is on Amlodipine 7.5 mg daily. Negative for severe/frequent headache, visual changes, chest pain, dyspnea, palpitation, claudication, focal weakness, or edema.  Lab Results  Component Value Date   CREATININE 1.04 06/19/2019   BUN 15 06/19/2019   NA 141 06/19/2019   K 4.5 06/19/2019   CL 105 06/19/2019   CO2 30 06/19/2019   Ca++ was elevated at 11.2 (10.1 and 10.7). He has not noted changes in bowel habits,ny=umbness,cramps,or MS changes.  Hx of elevated PSA. He follows with urologist annually. Negative for dysuria,gross hematuria,or difficulty with starting urination.  Lab Results  Component Value Date   PSA 6.80 (H) 03/13/2018   PSA 4.83 (H) 12/05/2017   PSA 3.29 12/06/2016   Review of Systems  Constitutional: Negative for activity change, appetite change, fatigue and fever.  HENT: Negative for nosebleeds, sore throat and trouble swallowing.   Respiratory: Negative for cough and wheezing.   Gastrointestinal: Negative for abdominal pain, nausea and vomiting.  Genitourinary: Negative for decreased urine volume, flank pain and testicular pain.  Musculoskeletal: Negative for back pain and gait problem.  Neurological: Negative for dizziness, seizures, weakness and numbness.  Rest of ROS, see pertinent positives sand negatives in HPI  Current Outpatient Medications on File Prior to Visit  Medication Sig Dispense Refill  . albuterol (PROVENTIL HFA;VENTOLIN HFA) 108 (90 Base) MCG/ACT inhaler Inhale 2 puffs into the lungs every 6 (six) hours as needed for wheezing or shortness of breath. 1 Inhaler 2  . amLODipine (NORVASC) 5 MG tablet Take 1.5 tablets (7.5 mg total) by mouth daily. 45 tablet 2  .  aspirin 81 MG EC tablet Take 81 mg by mouth every morning.     . calcium carbonate (TUMS - DOSED IN MG ELEMENTAL CALCIUM) 500 MG chewable tablet Chew 1 tablet by mouth as needed for indigestion or heartburn. Reported on 11/19/2015    . fluticasone (FLOVENT HFA) 44 MCG/ACT inhaler INHALE 1 PUFF BY MOUTH INTO THE LUNGS TWICE DAILY 3 Inhaler 3  . hydrocortisone (ANUSOL-HC) 25 MG suppository Place 1 suppository (25 mg total) rectally 2 (two) times daily. 12 suppository 0  . montelukast (SINGULAIR) 10 MG tablet TAKE 1 TABLET BY MOUTH EVERY NIGHT AT BEDTIME GENERIC EQUIVALENT FOR SINGULAIR 90 tablet 3  . Multiple Vitamin (MULTIVITAMIN) tablet Take 1 tablet by mouth every morning. Reported on 11/19/2015    . Omega-3 Fatty Acids (FISH OIL) 1200 MG CAPS Take 1,200 mg by mouth every morning.     . polyvinyl alcohol (LIQUIFILM TEARS) 1.4 % ophthalmic solution Place 1 drop into both eyes daily as needed for dry eyes. Reported on 11/19/2015     No current facility-administered medications on file prior to visit.   Past Medical History:  Diagnosis Date  . Allergy    SEASONAL  . Arthritis   . Asthma   . BPH (benign prostatic hypertrophy)   . GERD (gastroesophageal reflux disease)   . History of kidney stones   . History of myasthenia gravis    15 yrs ago - no current problem or treatment  . Hypertension   . Mild carotid artery disease (HCC)    bilateral ICA 0-39%  per duplex 02-16-2013  . Multiple pulmonary nodules  per last CT stable  . Nephrolithiasis    BILATERAL   Allergies  Allergen Reactions  . Lisinopril Swelling    Social History   Socioeconomic History  . Marital status: Married    Spouse name: Not on file  . Number of children: Not on file  . Years of education: Not on file  . Highest education level: Not on file  Occupational History  . Not on file  Tobacco Use  . Smoking status: Never Smoker  . Smokeless tobacco: Never Used  Substance and Sexual Activity  . Alcohol use: No     Alcohol/week: 0.0 standard drinks  . Drug use: No  . Sexual activity: Not on file  Other Topics Concern  . Not on file  Social History Narrative  . Not on file   Social Determinants of Health   Financial Resource Strain:   . Difficulty of Paying Living Expenses:   Food Insecurity:   . Worried About Programme researcher, broadcasting/film/video in the Last Year:   . Barista in the Last Year:   Transportation Needs:   . Freight forwarder (Medical):   Marland Kitchen Lack of Transportation (Non-Medical):   Physical Activity:   . Days of Exercise per Week:   . Minutes of Exercise per Session:   Stress:   . Feeling of Stress :   Social Connections:   . Frequency of Communication with Friends and Family:   . Frequency of Social Gatherings with Friends and Family:   . Attends Religious Services:   . Active Member of Clubs or Organizations:   . Attends Banker Meetings:   Marland Kitchen Marital Status:     Vitals:   01/28/20 0750  BP: 130/82  Pulse: 82  Resp: 16  SpO2: 98%   Body mass index is 25.92 kg/m.  Physical Exam  Nursing note and vitals reviewed. Constitutional: He is oriented to person, place, and time. He appears well-developed and well-nourished. No distress.  HENT:  Head: Normocephalic and atraumatic.  Mouth/Throat: Oropharynx is clear and moist and mucous membranes are normal.  Eyes: Pupils are equal, round, and reactive to light. Conjunctivae are normal.  Cardiovascular: Normal rate and regular rhythm.  Murmur (SEM I/VI RUSB) heard. Pulses:      Dorsalis pedis pulses are 2+ on the right side and 2+ on the left side.  Respiratory: Effort normal and breath sounds normal. No respiratory distress.  GI: Soft. He exhibits no mass. There is no hepatomegaly. There is no abdominal tenderness.  Musculoskeletal:        General: No edema.  Lymphadenopathy:    He has no cervical adenopathy.  Neurological: He is alert and oriented to person, place, and time. He has normal strength. No  cranial nerve deficit. Gait normal.  Skin: Skin is warm. No rash noted. No erythema.  Psychiatric: He has a normal mood and affect.  Well groomed, good eye contact.   ASSESSMENT AND PLAN:   [name] was seen today for chronic disease management.  Orders Placed This Encounter  Procedures  . VITAMIN D 25 Hydroxy (Vit-D Deficiency, Fractures)  . Parathyroid hormone, intact (no Ca)  . Basic metabolic panel  . EKG 12-Lead  . ECHOCARDIOGRAM COMPLETE   Lab Results  Component Value Date   CREATININE 1.09 01/28/2020   BUN 15 01/28/2020   NA 142 01/28/2020   K 3.9 01/28/2020   CL 104 01/28/2020   CO2 30 01/28/2020   Lab Results  Component Value Date   PTH 48 01/28/2020   CALCIUM 11.0 (H) 01/28/2020   PHOS 2.4 09/17/2015   Essential hypertension BP still mildly elevated. Losartan 25 mg added today.  We discussed some side effects, which include possibility of angioedema although not as frequent as with ACE inhibitor's. Continue amlodipine 7.5 mg daily. Continue low-salt diet and monitoring BP regularly. Follow-up in 2 months.  Hypercalcemia We discussed possible etiologies, including malignancy given his history of elevated PSA. Further recommendation will be given according to lab results.  Heart murmur I have heard heart murmur before. He does not have symptoms. Echo has been ordered. EKG today:SR, 1rst AV block,ST elevation anterior leads,LAD/LAFB. Compared with EKG on 12/05/17 no significant changes. Instructed about warning signs.  Elevated PSA Reporting no symptoms. Follows with urologist annually.   Return in about 2 months (around 03/29/2020) for HTN.    Ledarius Leeson G. [name], MD  [facility]. [place] office.

## 2020-01-28 NOTE — Assessment & Plan Note (Signed)
We discussed possible etiologies, including malignancy given his history of elevated PSA. Further recommendation will be given according to lab results.

## 2020-01-28 NOTE — Assessment & Plan Note (Signed)
I have heard heart murmur before. He does not have symptoms. Echo has been ordered. EKG today:SR, 1rst AV block,ST elevation anterior leads,LAD/LAFB. Compared with EKG on 12/05/17 no significant changes. Instructed about warning signs.

## 2020-01-29 LAB — PARATHYROID HORMONE, INTACT (NO CA): PTH: 48 pg/mL (ref 14–64)

## 2020-02-19 ENCOUNTER — Ambulatory Visit: Payer: Medicare Other | Attending: Internal Medicine

## 2020-02-19 DIAGNOSIS — Z23 Encounter for immunization: Secondary | ICD-10-CM

## 2020-02-19 NOTE — Progress Notes (Signed)
   Covid-19 Vaccination Clinic  Name:  AFSHIN CHRYSTAL    MRN: [id number] DOB: 05-02-1935  02/19/2020  Mr. Reiswig was observed post Covid-19 immunization for 15 minutes without incident. He was provided with Vaccine Information Sheet and instruction to access the V-Safe system.   Mr. Denz was instructed to call 911 with any severe reactions post vaccine: Marland Kitchen Difficulty breathing  . Swelling of face and throat  . A fast heartbeat  . A bad rash all over body  . Dizziness and weakness   Immunizations Administered    Name Date Dose VIS Date Route   Pfizer COVID-19 Vaccine 02/19/2020  9:28 AM 0.3 mL 10/26/2019 Intramuscular   Manufacturer: [employer], [employer]   Lot: [id number]   NDC: [address]-3

## 2020-02-25 ENCOUNTER — Other Ambulatory Visit: Payer: Self-pay | Admitting: Family Medicine

## 2020-02-25 DIAGNOSIS — I10 Essential (primary) hypertension: Secondary | ICD-10-CM

## 2020-03-31 ENCOUNTER — Encounter: Payer: Self-pay | Admitting: Family Medicine

## 2020-03-31 ENCOUNTER — Ambulatory Visit (INDEPENDENT_AMBULATORY_CARE_PROVIDER_SITE_OTHER): Payer: Medicare Other | Admitting: Family Medicine

## 2020-03-31 ENCOUNTER — Other Ambulatory Visit: Payer: Self-pay

## 2020-03-31 VITALS — BP 140/82 | HR 64 | Temp 97.7°F | Resp 12 | Ht 64.0 in | Wt 149.0 lb

## 2020-03-31 DIAGNOSIS — I10 Essential (primary) hypertension: Secondary | ICD-10-CM

## 2020-03-31 DIAGNOSIS — R011 Cardiac murmur, unspecified: Secondary | ICD-10-CM

## 2020-03-31 MED ORDER — LOSARTAN POTASSIUM 50 MG PO TABS: 50.0000 mg | ORAL_TABLET | Freq: Every day | ORAL | 1 refills | Status: DC

## 2020-03-31 NOTE — Progress Notes (Signed)
HPI:  [name] is a 85 y.o. male, who is here today for 2 months follow up.   He was last seen on 01/28/20,when his BP was elevated. Last visit losartan 25 mg was added. He has had angioedema with Lisinopril,has not had similar problems with Losartan and it seems to be well tolerated. He is also on Amlodipine 5 mg daily.  BP improved but still having elevated BP's at home: 140's/70's.  Denies severe/frequent headache, visual changes, chest pain, dyspnea, palpitation, claudication, focal weakness, or edema.  Echo was ordered a couple months ago, he has not gotten information about test. Heart murmur, he reported no prior Hx. Denies orthopnea or PND. No changes in physical endurance.  Lab Results  Component Value Date   CREATININE 1.09 01/28/2020   BUN 15 01/28/2020   NA 142 01/28/2020   K 3.9 01/28/2020   CL 104 01/28/2020   CO2 30 01/28/2020   Hypercalcemia: Chronic. Reviewing records, he underwent nuclear parathyroid study, negative for parathyroid adenoma. He has not noted abdominal pain,changes in bowel habits,numbness/tingling,or MS changes. He has followed with endocrinologist.  Lab Results  Component Value Date   PTH 48 01/28/2020   CALCIUM 11.0 (H) 01/28/2020   PHOS 2.4 09/17/2015   Review of Systems  Constitutional: Negative for activity change, appetite change, fatigue and fever.  HENT: Negative for mouth sores, nosebleeds and sore throat.   Respiratory: Negative for cough and wheezing.   Gastrointestinal: Negative for nausea and vomiting.  Genitourinary: Negative for decreased urine volume, dysuria and hematuria.  Musculoskeletal: Negative for gait problem and myalgias.  Skin: Negative for pallor and rash.  Neurological: Negative for dizziness, syncope and facial asymmetry.  Psychiatric/Behavioral: Negative for confusion. The patient is not nervous/anxious.   Rest of ROS, see pertinent positives sand negatives in HPI  Current  Outpatient Medications on File Prior to Visit  Medication Sig Dispense Refill  . albuterol (PROVENTIL HFA;VENTOLIN HFA) 108 (90 Base) MCG/ACT inhaler Inhale 2 puffs into the lungs every 6 (six) hours as needed for wheezing or shortness of breath. 1 Inhaler 2  . amLODipine (NORVASC) 5 MG tablet TAKE ONE AND ONE-HALF TABLETS BY MOUTH DAILY GENERIC EQUIVALENT FOR NORVASC 135 tablet 0  . aspirin 81 MG EC tablet Take 81 mg by mouth every morning.     . calcium carbonate (TUMS - DOSED IN MG ELEMENTAL CALCIUM) 500 MG chewable tablet Chew 1 tablet by mouth as needed for indigestion or heartburn. Reported on 11/19/2015    . fluticasone (FLOVENT HFA) 44 MCG/ACT inhaler INHALE 1 PUFF BY MOUTH INTO THE LUNGS TWICE DAILY 3 Inhaler 3  . hydrocortisone (ANUSOL-HC) 25 MG suppository Place 1 suppository (25 mg total) rectally 2 (two) times daily. 12 suppository 0  . montelukast (SINGULAIR) 10 MG tablet TAKE 1 TABLET BY MOUTH EVERY NIGHT AT BEDTIME GENERIC EQUIVALENT FOR SINGULAIR 90 tablet 3  . Multiple Vitamin (MULTIVITAMIN) tablet Take 1 tablet by mouth every morning. Reported on 11/19/2015    . Omega-3 Fatty Acids (FISH OIL) 1200 MG CAPS Take 1,200 mg by mouth every morning.     . polyvinyl alcohol (LIQUIFILM TEARS) 1.4 % ophthalmic solution Place 1 drop into both eyes daily as needed for dry eyes. Reported on 11/19/2015     No current facility-administered medications on file prior to visit.   Past Medical History:  Diagnosis Date  . Allergy    SEASONAL  . Arthritis   . Asthma   . BPH (benign  prostatic hypertrophy)   . GERD (gastroesophageal reflux disease)   . History of kidney stones   . History of myasthenia gravis    15 yrs ago - no current problem or treatment  . Hypertension   . Mild carotid artery disease (HCC)    bilateral ICA 0-39%  per duplex 02-16-2013  . Multiple pulmonary nodules    per last CT stable  . Nephrolithiasis    BILATERAL   Allergies  Allergen Reactions  . Lisinopril Swelling     Social History   Socioeconomic History  . Marital status: Married    Spouse name: Not on file  . Number of children: Not on file  . Years of education: Not on file  . Highest education level: Not on file  Occupational History  . Not on file  Tobacco Use  . Smoking status: Never Smoker  . Smokeless tobacco: Never Used  Substance and Sexual Activity  . Alcohol use: No    Alcohol/week: 0.0 standard drinks  . Drug use: No  . Sexual activity: Not on file  Other Topics Concern  . Not on file  Social History Narrative  . Not on file   Social Determinants of Health   Financial Resource Strain:   . Difficulty of Paying Living Expenses:   Food Insecurity:   . Worried About Programme researcher, broadcasting/film/video in the Last Year:   . Barista in the Last Year:   Transportation Needs:   . Freight forwarder (Medical):   Marland Kitchen Lack of Transportation (Non-Medical):   Physical Activity:   . Days of Exercise per Week:   . Minutes of Exercise per Session:   Stress:   . Feeling of Stress :   Social Connections:   . Frequency of Communication with Friends and Family:   . Frequency of Social Gatherings with Friends and Family:   . Attends Religious Services:   . Active Member of Clubs or Organizations:   . Attends Banker Meetings:   Marland Kitchen Marital Status:    Vitals:   03/31/20 0952  BP: 140/82  Pulse: 64  Resp: 12  Temp: 97.7 F (36.5 C)  SpO2: 99%   Body mass index is 25.58 kg/m.  Physical Exam  Nursing note reviewed. Constitutional: He is oriented to person, place, and time. He appears well-developed. No distress.  HENT:  Head: Normocephalic and atraumatic.  Mouth/Throat: Oropharynx is clear and moist and mucous membranes are normal.  Eyes: Pupils are equal, round, and reactive to light. Conjunctivae are normal.  Cardiovascular: Normal rate and regular rhythm.  Murmur (SEM I-II/VI RUSB and heard some LUSB) heard. Respiratory: Effort normal and breath sounds normal.  No respiratory distress.  GI: Soft. He exhibits no mass. There is no hepatomegaly. There is no abdominal tenderness.  Musculoskeletal:        General: No edema.  Lymphadenopathy:    He has no cervical adenopathy.  Neurological: He is alert and oriented to person, place, and time. He has normal strength. No cranial nerve deficit. Gait normal.  Skin: Skin is warm. No rash noted. No erythema.  Psychiatric: He has a normal mood and affect.  Well groomed, good eye contact.   ASSESSMENT AND PLAN:  Mr. SHAQUEL CHAVOUS was seen today for 2 months follow-up.  Orders Placed This Encounter  Procedures  . ECHOCARDIOGRAM COMPLETE   1. Heart murmur Asymptomatic. Instructed about warning signs. Echo order placed. Instructed about warning signs.  - ECHOCARDIOGRAM COMPLETE; Future  2. Essential hypertension BP still mildly elevated at home. He has tolerated Losartan well, so increase dose from 25 mg to 50 mg. Continue monitoring BP and following low salt diet. Possible complications of elevated BP discussed.  - losartan (COZAAR) 50 MG tablet; Take 1 tablet (50 mg total) by mouth daily.  Dispense: 90 tablet; Refill: 1  Return in about 4 months (around 08/01/2020) for htn and awv.   [name]. [name], MD  [facility]. [place] office.  A few things to remember from today's visit:  Today Losartan increased to 50 mg daily. No changes in rest of your meds. Echo ordered again. Bring your blood pressure monitor next visit.  If you need refills please call your pharmacy. Do not use My Chart to request refills or for acute issues that need immediate attention.    Please be sure medication list is accurate. If a new problem present, please set up appointment sooner than planned today.

## 2020-03-31 NOTE — Patient Instructions (Signed)
A few things to remember from today's visit:  Today Losartan increased to 50 mg daily. No changes in rest of your meds. Echo ordered again. Bring your blood pressure monitor next visit.  If you need refills please call your pharmacy. Do not use My Chart to request refills or for acute issues that need immediate attention.    Please be sure medication list is accurate. If a new problem present, please set up appointment sooner than planned today.

## 2020-04-03 ENCOUNTER — Encounter: Payer: Self-pay | Admitting: Family Medicine

## 2020-04-18 ENCOUNTER — Ambulatory Visit (HOSPITAL_COMMUNITY): Payer: Medicare Other | Attending: Cardiology

## 2020-04-18 ENCOUNTER — Other Ambulatory Visit: Payer: Self-pay

## 2020-04-18 DIAGNOSIS — R011 Cardiac murmur, unspecified: Secondary | ICD-10-CM | POA: Diagnosis not present

## 2020-04-21 ENCOUNTER — Encounter: Payer: Self-pay | Admitting: Internal Medicine

## 2020-05-23 ENCOUNTER — Other Ambulatory Visit: Payer: Self-pay | Admitting: Family Medicine

## 2020-05-23 DIAGNOSIS — I10 Essential (primary) hypertension: Secondary | ICD-10-CM

## 2020-07-31 ENCOUNTER — Other Ambulatory Visit: Payer: Self-pay

## 2020-08-01 ENCOUNTER — Ambulatory Visit: Payer: Medicare Other | Admitting: Family Medicine

## 2020-08-01 ENCOUNTER — Encounter: Payer: Self-pay | Admitting: Family Medicine

## 2020-08-01 VITALS — BP 120/70 | HR 80 | Temp 98.0°F | Resp 16 | Ht 64.0 in | Wt 147.0 lb

## 2020-08-01 DIAGNOSIS — I77810 Thoracic aortic ectasia: Secondary | ICD-10-CM

## 2020-08-01 DIAGNOSIS — Z0000 Encounter for general adult medical examination without abnormal findings: Secondary | ICD-10-CM | POA: Diagnosis not present

## 2020-08-01 DIAGNOSIS — E785 Hyperlipidemia, unspecified: Secondary | ICD-10-CM

## 2020-08-01 DIAGNOSIS — I712 Thoracic aortic aneurysm, without rupture, unspecified: Secondary | ICD-10-CM | POA: Insufficient documentation

## 2020-08-01 DIAGNOSIS — Z23 Encounter for immunization: Secondary | ICD-10-CM | POA: Diagnosis not present

## 2020-08-01 DIAGNOSIS — I10 Essential (primary) hypertension: Secondary | ICD-10-CM

## 2020-08-01 DIAGNOSIS — J4520 Mild intermittent asthma, uncomplicated: Secondary | ICD-10-CM

## 2020-08-01 DIAGNOSIS — I5189 Other ill-defined heart diseases: Secondary | ICD-10-CM | POA: Insufficient documentation

## 2020-08-01 DIAGNOSIS — I5030 Unspecified diastolic (congestive) heart failure: Secondary | ICD-10-CM | POA: Insufficient documentation

## 2020-08-01 DIAGNOSIS — I5032 Chronic diastolic (congestive) heart failure: Secondary | ICD-10-CM

## 2020-08-01 MED ORDER — LOSARTAN POTASSIUM 50 MG PO TABS: 50.0000 mg | ORAL_TABLET | Freq: Every day | ORAL | 1 refills | Status: DC

## 2020-08-01 MED ORDER — FLOVENT HFA 44 MCG/ACT IN AERO: INHALATION_SPRAY | RESPIRATORY_TRACT | 3 refills | Status: DC

## 2020-08-01 NOTE — Patient Instructions (Signed)
  Mr. Scheier , Thank you for taking time to come for your Medicare Wellness Visit. I appreciate your ongoing commitment to your health goals. Please review the following plan we discussed and let me know if I can assist you in the future.   These are the goals we discussed: Goals    .  Acknowledge receipt of Advanced Directive package     Please review forms and consider signing them.    . Exercise 150 minutes per week (moderate activity)     Get the punching bag back out; start slow     . Exercise 3x per week (30 min per time)     Try to walk 3-4 times per week as exercise. Be careful with falls.       This is a list of the screening recommended for you and due dates:  Health Maintenance  Topic Date Due  . Flu Shot  06/15/2020  . COVID-19 Vaccine  Completed  . Pneumonia vaccines  Completed  . Tetanus Vaccine  Discontinued   A few tips:  -As we age balance is not as good as it was, so there is a higher risks for falls. Please remove small rugs and furniture that is "in your way" and could increase the risk of falls. Stretching exercises may help with fall prevention: Yoga and Tai Chi are some examples. Low impact exercise is better, so you are not very achy the next day.  -Sun screen and avoidance of direct sun light recommended. Caution with dehydration, if working outdoors be sure to drink enough fluids.  - Some medications are not safe as we age, increases the risk of side effects and can potentially interact with other medication you are also taken;  including some of over the counter medications. Be sure to let me know when you start a new medication even if it is a dietary/vitamin supplement.   -Healthy diet low in red meet/animal fat and sugar + regular physical activity is recommended.     A few things to remember from today's visit:   Medicare annual wellness visit, subsequent  Essential hypertension - Plan: COMPLETE METABOLIC PANEL WITH GFR  Dyslipidemia  (high LDL; low HDL) - Plan: COMPLETE METABOLIC PANEL WITH GFR, Lipid panel  If you need refills please call your pharmacy. Do not use My Chart to request refills or for acute issues that need immediate attention.   No changes today.  Please be sure medication list is accurate. If a new problem present, please set up appointment sooner than planned today.

## 2020-08-01 NOTE — Progress Notes (Signed)
HPI: [name] is a 85 y.o. male, who is here today for AWV and 6 months follow up.  Last AWV on 06/19/19.  He lives with his wife. Independent ADL's and IADL's. No falls in the past year and denies depression symptoms.  He does not exercise regularly but he is active with chores around the house and yard work. He follows a healthful diet. Sleeps about 6-9 hours.  Functional Status Survey: Is the patient deaf or have difficulty hearing?: No Does the patient have difficulty seeing, even when wearing glasses/contacts?: No Does the patient have difficulty concentrating, remembering, or making decisions?: No Does the patient have difficulty walking or climbing stairs?: No Does the patient have difficulty dressing or bathing?: No Does the patient have difficulty doing errands alone such as visiting a doctor's office or shopping?: No  Fall Risk  08/01/2020 08/01/2020 06/19/2019 04/06/2017 12/06/2016  Falls in the past year? 0 0 0 No No  Number falls in past yr: 0 - 0 - -  Injury with Fall? 0 - 0 - -  Follow up Education provided - Falls evaluation completed - -   Providers he sees regularly: Urologist, Dr Karsten Ro, last OV 02/2019. He has not heard about f/u appt. Last PSA 06/27/2018 , 3.9. He has not noted increased urinary frequency or gross hematuria. Nocturia is seldom.  He has not had an eye exam in a couple of years.  Depression screen [facility] 2/9 08/01/2020  Decreased Interest 0  Down, Depressed, Hopeless 0  PHQ - 2 Score 0    Mini-Cog - 08/01/20 0902    Normal clock drawing test? yes    How many words correct? 3           Hearing Screening   _0  _1  _2  _3  _4  _5  _6  _7  _8   Right ear:           Left ear:             Visual Acuity Screening   Right eye Left eye Both eyes  Without correction: _9  With correction:      He was last seen on 03/31/20. No new problems since his last OV.  HLD: He is on non pharmacologic  treatment.  Lab Results  Component Value Date   CHOL 174 06/19/2019   HDL 49.10 06/19/2019   LDLCALC 107 (H) 06/19/2019   LDLDIRECT 140.4 05/10/2007   TRIG 91.0 06/19/2019   CHOLHDL 4 06/19/2019   HTN: Dx'ed in his mid 32's. Home BP readings: Most 130's/70's, a few SBP's low 140's and 170 x 1.  He is on Losartan 50 mg daily and Amlodipine 5 mg daily. Negative for severe/frequent headache, visual changes, chest pain, dyspnea, palpitation, focal weakness, or edema.  Lab Results  Component Value Date   CREATININE 1.09 01/28/2020   BUN 15 01/28/2020   NA 142 01/28/2020   K 3.9 01/28/2020   CL 104 01/28/2020   CO2 30 01/28/2020   Ascending aortic dilation seen on echo in 04/2020. LVEF 65-70% and grade I diastolic dysfunction. Negative for orthopnea,PND.  Asthma and allergic rhinitis: He is on Singulair 10 mg daily. Flovent HFA 44 mcg 1 puff bid and Albuterol inh prn. He does not need Albuterol inh frequently. Negative for cough,wheezing,and SOB.  Ascending aortic dilation seen on echo done on 04/18/20. 1. Left ventricular ejection fraction, by estimation, is 65 to 70%. The left ventricle has normal function. The left ventricle has no regional wall motion  abnormalities. There is moderate concentric left ventricular hypertrophy. Left ventricular diastolic parameters are consistent with Grade I diastolic dysfunction (impaired relaxation). Elevated left atrial pressure. 2. Right ventricular systolic function is normal. The right ventricular size is normal. 3. The mitral valve is normal in structure. Mild mitral valve regurgitation. No evidence of mitral stenosis. 4. The aortic valve is normal in structure. Aortic valve regurgitation is mild. Mild to moderate aortic valve sclerosis/calcification is present, without any evidence of aortic stenosis. 5. Aortic dilatation noted. There is mild dilatation of the ascending aorta measuring 43 mm. 6. The inferior vena cava is normal in size with  greater than 50% respiratory variability,suggesting right atrial pressure of 3 mmHg.  Review of Systems  Constitutional: Negative for activity change, appetite change, fatigue and fever.  HENT: Negative for mouth sores, nosebleeds and sore throat.   Gastrointestinal: Negative for abdominal pain, nausea and vomiting.  Genitourinary: Negative for decreased urine volume and dysuria.  Musculoskeletal: Negative for gait problem and myalgias.  Skin: Negative for rash and wound.  Allergic/Immunologic: Positive for environmental allergies.  Neurological: Negative for syncope, facial asymmetry and weakness.  Psychiatric/Behavioral: Negative for confusion. The patient is not nervous/anxious.   Rest of ROS, see pertinent positives sand negatives in HPI  Current Outpatient Medications on File Prior to Visit  Medication Sig Dispense Refill  . albuterol (PROVENTIL HFA;VENTOLIN HFA) 108 (90 Base) MCG/ACT inhaler Inhale 2 puffs into the lungs every 6 (six) hours as needed for wheezing or shortness of breath. 1 Inhaler 2  . amLODipine (NORVASC) 5 MG tablet TAKE ONE AND ONE-HALF TABLETS BY MOUTH DAILY GENERIC EQUIVALENT FOR NORVASC 135 tablet 1  . aspirin 81 MG EC tablet Take 81 mg by mouth every morning.     . calcium carbonate (TUMS - DOSED IN MG ELEMENTAL CALCIUM) 500 MG chewable tablet Chew 1 tablet by mouth as needed for indigestion or heartburn. Reported on 11/19/2015    . hydrocortisone (ANUSOL-HC) 25 MG suppository Place 1 suppository (25 mg total) rectally 2 (two) times daily. 12 suppository 0  . montelukast (SINGULAIR) 10 MG tablet TAKE 1 TABLET BY MOUTH EVERY NIGHT AT BEDTIME GENERIC EQUIVALENT FOR SINGULAIR 90 tablet 1  . Multiple Vitamin (MULTIVITAMIN) tablet Take 1 tablet by mouth every morning. Reported on 11/19/2015    . Omega-3 Fatty Acids (FISH OIL) 1200 MG CAPS Take 1,200 mg by mouth every morning.     . polyvinyl alcohol (LIQUIFILM TEARS) 1.4 % ophthalmic solution Place 1 drop into both eyes  daily as needed for dry eyes. Reported on 11/19/2015     No current facility-administered medications on file prior to visit.   Past Medical History:  Diagnosis Date  . Allergy    SEASONAL  . Arthritis   . Asthma   . BPH (benign prostatic hypertrophy)   . GERD (gastroesophageal reflux disease)   . History of kidney stones   . History of myasthenia gravis    15 yrs ago - no current problem or treatment  . Hypertension   . Mild carotid artery disease ([place])    bilateral ICA 0-39%  per duplex 02-16-2013  . Multiple pulmonary nodules    per last CT stable  . Nephrolithiasis    BILATERAL   Allergies  Allergen Reactions  . Lisinopril Swelling    Social History   Socioeconomic History  . Marital status: Married    Spouse name: Not on file  . Number of children: Not on file  . Years of education: Not  on file  . Highest education level: Not on file  Occupational History  . Not on file  Tobacco Use  . Smoking status: Never Smoker  . Smokeless tobacco: Never Used  Vaping Use  . Vaping Use: Never used  Substance and Sexual Activity  . Alcohol use: No    Alcohol/week: 0.0 standard drinks  . Drug use: No  . Sexual activity: Not on file  Other Topics Concern  . Not on file  Social History Narrative  . Not on file   Social Determinants of Health   Financial Resource Strain:   . Difficulty of Paying Living Expenses: Not on file  Food Insecurity:   . Worried About Charity fundraiser in the Last Year: Not on file  . Ran Out of Food in the Last Year: Not on file  Transportation Needs:   . Lack of Transportation (Medical): Not on file  . Lack of Transportation (Non-Medical): Not on file  Physical Activity:   . Days of Exercise per Week: Not on file  . Minutes of Exercise per Session: Not on file  Stress:   . Feeling of Stress : Not on file  Social Connections:   . Frequency of Communication with Friends and Family: Not on file  . Frequency of Social Gatherings with  Friends and Family: Not on file  . Attends Religious Services: Not on file  . Active Member of Clubs or Organizations: Not on file  . Attends Archivist Meetings: Not on file  . Marital Status: Not on file    Vitals:   08/01/20 0823  BP: 120/70  Pulse: 80  Resp: 16  Temp: 98 F (36.7 C)  SpO2: 100%   Wt Readings from Last 3 Encounters:  08/01/20 147 lb (66.7 kg)  03/31/20 149 lb (67.6 kg)  01/28/20 151 lb (68.5 kg)   Body mass index is 25.23 kg/m.  Physical Exam Nursing note reviewed.  Constitutional:      General: He is not in acute distress.    Appearance: He is well-developed.  HENT:     Head: Normocephalic and atraumatic.     Mouth/Throat:     Mouth: Mucous membranes are moist.     Dentition: Has dentures.     Pharynx: Oropharynx is clear.  Eyes:     Conjunctiva/sclera: Conjunctivae normal.     Pupils: Pupils are equal, round, and reactive to light.  Cardiovascular:     Rate and Rhythm: Normal rate and regular rhythm.     Pulses:          Dorsalis pedis pulses are 2+ on the right side and 2+ on the left side.     Heart sounds: Murmur (SEM I/VI RUSB) heard.      Comments: Tortuous varicose veins RLE  Pulmonary:     Effort: Pulmonary effort is normal. No respiratory distress.     Breath sounds: Normal breath sounds.  Abdominal:     Palpations: Abdomen is soft. There is no hepatomegaly or mass.     Tenderness: There is no abdominal tenderness.  Lymphadenopathy:     Cervical: No cervical adenopathy.  Skin:    General: Skin is warm.     Findings: No erythema or rash.  Neurological:     Mental Status: He is alert and oriented to person, place, and time.     Cranial Nerves: No cranial nerve deficit.     Gait: Gait normal.  Psychiatric:     Comments: Well groomed,  good eye contact.   ASSESSMENT AND PLAN:  Mr. MENDELL Galvan was seen today for AWV and 6 months follow-up.  Orders Placed This Encounter  Procedures  . Flu Vaccine QUAD High  Dose(Fluad)  . COMPLETE METABOLIC PANEL WITH GFR  . Lipid panel   Lab Results  Component Value Date   CHOL 175 08/01/2020   HDL 52 08/01/2020   LDLCALC 103 (H) 08/01/2020   LDLDIRECT 140.4 05/10/2007   TRIG 100 08/01/2020   CHOLHDL 3.4 08/01/2020   Lab Results  Component Value Date   CREATININE 1.09 08/01/2020   BUN 14 08/01/2020   NA 139 08/01/2020   K 4.0 08/01/2020   CL 107 08/01/2020   CO2 26 08/01/2020   Lab Results  Component Value Date   ALT 17 08/01/2020   AST 24 08/01/2020   ALKPHOS 78 06/19/2019   BILITOT 0.3 08/01/2020   Medicare annual wellness visit, subsequent We discussed the importance of staying active, physically and mentally, as well as the benefits of a healthy/balance diet. Low impact exercise that involve stretching and strengthing are ideal. Vaccines up to date. We discussed preventive screening for the next 5-10 years, summery of recommendations given in AVS. Fall prevention.  Advance directives and end of life discussed, he does not have POA or living will. Advance directives package given.   Need for influenza vaccination -     Flu Vaccine QUAD High Dose(Fluad)  Essential hypertension BP adequately controlled. Continue Losartan 50 mg and Amlodipine 7.5 mg daily. Low salt diet. Recommend arranging eye exam.  Dyslipidemia (high LDL; low HDL) Continue non pharmacologic treatment. Further recommendations according to FLP results.  Asthma Problem is well controlled. No changes in current management. F/U in a year, before if needed.  (HFpEF) heart failure with preserved ejection fraction (HCC) Asymptomatic. Continue Losartan. Low sat diet.  Ascending aorta dilatation (HCC) Will plan on chest CT in 04/2021.   Return in about 6 months (around 01/29/2021).   Raziah Funnell G. [name], MD  [facility]. [place] office.   Mr. Lachapelle , Thank you for taking time to come for your Medicare Wellness Visit. I appreciate your ongoing  commitment to your health goals. Please review the following plan we discussed and let me know if I can assist you in the future.   These are the goals we discussed: Goals    .  Acknowledge receipt of Advanced Directive package     Please review forms and consider signing them.    . Exercise 150 minutes per week (moderate activity)     Get the punching bag back out; start slow     . Exercise 3x per week (30 min per time)     Try to walk 3-4 times per week as exercise. Be careful with falls.       This is a list of the screening recommended for you and due dates:  Health Maintenance  Topic Date Due  . Flu Shot  06/15/2020  . COVID-19 Vaccine  Completed  . Pneumonia vaccines  Completed  . Tetanus Vaccine  Discontinued   A few tips:  -As we age balance is not as good as it was, so there is a higher risks for falls. Please remove small rugs and furniture that is "in your way" and could increase the risk of falls. Stretching exercises may help with fall prevention: Yoga and Tai Chi are some examples. Low impact exercise is better, so you are not very achy the next  day.  -Sun screen and avoidance of direct sun light recommended. Caution with dehydration, if working outdoors be sure to drink enough fluids.  - Some medications are not safe as we age, increases the risk of side effects and can potentially interact with other medication you are also taken;  including some of over the counter medications. Be sure to let me know when you start a new medication even if it is a dietary/vitamin supplement.   -Healthy diet low in red meet/animal fat and sugar + regular physical activity is recommended.     A few things to remember from today's visit:   Medicare annual wellness visit, subsequent  Essential hypertension - Plan: COMPLETE METABOLIC PANEL WITH GFR  Dyslipidemia (high LDL; low HDL) - Plan: COMPLETE METABOLIC PANEL WITH GFR, Lipid panel  If you need refills please call your  pharmacy. Do not use My Chart to request refills or for acute issues that need immediate attention.   No changes today.  Please be sure medication list is accurate. If a new problem present, please set up appointment sooner than planned today.

## 2020-08-01 NOTE — Assessment & Plan Note (Signed)
Problem is well controlled. No changes in current management. F/U in a year, before if needed.

## 2020-08-01 NOTE — Assessment & Plan Note (Signed)
Will plan on chest CT in 04/2021.

## 2020-08-01 NOTE — Assessment & Plan Note (Addendum)
BP adequately controlled. Continue Losartan 50 mg and Amlodipine 7.5 mg daily. Low salt diet. Recommend arranging eye exam.

## 2020-08-01 NOTE — Assessment & Plan Note (Signed)
Asymptomatic. Continue Losartan. Low sat diet.

## 2020-08-01 NOTE — Assessment & Plan Note (Signed)
Continue non pharmacologic treatment. Further recommendations according to FLP results. 

## 2020-08-02 LAB — COMPLETE METABOLIC PANEL WITH GFR
AG Ratio: 1.4 (calc) (ref 1.0–2.5)
ALT: 17 U/L (ref 9–46)
AST: 24 U/L (ref 10–35)
Albumin: 4.1 g/dL (ref 3.6–5.1)
Alkaline phosphatase (APISO): 84 U/L (ref 35–144)
BUN: 14 mg/dL (ref 7–25)
CO2: 26 mmol/L (ref 20–32)
Calcium: 10.5 mg/dL — ABNORMAL HIGH (ref 8.6–10.3)
Chloride: 107 mmol/L (ref 98–110)
Creat: 1.09 mg/dL (ref 0.70–1.11)
GFR, Est African American: 73 mL/min/{1.73_m2} (ref >=60)
GFR, Est Non African American: 63 mL/min/{1.73_m2} (ref >=60)
Globulin: 3 g/dL (calc) (ref 1.9–3.7)
Glucose, Bld: 102 mg/dL — ABNORMAL HIGH (ref 65–99)
Potassium: 4 mmol/L (ref 3.5–5.3)
Sodium: 139 mmol/L (ref 135–146)
Total Bilirubin: 0.3 mg/dL (ref 0.2–1.2)
Total Protein: 7.1 g/dL (ref 6.1–8.1)

## 2020-08-02 LAB — LIPID PANEL
Cholesterol: 175 mg/dL (ref <200)
HDL: 52 mg/dL (ref >=40)
LDL Cholesterol (Calc): 103 mg/dL (calc) — ABNORMAL HIGH
Non-HDL Cholesterol (Calc): 123 mg/dL (calc) (ref <130)
Total CHOL/HDL Ratio: 3.4 (calc) (ref <5.0)
Triglycerides: 100 mg/dL (ref <150)

## 2020-08-21 ENCOUNTER — Ambulatory Visit: Payer: Medicare Other | Attending: Internal Medicine

## 2020-08-21 DIAGNOSIS — Z23 Encounter for immunization: Secondary | ICD-10-CM

## 2020-08-21 NOTE — Progress Notes (Signed)
   Covid-19 Vaccination Clinic  Name:  [name]    MRN: [id number] DOB: 11-26-1934  08/21/2020  [name] was observed post Covid-19 immunization for 15 minutes without incident. He was provided with Vaccine Information Sheet and instruction to access the V-Safe system.   [name] was instructed to call 911 with any severe reactions post vaccine: Marland Kitchen Difficulty breathing  . Swelling of face and throat  . A fast heartbeat  . A bad rash all over body  . Dizziness and weakness

## 2020-12-30 ENCOUNTER — Encounter: Payer: Self-pay | Admitting: Family Medicine

## 2020-12-30 ENCOUNTER — Other Ambulatory Visit: Payer: Self-pay

## 2020-12-30 ENCOUNTER — Ambulatory Visit (INDEPENDENT_AMBULATORY_CARE_PROVIDER_SITE_OTHER): Payer: Medicare Other | Admitting: Family Medicine

## 2020-12-30 VITALS — BP 130/80 | HR 61 | Resp 16 | Ht 64.0 in | Wt 147.0 lb

## 2020-12-30 DIAGNOSIS — E210 Primary hyperparathyroidism: Secondary | ICD-10-CM

## 2020-12-30 DIAGNOSIS — I10 Essential (primary) hypertension: Secondary | ICD-10-CM

## 2020-12-30 DIAGNOSIS — J4520 Mild intermittent asthma, uncomplicated: Secondary | ICD-10-CM | POA: Diagnosis not present

## 2020-12-30 DIAGNOSIS — E559 Vitamin D deficiency, unspecified: Secondary | ICD-10-CM | POA: Diagnosis not present

## 2020-12-30 DIAGNOSIS — I77810 Thoracic aortic ectasia: Secondary | ICD-10-CM

## 2020-12-30 DIAGNOSIS — I5032 Chronic diastolic (congestive) heart failure: Secondary | ICD-10-CM

## 2020-12-30 LAB — PHOSPHORUS: Phosphorus: 2.6 mg/dL (ref 2.3–4.6)

## 2020-12-30 LAB — VITAMIN D 25 HYDROXY (VIT D DEFICIENCY, FRACTURES): VITD: 37.05 ng/mL (ref 30.00–100.00)

## 2020-12-30 LAB — BASIC METABOLIC PANEL
BUN: 13 mg/dL (ref 6–23)
CO2: 31 mEq/L (ref 19–32)
Calcium: 10.8 mg/dL — ABNORMAL HIGH (ref 8.4–10.5)
Chloride: 104 mEq/L (ref 96–112)
Creatinine, Ser: 1.07 mg/dL (ref 0.40–1.50)
GFR: 64.28 mL/min (ref >60.00)
Glucose, Bld: 82 mg/dL (ref 70–99)
Potassium: 4.4 mEq/L (ref 3.5–5.1)
Sodium: 141 mEq/L (ref 135–145)

## 2020-12-30 MED ORDER — FLOVENT HFA 44 MCG/ACT IN AERO: INHALATION_SPRAY | RESPIRATORY_TRACT | 6 refills | Status: DC

## 2020-12-30 MED ORDER — LOSARTAN POTASSIUM 50 MG PO TABS: 50.0000 mg | ORAL_TABLET | Freq: Every day | ORAL | 2 refills | Status: DC

## 2020-12-30 MED ORDER — AMLODIPINE BESYLATE 5 MG PO TABS: 5.0000 mg | ORAL_TABLET | Freq: Every day | ORAL | 1 refills | Status: DC

## 2020-12-30 NOTE — Progress Notes (Signed)
HPI:  [name] is a 86 y.o.male here today for his routine physical examination.  He has Medicare, last AWV 08/01/20. He is not sure if his current health insurance covers a CPE; so we changed visit to follow-up.  Asthma: He does not need Albuterol inh often. Negative for cough and wheezing.  Hypertension: Stopped Amlodipine about 4 months ago. He was feeling drwsy. He was taking medication in the morning. He is on losartan 50 mg daily. He is concerned because elevated BP readings at home, up to 200/80. He tries to be compliant with taking medication daily, seldom he has forgotten taking it. He denies unusual headache, visual changes, CP, dyspnea, palpitations, focal neurologic deficit, or edema.  Echo done on 04/18/2020 showed LVEF 65 to 70%, diastolic dysfunction grade 1.  Mild AR, mild to moderate aortic valve sclerosis without evidence of aortic stenosis.  He has not noted orthopnea or PND. Echo also showed mild dilatation of the ascending aorta measuring 43 mm.  No history of tobacco use. Aortic atherosclerosis seen on abdominal imaging on 12/13/2017.  Lab Results  Component Value Date   CREATININE 1.09 08/01/2020   BUN 14 08/01/2020   NA 139 08/01/2020   K 4.0 08/01/2020   CL 107 08/01/2020   CO2 26 08/01/2020   Vit D supplementation in his multivitamin. HyperCa++  Lab Results  Component Value Date   PTH 48 01/28/2020   CALCIUM 10.5 (H) 08/01/2020   PHOS 2.4 09/17/2015  He has not noted abdominal pain, changes in bowel habits, muscle cramps, mood/MS changes.  Review of Systems  Constitutional: Negative for activity change, appetite change, fatigue and fever.  HENT: Negative for nosebleeds, sore throat and trouble swallowing.   Gastrointestinal: Negative for nausea and vomiting.  Genitourinary: Negative for decreased urine volume, dysuria and hematuria.  Musculoskeletal: Negative for gait problem.  Skin: Negative for pallor and rash.  Neurological:  Negative for syncope, facial asymmetry and weakness.   Current Outpatient Medications on File Prior to Visit  Medication Sig Dispense Refill   albuterol (PROVENTIL HFA;VENTOLIN HFA) 108 (90 Base) MCG/ACT inhaler Inhale 2 puffs into the lungs every 6 (six) hours as needed for wheezing or shortness of breath. 1 Inhaler 2   aspirin 81 MG EC tablet Take 81 mg by mouth every morning.     calcium carbonate (TUMS - DOSED IN MG ELEMENTAL CALCIUM) 500 MG chewable tablet Chew 1 tablet by mouth as needed for indigestion or heartburn. Reported on 11/19/2015     hydrocortisone (ANUSOL-HC) 25 MG suppository Place 1 suppository (25 mg total) rectally 2 (two) times daily. 12 suppository 0   montelukast (SINGULAIR) 10 MG tablet TAKE 1 TABLET BY MOUTH EVERY NIGHT AT BEDTIME GENERIC EQUIVALENT FOR SINGULAIR 90 tablet 1   Multiple Vitamin (MULTIVITAMIN) tablet Take 1 tablet by mouth every morning. Reported on 11/19/2015     Omega-3 Fatty Acids (FISH OIL) 1200 MG CAPS Take 1,200 mg by mouth every morning.     polyvinyl alcohol (LIQUIFILM TEARS) 1.4 % ophthalmic solution Place 1 drop into both eyes daily as needed for dry eyes. Reported on 11/19/2015     No current facility-administered medications on file prior to visit.     Past Medical History:  Diagnosis Date   Allergy    SEASONAL   Arthritis    Asthma    BPH (benign prostatic hypertrophy)    GERD (gastroesophageal reflux disease)    History of kidney stones    History of myasthenia  gravis    15 yrs ago - no current problem or treatment   Hypertension    Mild carotid artery disease (HCC)    bilateral ICA 0-39%  per duplex 02-16-2013   Multiple pulmonary nodules    per last CT stable   Nephrolithiasis    BILATERAL    Past Surgical History:  Procedure Laterality Date   CATARACT EXTRACTION W/ INTRAOCULAR LENS  IMPLANT, BILATERAL  2007   COLONOSCOPY     COLONOSCOPY W/ POLYPECTOMY  05-14-2010   CYSTO/  LEFT RETROGRADE PYELOGRAM/   LEFT URETEROSCOPY/  LEFT STENT PLACEMENT  06-17-2008   CYSTOSCOPY W/ RETROGRADES Bilateral 03/24/2015   Procedure: CYSTOSCOPY WITH RETROGRADE PYELOGRAM;  Surgeon: Ihor Gully, MD;  Location: [facility];  Service: Urology;  Laterality: Bilateral;   CYSTOSCOPY WITH STENT PLACEMENT Right 03/24/2015   Procedure: CYSTOSCOPY WITH STENT PLACEMENT;  Surgeon: Ihor Gully, MD;  Location: [facility];  Service: Urology;  Laterality: Right;   CYSTOSCOPY WITH URETEROSCOPY Right 03/24/2015   Procedure: CYSTOSCOPY WITH URETEROSCOPY;  Surgeon: Ihor Gully, MD;  Location: [facility];  Service: Urology;  Laterality: Right;   EXTRACORPOREAL SHOCK WAVE LITHOTRIPSY Bilateral left 02-23-2010 //   right 09-28-2010   HOLMIUM LASER APPLICATION Right 03/24/2015   Procedure: HOLMIUM LASER APPLICATION;  Surgeon: Ihor Gully, MD;  Location: [facility];  Service: Urology;  Laterality: Right;   INGUINAL HERNIA REPAIR Bilateral 2002 approx.   NEPHROLITHOTOMY Left 05/30/2015   Procedure: LEFT PERCUTANEOUS NEPHROLITHOTOMY;  Surgeon: Ihor Gully, MD;  Location: [facility];  Service: Urology;  Laterality: Left;   POLYPECTOMY     STONE EXTRACTION WITH BASKET Right 03/24/2015   Procedure: STONE EXTRACTION WITH BASKET;  Surgeon: Ihor Gully, MD;  Location: [facility];  Service: Urology;  Laterality: Right;    Allergies  Allergen Reactions   Lisinopril Swelling    Family History  Problem Relation Age of Onset   Heart disease Mother    Hypertension Mother    Kidney disease Mother    Heart disease Father    Hypertension Father    Diabetes Sister    Diabetes Sister    Hypercalcemia Neg Hx     Social History   Socioeconomic History   Marital status: Married    Spouse name: Not on file   Number of children: Not on file   Years of education: Not on file   Highest education level: Not on file  Occupational History   Not on file   Tobacco Use   Smoking status: Never Smoker   Smokeless tobacco: Never Used  Vaping Use   Vaping Use: Never used  Substance and Sexual Activity   Alcohol use: No    Alcohol/week: 0.0 standard drinks   Drug use: No   Sexual activity: Not on file  Other Topics Concern   Not on file  Social History Narrative   Not on file   Social Determinants of Health   Financial Resource Strain: Not on file  Food Insecurity: Not on file  Transportation Needs: Not on file  Physical Activity: Not on file  Stress: Not on file  Social Connections: Not on file   Vitals:   12/30/20 1110  BP: 130/80  Pulse: 61  Resp: 16  SpO2: 100%   Body mass index is 25.23 kg/m.  Wt Readings from Last 3 Encounters:  12/30/20 147 lb (66.7 kg)  08/01/20 147 lb (66.7 kg)  03/31/20 149 lb (67.6 kg)  Physical Exam Vitals and nursing note reviewed.  Constitutional:      General: He is not in acute distress.    Appearance: He is well-developed.  HENT:     Head: Normocephalic and atraumatic.     Mouth/Throat:     Mouth: Mucous membranes are moist.     Pharynx: Oropharynx is clear.  Eyes:     Conjunctiva/sclera: Conjunctivae normal.  Cardiovascular:     Rate and Rhythm: Normal rate and regular rhythm.     Pulses:          Dorsalis pedis pulses are 2+ on the right side and 2+ on the left side.     Heart sounds: Murmur (SEM and early diastolic I/VI RUSB) heard.    Pulmonary:     Effort: Pulmonary effort is normal. No respiratory distress.     Breath sounds: Normal breath sounds.  Abdominal:     Palpations: Abdomen is soft. There is no hepatomegaly or mass.     Tenderness: There is no abdominal tenderness.  Musculoskeletal:     Right lower leg: No edema.     Left lower leg: No edema.  Lymphadenopathy:     Cervical: No cervical adenopathy.  Skin:    General: Skin is warm.     Findings: No erythema or rash.  Neurological:     Mental Status: He is alert and oriented to person, place, and  time.     Cranial Nerves: No cranial nerve deficit.     Gait: Gait normal.  Psychiatric:     Comments: Well groomed, good eye contact.   ASSESSMENT AND PLAN:  BrianAcie was seen today for asthma and hypertension.  Diagnoses and all orders for this visit:  Orders Placed This Encounter  Procedures   CT Chest W Contrast   Parathyroid hormone, intact (no Ca)   VITAMIN D 25 Hydroxy (Vit-D Deficiency, Fractures)   Phosphorus   Basic Metabolic Panel   Lab Results  Component Value Date   CREATININE 1.07 12/30/2020   BUN 13 12/30/2020   NA 141 12/30/2020   K 4.4 12/30/2020   CL 104 12/30/2020   CO2 31 12/30/2020   Lab Results  Component Value Date   PTH 85 (H) 12/30/2020   CALCIUM 10.8 (H) 12/30/2020   PHOS 2.6 12/30/2020    Asthma Problem is well controlled. Continue Flovent 44 mcg bid and Singulair 10 mg daily. Continue Albuterol inh 2 puff q 6 hours as needed.  Essential hypertension Re-checked LUE 160/80 x 2, RUE 180/80. He agrees with resuming Amlodipine but lower dose and at bedtime. Continue losartan 50 mg daily. We discussed possible complications of elevated BP. Son side effects of medication discussed. Continue low-salt diet. Continue monitoring BP regularly.  Ascending aorta dilatation (HCC) Mild dilation of ascending aorta measuring 43 mm seen on echo on 04/18/2020. BB would be a good options for him but he has HR between 55-60/min. We will arrange chest CT. Instructed about warning signs.  (HFpEF) heart failure with preserved ejection fraction (HCC) Asymptomatic. Continue Losartan 50 mg daily. Adequate BP controlled and low salt diet.  Vitamin D deficiency, unspecified Mild. Continue current dose of vitamin D supplementation. Further recommendation will be given according to 25 [place] vitamin D result.  Primary hyperparathyroidism (HCC) Asymptomatic. Further recommendations according to lab results.  Spent 42 minutes.  During this time history  was obtained and documented, examination was performed, prior labs/imaging reviewed, and assessment/plan discussed.  Return in about 3 months (around 03/29/2021) for  HTN.   Taiyo Kozma G. [place], MD  [facility]. [place] office.  A few things to remember from today's visit:   Dilated aortic root (HCC) - Plan: CT Chest W Contrast  Mild intermittent asthma without complication  Essential hypertension - Plan: amLODipine (NORVASC) 5 MG tablet, Basic metabolic panel  Vitamin D deficiency, unspecified - Plan: Parathyroid hormone, intact (no Ca), VITAMIN D 25 Hydroxy (Vit-D Deficiency, Fractures)  If you need refills please call your pharmacy. Do not use My Chart to request refills or for acute issues that need immediate attention.   Try Amlodipine again but lower dose, 5 mg, and at bedtime. No changes in Losartan. Check blood pressure right arm.  Please be sure medication list is accurate. If a new problem present, please set up appointment sooner than planned today.

## 2020-12-30 NOTE — Assessment & Plan Note (Signed)
Problem is well controlled. Continue Flovent 44 mcg bid and Singulair 10 mg daily. Continue Albuterol inh 2 puff q 6 hours as needed.

## 2020-12-30 NOTE — Assessment & Plan Note (Addendum)
Re-checked LUE 160/80 x 2, RUE 180/80. He agrees with resuming Amlodipine but lower dose and at bedtime. Continue losartan 50 mg daily. We discussed possible complications of elevated BP. Son side effects of medication discussed. Continue low-salt diet. Continue monitoring BP regularly.

## 2020-12-30 NOTE — Patient Instructions (Addendum)
A few things to remember from today's visit:   Dilated aortic root (HCC) - Plan: CT Chest W Contrast  Mild intermittent asthma without complication  Essential hypertension - Plan: amLODipine (NORVASC) 5 MG tablet, Basic metabolic panel  Vitamin D deficiency, unspecified - Plan: Parathyroid hormone, intact (no Ca), VITAMIN D 25 Hydroxy (Vit-D Deficiency, Fractures)  If you need refills please call your pharmacy. Do not use My Chart to request refills or for acute issues that need immediate attention.   Try Amlodipine again but lower dose, 5 mg, and at bedtime. No changes in Losartan. Check blood pressure right arm.  Please be sure medication list is accurate. If a new problem present, please set up appointment sooner than planned today.

## 2020-12-30 NOTE — Assessment & Plan Note (Signed)
Asymptomatic. Continue Losartan 50 mg daily. Adequate BP controlled and low salt diet.

## 2020-12-30 NOTE — Assessment & Plan Note (Signed)
Mild. Continue current dose of vitamin D supplementation. Further recommendation will be given according to 25 OH vitamin D result.

## 2020-12-30 NOTE — Assessment & Plan Note (Signed)
Mild dilation of ascending aorta measuring 43 mm seen on echo on 04/18/2020. BB would be a good options for him but he has HR between 55-60/min. We will arrange chest CT. Instructed about warning signs.

## 2020-12-31 LAB — PARATHYROID HORMONE, INTACT (NO CA): PTH: 85 pg/mL — ABNORMAL HIGH (ref 14–64)

## 2021-01-12 ENCOUNTER — Other Ambulatory Visit: Payer: Self-pay

## 2021-01-12 DIAGNOSIS — R972 Elevated prostate specific antigen [PSA]: Secondary | ICD-10-CM | POA: Diagnosis not present

## 2021-01-12 DIAGNOSIS — I10 Essential (primary) hypertension: Secondary | ICD-10-CM

## 2021-01-12 DIAGNOSIS — N200 Calculus of kidney: Secondary | ICD-10-CM | POA: Diagnosis not present

## 2021-01-12 MED ORDER — AMLODIPINE BESYLATE 5 MG PO TABS: 5.0000 mg | ORAL_TABLET | Freq: Every day | ORAL | 1 refills | Status: DC

## 2021-01-15 ENCOUNTER — Other Ambulatory Visit: Payer: Self-pay

## 2021-01-15 ENCOUNTER — Ambulatory Visit
Admission: RE | Admit: 2021-01-15 | Discharge: 2021-01-15 | Disposition: A | Discharge status: Home / Self Care | Payer: Medicare Other | Source: Ambulatory Visit | Attending: Family Medicine | Admitting: Family Medicine

## 2021-01-15 DIAGNOSIS — J9811 Atelectasis: Secondary | ICD-10-CM | POA: Diagnosis not present

## 2021-01-15 DIAGNOSIS — K8080 Other cholelithiasis without obstruction: Secondary | ICD-10-CM | POA: Diagnosis not present

## 2021-01-15 DIAGNOSIS — J984 Other disorders of lung: Secondary | ICD-10-CM | POA: Diagnosis not present

## 2021-01-15 DIAGNOSIS — I712 Thoracic aortic aneurysm, without rupture: Secondary | ICD-10-CM | POA: Diagnosis not present

## 2021-01-15 DIAGNOSIS — I77810 Thoracic aortic ectasia: Secondary | ICD-10-CM

## 2021-01-15 MED ORDER — IOPAMIDOL (ISOVUE-300) INJECTION 61%
75.0000 mL | Freq: Once | INTRAVENOUS | Status: AC | PRN
  Administered 2021-01-15: 75 mL via INTRAVENOUS

## 2021-01-22 ENCOUNTER — Other Ambulatory Visit: Payer: Self-pay | Admitting: Family Medicine

## 2021-01-22 DIAGNOSIS — J4520 Mild intermittent asthma, uncomplicated: Secondary | ICD-10-CM

## 2021-01-25 ENCOUNTER — Encounter: Payer: Self-pay | Admitting: Family Medicine

## 2021-02-24 ENCOUNTER — Other Ambulatory Visit: Payer: Self-pay | Admitting: Family Medicine

## 2021-03-06 ENCOUNTER — Ambulatory Visit (INDEPENDENT_AMBULATORY_CARE_PROVIDER_SITE_OTHER): Payer: Medicare Other | Admitting: Family Medicine

## 2021-03-06 ENCOUNTER — Encounter: Payer: Self-pay | Admitting: Family Medicine

## 2021-03-06 ENCOUNTER — Other Ambulatory Visit: Payer: Self-pay

## 2021-03-06 VITALS — BP 122/86 | HR 83 | Temp 98.5°F | Wt 146.4 lb

## 2021-03-06 DIAGNOSIS — K219 Gastro-esophageal reflux disease without esophagitis: Secondary | ICD-10-CM | POA: Diagnosis not present

## 2021-03-06 MED ORDER — PANTOPRAZOLE SODIUM 20 MG PO TBEC: 20.0000 mg | DELAYED_RELEASE_TABLET | Freq: Every day | ORAL | 1 refills | Status: DC

## 2021-03-06 NOTE — Patient Instructions (Signed)
Gastroesophageal Reflux Disease, Adult  Gastroesophageal reflux (GER) happens when acid from the stomach flows up into the tube that connects the mouth and the stomach (esophagus). Normally, food travels down the esophagus and stays in the stomach to be digested. With GER, food and stomach acid sometimes move back up into the esophagus. You may have a disease called gastroesophageal reflux disease (GERD) if the reflux:  Happens often.  Causes frequent or very bad symptoms.  Causes problems such as damage to the esophagus. When this happens, the esophagus becomes sore and swollen. Over time, GERD can make small holes (ulcers) in the lining of the esophagus. What are the causes? This condition is caused by a problem with the muscle between the esophagus and the stomach. When this muscle is weak or not normal, it does not close properly to keep food and acid from coming back up from the stomach. The muscle can be weak because of:  Tobacco use.  Pregnancy.  Having a certain type of hernia (hiatal hernia).  Alcohol use.  Certain foods and drinks, such as coffee, chocolate, onions, and peppermint. What increases the risk?  Being overweight.  Having a disease that affects your connective tissue.  Taking NSAIDs, such a ibuprofen. What are the signs or symptoms?  Heartburn.  Difficult or painful swallowing.  The feeling of having a lump in the throat.  A bitter taste in the mouth.  Bad breath.  Having a lot of saliva.  Having an upset or bloated stomach.  Burping.  Chest pain. Different conditions can cause chest pain. Make sure you see your doctor if you have chest pain.  Shortness of breath or wheezing.  A long-term cough or a cough at night.  Wearing away of the surface of teeth (tooth enamel).  Weight loss. How is this treated?  Making changes to your diet.  Taking medicine.  Having surgery. Treatment will depend on how bad your symptoms are. Follow these  instructions at home: Eating and drinking  Follow a diet as told by your doctor. You may need to avoid foods and drinks such as: ? Coffee and tea, with or without caffeine. ? Drinks that contain alcohol. ? Energy drinks and sports drinks. ? Bubbly (carbonated) drinks or sodas. ? Chocolate and cocoa. ? Peppermint and mint flavorings. ? Garlic and onions. ? Horseradish. ? Spicy and acidic foods. These include peppers, chili powder, curry powder, vinegar, hot sauces, and BBQ sauce. ? Citrus fruit juices and citrus fruits, such as oranges, lemons, and limes. ? Tomato-based foods. These include red sauce, chili, salsa, and pizza with red sauce. ? Fried and fatty foods. These include donuts, french fries, potato chips, and high-fat dressings. ? High-fat meats. These include hot dogs, rib eye steak, sausage, ham, and bacon. ? High-fat dairy items, such as whole milk, butter, and cream cheese.  Eat small meals often. Avoid eating large meals.  Avoid drinking large amounts of liquid with your meals.  Avoid eating meals during the 2-3 hours before bedtime.  Avoid lying down right after you eat.  Do not exercise right after you eat.   Lifestyle  Do not smoke or use any products that contain nicotine or tobacco. If you need help quitting, ask your doctor.  Try to lower your stress. If you need help doing this, ask your doctor.  If you are overweight, lose an amount of weight that is healthy for you. Ask your doctor about a safe weight loss goal.   General instructions    Pay attention to any changes in your symptoms.  Take over-the-counter and prescription medicines only as told by your doctor.  Do not take aspirin, ibuprofen, or other NSAIDs unless your doctor says it is okay.  Wear loose clothes. Do not wear anything tight around your waist.  Raise (elevate) the head of your bed about 6 inches (15 cm). You may need to use a wedge to do this.  Avoid bending over if this makes your  symptoms worse.  Keep all follow-up visits. Contact a doctor if:  You have new symptoms.  You lose weight and you do not know why.  You have trouble swallowing or it hurts to swallow.  You have wheezing or a cough that keeps happening.  You have a hoarse voice.  Your symptoms do not get better with treatment. Get help right away if:  You have sudden pain in your arms, neck, jaw, teeth, or back.  You suddenly feel sweaty, dizzy, or light-headed.  You have chest pain or shortness of breath.  You vomit and the vomit is green, yellow, or black, or it looks like blood or coffee grounds.  You faint.  Your poop (stool) is red, bloody, or black.  You cannot swallow, drink, or eat. These symptoms may represent a serious problem that is an emergency. Do not wait to see if the symptoms will go away. Get medical help right away. Call your local emergency services (911 in the [place] Do not drive yourself to the hospital. Summary  If a person has gastroesophageal reflux disease (GERD), food and stomach acid move back up into the esophagus and cause symptoms or problems such as damage to the esophagus.  Treatment will depend on how bad your symptoms are.  Follow a diet as told by your doctor.  Take all medicines only as told by your doctor. This information is not intended to replace advice given to you by your health care provider. Make sure you discuss any questions you have with your health care provider. Document Revised: 05/12/2020 Document Reviewed: 05/12/2020 Elsevier Patient Education  2021 [place] for Gastroesophageal Reflux Disease, Adult When you have gastroesophageal reflux disease (GERD), the foods you eat and your eating habits are very important. Choosing the right foods can help ease your discomfort. Think about working with a food expert (dietitian) to help you make good choices. What are tips for following this plan? Reading food labels  Look  for foods that are low in saturated fat. Foods that may help with your symptoms include: ? Foods that have less than 5% of daily value (DV) of fat. ? Foods that have 0 grams of trans fat. Cooking  Do not fry your food.  Cook your food by baking, steaming, grilling, or broiling. These are all methods that do not need a lot of fat for cooking.  To add flavor, try to use herbs that are low in spice and acidity. Meal planning  Choose healthy foods that are low in fat, such as: ? Fruits and vegetables. ? Whole grains. ? Low-fat dairy products. ? Lean meats, fish, and poultry.  Eat small meals often instead of eating 3 large meals each day. Eat your meals slowly in a place where you are relaxed. Avoid bending over or lying down until 2-3 hours after eating.  Limit high-fat foods such as fatty meats or fried foods.  Limit your intake of fatty foods, such as oils, butter, and shortening.  Avoid the following as told  by your doctor: ? Foods that cause symptoms. These may be different for different people. Keep a food diary to keep track of foods that cause symptoms. ? Alcohol. ? Drinking a lot of liquid with meals. ? Eating meals during the 2-3 hours before bed.   Lifestyle  Stay at a healthy weight. Ask your doctor what weight is healthy for you. If you need to lose weight, work with your doctor to do so safely.  Exercise for at least 30 minutes on 5 or more days each week, or as told by your doctor.  Wear loose-fitting clothes.  Do not smoke or use any products that contain nicotine or tobacco. If you need help quitting, ask your doctor.  Sleep with the head of your bed higher than your feet. Use a wedge under the mattress or blocks under the bed frame to raise the head of the bed.  Chew sugar-free gum after meals. What foods should eat? Eat a healthy, well-balanced diet of fruits, vegetables, whole grains, low-fat dairy products, lean meats, fish, and poultry. Each person is  different. Foods that may cause symptoms in one person may not cause any symptoms in another person. Work with your doctor to find foods that are safe for you. The items listed above may not be a complete list of what you can eat and drink. Contact a food expert for more options.   What foods should I avoid? Limiting some of these foods may help in managing the symptoms of GERD. Everyone is different. Talk with a food expert or your doctor to help you find the exact foods to avoid, if any. Fruits Any fruits prepared with added fat. Any fruits that cause symptoms. For some people, this may include citrus fruits, such as oranges, grapefruit, pineapple, and lemons. Vegetables Deep-fried vegetables. [place] fries. Any vegetables prepared with added fat. Any vegetables that cause symptoms. For some people, this may include tomatoes and tomato products, chili peppers, onions and garlic, and horseradish. Grains Pastries or quick breads with added fat. Meats and other proteins High-fat meats, such as fatty beef or pork, hot dogs, ribs, ham, sausage, salami, and bacon. Fried meat or protein, including fried fish and fried chicken. Nuts and nut butters, in large amounts. Dairy Whole milk and chocolate milk. Sour cream. Cream. Ice cream. Cream cheese. Milkshakes. Fats and oils Butter. Margarine. Shortening. Ghee. Beverages Coffee and tea, with or without caffeine. Carbonated beverages. Sodas. Energy drinks. Fruit juice made with acidic fruits, such as orange or grapefruit. Tomato juice. Alcoholic drinks. Sweets and desserts Chocolate and cocoa. Donuts. Seasonings and condiments Pepper. Peppermint and spearmint. Added salt. Any condiments, herbs, or seasonings that cause symptoms. For some people, this may include curry, hot sauce, or vinegar-based salad dressings. The items listed above may not be a complete list of what you should not eat and drink. Contact a food expert for more options. Questions to  ask your doctor Diet and lifestyle changes are often the first steps that are taken to manage symptoms of GERD. If diet and lifestyle changes do not help, talk with your doctor about taking medicines. Where to find more information  International Foundation for Gastrointestinal Disorders: aboutgerd.org Summary  When you have GERD, food and lifestyle choices are very important in easing your symptoms.  Eat small meals often instead of 3 large meals a day. Eat your meals slowly and in a place where you are relaxed.  Avoid bending over or lying down until 2-3 hours after eating.  Limit high-fat foods such as fatty meats or fried foods. This information is not intended to replace advice given to you by your health care provider. Make sure you discuss any questions you have with your health care provider. Document Revised: 05/12/2020 Document Reviewed: 05/12/2020 Elsevier Patient Education  [place].

## 2021-03-06 NOTE — Progress Notes (Signed)
Subjective:    Patient ID: [name], male    DOB: 1935-03-10, 86 y.o.   MRN: [id number]  No chief complaint on file.   HPI Patient was seen today for ongoing concern.  Patient endorses acid reflux symptoms x2-3 weeks which started after drinking fruit punch with orange juice 2-3 times per day.  Patient notes a burning sensation in chest, hoarse voice, acid taste in mouth.  Symptoms worse at bedtime.  Patient having dinner at 5 PM and going to sleep around midnight.  Having a snack at 9 or 10 PM.  Patient notes weight stable.  Taking Tums but they are no longer working.  Past Medical History:  Diagnosis Date  . Allergy    SEASONAL  . Arthritis   . Asthma   . BPH (benign prostatic hypertrophy)   . GERD (gastroesophageal reflux disease)   . History of kidney stones   . History of myasthenia gravis    15 yrs ago - no current problem or treatment  . Hypertension   . Mild carotid artery disease (HCC)    bilateral ICA 0-39%  per duplex 02-16-2013  . Multiple pulmonary nodules    per last CT stable  . Nephrolithiasis    BILATERAL    Allergies  Allergen Reactions  . Lisinopril Swelling    ROS General: Denies fever, chills, night sweats, changes in weight, changes in appetite HEENT: Denies headaches, ear pain, changes in vision, rhinorrhea, sore throat  + hoarse voice, acid taste in mouth CV: Denies CP, palpitations, SOB, orthopnea Pulm: Denies SOB, cough, wheezing GI: Denies abdominal pain, nausea, vomiting, diarrhea, constipation  +GERD GU: Denies dysuria, hematuria, frequency Msk: Denies muscle cramps, joint pains Neuro: Denies weakness, numbness, tingling Skin: Denies rashes, bruising Psych: Denies depression, anxiety, hallucinations      Objective:    Blood pressure 122/86, pulse 83, temperature 98.5 F (36.9 C), temperature source Oral, weight 146 lb 6.4 oz (66.4 kg), SpO2 95 %.  Gen. Pleasant, well-nourished, in no distress, normal affect  HEENT: [place]/AT,  face symmetric, conjunctiva clear, no scleral icterus, PERRLA, EOMI, nares patent without drainage, pharynx with mild erythema, no exudate. Lungs: no accessory muscle use Cardiovascular: RRR, no peripheral edema Musculoskeletal: No deformities, no cyanosis or clubbing, normal tone Neuro:  A&Ox3, CN II-XII intact, normal gait Skin:  Warm, no lesions/ rash   Wt Readings from Last 3 Encounters:  03/06/21 146 lb 6.4 oz (66.4 kg)  12/30/20 147 lb (66.7 kg)  08/01/20 147 lb (66.7 kg)    Lab Results  Component Value Date   WBC 6.7 06/19/2019   HGB 14.0 06/19/2019   HCT 41.3 06/19/2019   PLT 204.0 06/19/2019   GLUCOSE 82 12/30/2020   CHOL 175 08/01/2020   TRIG 100 08/01/2020   HDL 52 08/01/2020   LDLDIRECT 140.4 05/10/2007   LDLCALC 103 (H) 08/01/2020   ALT 17 08/01/2020   AST 24 08/01/2020   NA 141 12/30/2020   K 4.4 12/30/2020   CL 104 12/30/2020   CREATININE 1.07 12/30/2020   BUN 13 12/30/2020   CO2 31 12/30/2020   TSH 2.08 12/05/2017   PSA 6.80 (H) 03/13/2018   INR 1.03 12/13/2017    Assessment/Plan:  Gastroesophageal reflux disease, unspecified whether esophagitis present  -Discussed foods known to cause symptoms including acidic things such as fruit punch with lime juice -Patient advised to keep a food diary -Given handouts -We will start Protonix 20 mg daily -For continued or worsening symptoms consider GI follow-up -  Plan: pantoprazole (PROTONIX) 20 MG tablet  F/u prn in the next few wks with pcp.  Abbe Amsterdam, MD

## 2021-04-03 ENCOUNTER — Ambulatory Visit (INDEPENDENT_AMBULATORY_CARE_PROVIDER_SITE_OTHER): Payer: Medicare Other | Admitting: Family Medicine

## 2021-04-03 ENCOUNTER — Other Ambulatory Visit: Payer: Self-pay

## 2021-04-03 ENCOUNTER — Encounter: Payer: Self-pay | Admitting: Family Medicine

## 2021-04-03 VITALS — BP 136/80 | HR 65 | Temp 98.5°F | Resp 16 | Ht 64.0 in | Wt 145.2 lb

## 2021-04-03 DIAGNOSIS — I10 Essential (primary) hypertension: Secondary | ICD-10-CM | POA: Diagnosis not present

## 2021-04-03 DIAGNOSIS — I712 Thoracic aortic aneurysm, without rupture, unspecified: Secondary | ICD-10-CM

## 2021-04-03 DIAGNOSIS — I700 Atherosclerosis of aorta: Secondary | ICD-10-CM | POA: Diagnosis not present

## 2021-04-03 DIAGNOSIS — K219 Gastro-esophageal reflux disease without esophagitis: Secondary | ICD-10-CM

## 2021-04-03 MED ORDER — ROSUVASTATIN CALCIUM 10 MG PO TABS: 10.0000 mg | ORAL_TABLET | Freq: Every day | ORAL | 3 refills | Status: DC

## 2021-04-03 MED ORDER — METOPROLOL SUCCINATE ER 25 MG PO TB24: 25.0000 mg | ORAL_TABLET | Freq: Every day | ORAL | 1 refills | Status: DC

## 2021-04-03 NOTE — Progress Notes (Signed)
HPI:  [name] is a 86 y.o. male, who is here today for 3 months follow up.   He was last seen on 12/30/20. No new problems since his last visit. He was seen on 03/06/21 because heartburn, Pantoprazole 20 mg was recommended. He stopped PPI ,afraid of possible side effects. Stopped sodas, increased water intake and problem improved. Heartburn is now occasionally. Negative for abdominal pain,N/V,or changes in bowel habits.  HTN: Last visit Amlodipine resumed but a lower dose because it was causing some side effects, drowsiness and blurry vision.  He is on Amlodipine 5 and Losartan 50 mg daily. Eye dryness and still having blurry vision for distance sight, these problems resolved when he discontinued Amlodipine and reoccurred when he resumed medication. He has not noted conjunctival erythema or eye pain. Last eye exam 3 years ago. Denies severe/frequent headache,chest pain, dyspnea, palpitation, claudication, focal weakness, or edema.  BP readings at home, 130's/70's.  Lab Results  Component Value Date   CREATININE 1.07 12/30/2020   BUN 13 12/30/2020   NA 141 12/30/2020   K 4.4 12/30/2020   CL 104 12/30/2020   CO2 31 12/30/2020   Chest CT on 01/15/21 showed ascending thoracic aortic aneurysm,4.3 cm.Stable pulmonary nodules and aortic atherosclerosis. He is not on statin. He takes Aspirin 81 mg daily.  Lab Results  Component Value Date   CHOL 175 08/01/2020   HDL 52 08/01/2020   LDLCALC 103 (H) 08/01/2020   LDLDIRECT 140.4 05/10/2007   TRIG 100 08/01/2020   CHOLHDL 3.4 08/01/2020   Review of Systems  Constitutional: Negative for activity change, appetite change, fatigue and fever.  HENT: Negative for mouth sores, nosebleeds and sore throat.   Respiratory: Negative for cough and wheezing.   Genitourinary: Negative for decreased urine volume and hematuria.  Musculoskeletal: Negative for gait problem and myalgias.  Skin: Negative for pallor and rash.   Neurological: Negative for syncope and facial asymmetry.  Rest of ROS, see pertinent positives sand negatives in HPI  Current Outpatient Medications on File Prior to Visit  Medication Sig Dispense Refill  . albuterol (PROVENTIL HFA;VENTOLIN HFA) 108 (90 Base) MCG/ACT inhaler Inhale 2 puffs into the lungs every 6 (six) hours as needed for wheezing or shortness of breath. 1 Inhaler 2  . aspirin 81 MG EC tablet Take 81 mg by mouth every morning.    . calcium carbonate (TUMS - DOSED IN MG ELEMENTAL CALCIUM) 500 MG chewable tablet Chew 1 tablet by mouth as needed for indigestion or heartburn. Reported on 11/19/2015    . FLOVENT HFA 44 MCG/ACT inhaler INHALE 1 PUFF BY MOUTH INTO THE LUNGS TWICE DAILY 21.2 g 2  . hydrocortisone (ANUSOL-HC) 25 MG suppository Place 1 suppository (25 mg total) rectally 2 (two) times daily. 12 suppository 0  . losartan (COZAAR) 50 MG tablet Take 1 tablet (50 mg total) by mouth daily. 90 tablet 2  . montelukast (SINGULAIR) 10 MG tablet TAKE 1 TABLET BY MOUTH EVERY NIGHT AT BEDTIME GENERIC EQUIVALENT FOR SINGULAIR 90 tablet 1  . Multiple Vitamin (MULTIVITAMIN) tablet Take 1 tablet by mouth every morning. Reported on 11/19/2015    . Omega-3 Fatty Acids (FISH OIL) 1200 MG CAPS Take 1,200 mg by mouth every morning.    . pantoprazole (PROTONIX) 20 MG tablet Take 1 tablet (20 mg total) by mouth daily. 30 tablet 1  . polyvinyl alcohol (LIQUIFILM TEARS) 1.4 % ophthalmic solution Place 1 drop into both eyes daily as needed for dry eyes. Reported  on 11/19/2015     No current facility-administered medications on file prior to visit.     Past Medical History:  Diagnosis Date  . Allergy    SEASONAL  . Arthritis   . Asthma   . BPH (benign prostatic hypertrophy)   . GERD (gastroesophageal reflux disease)   . History of kidney stones   . History of myasthenia gravis    15 yrs ago - no current problem or treatment  . Hypertension   . Mild carotid artery disease (HCC)    bilateral  ICA 0-39%  per duplex 02-16-2013  . Multiple pulmonary nodules    per last CT stable  . Nephrolithiasis    BILATERAL   Allergies  Allergen Reactions  . Lisinopril Swelling    Social History   Socioeconomic History  . Marital status: Married    Spouse name: Not on file  . Number of children: Not on file  . Years of education: Not on file  . Highest education level: Not on file  Occupational History  . Not on file  Tobacco Use  . Smoking status: Never Smoker  . Smokeless tobacco: Never Used  Vaping Use  . Vaping Use: Never used  Substance and Sexual Activity  . Alcohol use: No    Alcohol/week: 0.0 standard drinks  . Drug use: No  . Sexual activity: Not on file  Other Topics Concern  . Not on file  Social History Narrative  . Not on file   Social Determinants of Health   Financial Resource Strain: Not on file  Food Insecurity: Not on file  Transportation Needs: Not on file  Physical Activity: Not on file  Stress: Not on file  Social Connections: Not on file    Vitals:   04/03/21 1057  BP: 136/80  Pulse: 65  Resp: 16  Temp: 98.5 F (36.9 C)  SpO2: 98%   Body mass index is 24.92 kg/m.  Physical Exam Vitals and nursing note reviewed.  Constitutional:      General: He is not in acute distress.    Appearance: He is well-developed.  HENT:     Head: Normocephalic and atraumatic.     Mouth/Throat:     Mouth: Mucous membranes are moist.     Dentition: Has dentures.  Eyes:     Conjunctiva/sclera: Conjunctivae normal.  Cardiovascular:     Rate and Rhythm: Normal rate and regular rhythm.     Heart sounds: Murmur (SEM I/VI RUSB) heard.   Systolic murmur is present with a grade of 1/6.     Comments: PT pulse present,bilateral. Pulmonary:     Effort: Pulmonary effort is normal. No respiratory distress.     Breath sounds: Normal breath sounds.  Abdominal:     Palpations: Abdomen is soft. There is no hepatomegaly or mass.     Tenderness: There is no  abdominal tenderness.  Lymphadenopathy:     Cervical: No cervical adenopathy.  Skin:    General: Skin is warm.     Findings: No erythema or rash.  Neurological:     Mental Status: He is alert and oriented to person, place, and time.     Cranial Nerves: No cranial nerve deficit.     Gait: Gait normal.  Psychiatric:     Comments: Well groomed, good eye contact.    ASSESSMENT AND PLAN:  [name] was seen today for 3 months follow-up.  Essential hypertension Because he is still having side effects with Amlodipine, it  was d/c. Metoprolol succinate 25 mg added today. No changes in Losartan dose. Continue low salt diet. Continue monitoring BP at home. Strongly recommend arranging eye exam with his eye care provider.  Thoracic aortic aneurysm (HCC) Asymptomatic. Reviewed chest CT report and we will plan on chest CT in 01/2022. Metoprolol succinate added today, Clearly instructed about warning signs.  GERD Improved. Continue PPI daily prn. GERD precautions to continue.  Aortic atherosclerosis (HCC) We discussed chest CT findings and CV benefits of stains. Crestor 10 mg added today. Continue Aspirin 81 mg daily, some side effects discussed.   Return in about 4 months (around 08/04/2021) for Cholesterol next visait.Marland Kitchen   Vaunda Gutterman G. [place], MD  [facility]. [place] office.    A few things to remember from today's visit:   Essential hypertension  Abdominal aortic atherosclerosis [facility])  Thoracic aortic aneurysm without rupture [facility])  If you need refills please call your pharmacy. Do not use My Chart to request refills or for acute issues that need immediate attention.   Today Amlodipine was stopped. Metoprolol succinate was added, start with 1/2 tab and increase to whole tab in a week if well tolerated. Monitor blood pressure, goal is 120's/70's. Monitor heart rate. No changes in Losartan. Continue Pantoprazole 20 mg as needed.  Let me know  about blood pressure and heart rate numbers in 4 weeks.   Please be sure medication list is accurate. If a new problem present, please set up appointment sooner than planned today.

## 2021-04-03 NOTE — Assessment & Plan Note (Signed)
Improved. Continue PPI daily prn. GERD precautions to continue.

## 2021-04-03 NOTE — Patient Instructions (Signed)
A few things to remember from today's visit:   Essential hypertension  Abdominal aortic atherosclerosis [facility])  Thoracic aortic aneurysm without rupture [facility])  If you need refills please call your pharmacy. Do not use My Chart to request refills or for acute issues that need immediate attention.   Today Amlodipine was stopped. Metoprolol succinate was added, start with 1/2 tab and increase to whole tab in a week if well tolerated. Monitor blood pressure, goal is 120's/70's. Monitor heart rate. No changes in Losartan. Continue Pantoprazole 20 mg as needed.  Let me know about blood pressure and heart rate numbers in 4 weeks.   Please be sure medication list is accurate. If a new problem present, please set up appointment sooner than planned today.

## 2021-04-03 NOTE — Assessment & Plan Note (Addendum)
Asymptomatic. Reviewed chest CT report and we will plan on chest CT in 01/2022. Metoprolol succinate added today, Clearly instructed about warning signs.

## 2021-04-03 NOTE — Assessment & Plan Note (Addendum)
Because he is still having side effects with Amlodipine, it was d/c. Metoprolol succinate 25 mg added today. No changes in Losartan dose. Continue low salt diet. Continue monitoring BP at home. Strongly recommend arranging eye exam with his eye care provider.

## 2021-04-03 NOTE — Assessment & Plan Note (Addendum)
We discussed chest CT findings and CV benefits of stains. Crestor 10 mg added today. Continue Aspirin 81 mg daily, some side effects discussed.

## 2021-04-04 ENCOUNTER — Encounter: Payer: Self-pay | Admitting: Family Medicine

## 2021-04-14 ENCOUNTER — Telehealth: Payer: Self-pay | Admitting: Family Medicine

## 2021-04-14 NOTE — Telephone Encounter (Signed)
Okay for pt to hold new medication until after he gets his booster vaccine?

## 2021-04-14 NOTE — Telephone Encounter (Signed)
The patient last seen Dr. [place] on 05/20 and she changed his medications and he received them by mail on Wednesday. He hasn't started taking the medication yet or stopped the old medication because he is getting ready to have his 4th booster shot. His concern is he don't want to get the shot and change medications at the same time and start having side effects and not know if they were from the shot or from the change in medication.  Please advise

## 2021-04-15 NOTE — Telephone Encounter (Signed)
It is ok to start new medication after COVID 19 booster but do not wait too long. Thanks, BJ

## 2021-04-15 NOTE — Telephone Encounter (Signed)
I spoke with pt, we went over message below & he verbalized understanding.

## 2021-04-28 ENCOUNTER — Telehealth: Payer: Self-pay | Admitting: Family Medicine

## 2021-04-28 NOTE — Telephone Encounter (Signed)
Patient is calling and stated that he started a new medication metoprolol succinate (TOPROL-XL) 25 MG 24 hr tablet and has been causing him to have a low heart rate. Patient wanted to see what provider recommends, please advise. CB is [phone]

## 2021-04-29 ENCOUNTER — Ambulatory Visit (INDEPENDENT_AMBULATORY_CARE_PROVIDER_SITE_OTHER): Payer: Medicare Other | Admitting: Family Medicine

## 2021-04-29 ENCOUNTER — Other Ambulatory Visit: Payer: Self-pay

## 2021-04-29 ENCOUNTER — Telehealth: Payer: Self-pay | Admitting: Family Medicine

## 2021-04-29 ENCOUNTER — Encounter: Payer: Self-pay | Admitting: Family Medicine

## 2021-04-29 VITALS — BP 152/72 | HR 74 | Temp 98.7°F | Resp 16 | Ht 64.0 in | Wt 148.4 lb

## 2021-04-29 DIAGNOSIS — I10 Essential (primary) hypertension: Secondary | ICD-10-CM

## 2021-04-29 DIAGNOSIS — J4520 Mild intermittent asthma, uncomplicated: Secondary | ICD-10-CM

## 2021-04-29 MED ORDER — LOSARTAN POTASSIUM 50 MG PO TABS: 100.0000 mg | ORAL_TABLET | Freq: Every day | ORAL | 0 refills | Status: DC

## 2021-04-29 MED ORDER — ALBUTEROL SULFATE HFA 108 (90 BASE) MCG/ACT IN AERS: 2.0000 | INHALATION_SPRAY | Freq: Four times a day (QID) | RESPIRATORY_TRACT | 2 refills | Status: DC | PRN

## 2021-04-29 NOTE — Patient Instructions (Addendum)
A few things to remember from today's visit:   Essential hypertension  Mild intermittent asthma without complication  If you need refills please call your pharmacy. Do not use My Chart to request refills or for acute issues that need immediate attention.   Today we are increasing dose of Losartan from 50 mg to 100 mg. In 3-4 weeks please let me know about blood pressure readings. Please arrange appt with your eye Dr.  Please be sure medication list is accurate. If a new problem present, please set up appointment sooner than planned today.

## 2021-04-29 NOTE — Progress Notes (Signed)
Chief Complaint  Patient presents with   Medication Problem    Metoprolol cause BP and heart rate to go down   HPI: [name] is a 86 y.o. male, who is here today complaining of side effects with metoprolol succinate as described above. He was last seen on 04/03/2021, when amlodipine was discontinued because it was causing side effects, blurry vision.  Metoprolol succinate was added to losartan 50 mg daily. He started metoprolol succinate 25 mg 1/2 tablet, noted BP elevation and bradycardia, HR in the low 50s. He repeated dose the next day, HR in the mid 40s. Since he stopped metoprolol succinate his HR has been in normal range, BP 130s/80s-90s. He has not noted headache, visual changes, CP, palpitations, diaphoresis, focal neurologic deficit, orthopnea, PND, or edema.  Lab Results  Component Value Date   CREATININE 1.07 12/30/2020   BUN 13 12/30/2020   NA 141 12/30/2020   K 4.4 12/30/2020   CL 104 12/30/2020   CO2 31 12/30/2020   Last visit Crestor 10 mg was also recommended, he has not started medication yet.  He also needs a refill on albuterol inhaler, which he uses as needed for asthma exacerbation. He has not needed albuterol in a while. Currently he is on Flovent 44 mcg 1 puff twice daily he has tried to take it as needed in the past but has had asthma exacerbation after 2 to 3 days. Negative for cough, wheezing, or dyspnea. He is also on Singulair 10 mg daily.  Review of Systems  Constitutional:  Negative for activity change, appetite change, fatigue and fever.  HENT:  Negative for nosebleeds, sore throat and trouble swallowing.   Gastrointestinal:  Negative for abdominal pain, nausea and vomiting.  Genitourinary:  Negative for decreased urine volume and hematuria.  Musculoskeletal:  Negative for gait problem and myalgias.  Neurological:  Negative for syncope, facial asymmetry, weakness and headaches.  Psychiatric/Behavioral:  Negative for confusion.    Rest see pertinent positives and negatives per HPI.  Current Outpatient Medications on File Prior to Visit  Medication Sig Dispense Refill   aspirin 81 MG EC tablet Take 81 mg by mouth every morning.     calcium carbonate (TUMS - DOSED IN MG ELEMENTAL CALCIUM) 500 MG chewable tablet Chew 1 tablet by mouth as needed for indigestion or heartburn. Reported on 11/19/2015     FLOVENT HFA 44 MCG/ACT inhaler INHALE 1 PUFF BY MOUTH INTO THE LUNGS TWICE DAILY 21.2 g 2   hydrocortisone (ANUSOL-HC) 25 MG suppository Place 1 suppository (25 mg total) rectally 2 (two) times daily. 12 suppository 0   montelukast (SINGULAIR) 10 MG tablet TAKE 1 TABLET BY MOUTH EVERY NIGHT AT BEDTIME GENERIC EQUIVALENT FOR SINGULAIR 90 tablet 1   Multiple Vitamin (MULTIVITAMIN) tablet Take 1 tablet by mouth every morning. Reported on 11/19/2015     Omega-3 Fatty Acids (FISH OIL) 1200 MG CAPS Take 1,200 mg by mouth every morning.     pantoprazole (PROTONIX) 20 MG tablet Take 1 tablet (20 mg total) by mouth daily. 30 tablet 1   polyvinyl alcohol (LIQUIFILM TEARS) 1.4 % ophthalmic solution Place 1 drop into both eyes daily as needed for dry eyes. Reported on 11/19/2015     rosuvastatin (CRESTOR) 10 MG tablet Take 1 tablet (10 mg total) by mouth daily. (Patient not taking: Reported on 04/29/2021) 90 tablet 3   No current facility-administered medications on file prior to visit.     Past Medical History:  Diagnosis  Date   Allergy    SEASONAL   Arthritis    Asthma    BPH (benign prostatic hypertrophy)    GERD (gastroesophageal reflux disease)    History of kidney stones    History of myasthenia gravis    15 yrs ago - no current problem or treatment   Hypertension    Mild carotid artery disease (HCC)    bilateral ICA 0-39%  per duplex 02-16-2013   Multiple pulmonary nodules    per last CT stable   Nephrolithiasis    BILATERAL   Allergies  Allergen Reactions   Lisinopril Swelling    Social History   Socioeconomic  History   Marital status: Married    Spouse name: Not on file   Number of children: Not on file   Years of education: Not on file   Highest education level: Not on file  Occupational History   Not on file  Tobacco Use   Smoking status: Never   Smokeless tobacco: Never  Vaping Use   Vaping Use: Never used  Substance and Sexual Activity   Alcohol use: No    Alcohol/week: 0.0 standard drinks   Drug use: No   Sexual activity: Not on file  Other Topics Concern   Not on file  Social History Narrative   Not on file   Social Determinants of Health   Financial Resource Strain: Not on file  Food Insecurity: Not on file  Transportation Needs: Not on file  Physical Activity: Not on file  Stress: Not on file  Social Connections: Not on file   Vitals:   04/29/21 1643  BP: (!) 152/72  Pulse: 74  Resp: 16  Temp: 98.7 F (37.1 C)  SpO2: 98%   Body mass index is 25.47 kg/m.  Physical Exam Vitals and nursing note reviewed.  Constitutional:      General: He is not in acute distress.    Appearance: He is well-developed.  HENT:     Head: Normocephalic and atraumatic.     Mouth/Throat:     Mouth: Mucous membranes are moist.     Dentition: Has dentures.  Eyes:     Conjunctiva/sclera: Conjunctivae normal.  Cardiovascular:     Rate and Rhythm: Normal rate and regular rhythm.     Pulses:          Dorsalis pedis pulses are 2+ on the right side and 2+ on the left side.     Heart sounds: Murmur (SEM I-/VI RUSB) heard.  Pulmonary:     Effort: Pulmonary effort is normal. No respiratory distress.     Breath sounds: Normal breath sounds.  Abdominal:     Palpations: Abdomen is soft. There is no hepatomegaly or mass.     Tenderness: There is no abdominal tenderness.  Lymphadenopathy:     Cervical: No cervical adenopathy.  Skin:    General: Skin is warm.     Findings: No erythema or rash.  Neurological:     General: No focal deficit present.     Mental Status: He is alert and  oriented to person, place, and time.     Gait: Gait normal.  Psychiatric:     Comments: Well groomed, good eye contact.   ASSESSMENT AND PLAN:  [name] was seen today for medication problem.  Diagnoses and all orders for this visit:  Essential hypertension BP mildly elevated today. We discussed possible complications of elevated BP. Losartan increased from 50 mg to 100 mg. Continue low-salt diet and  monitoring BP regularly. Instructed to let me know about BP number in 3 to 4 weeks, before if needed. Keep next appointment. Instructed about warning signs.  -     losartan (COZAAR) 50 MG tablet; Take 2 tablets (100 mg total) by mouth daily.  Mild intermittent asthma without complication Problem is well controlled. Continue Flovent 44 mcg 1 puff twice daily and Singulair 10 mg daily. Continue albuterol inhaler 2 puff 4 times daily as needed.  -     albuterol (VENTOLIN HFA) 108 (90 Base) MCG/ACT inhaler; Inhale 2 puffs into the lungs every 6 (six) hours as needed for wheezing or shortness of breath.   Return if symptoms worsen or fail to improve, for Keep next appt.   Shayana Hornstein G. [place], MD  [facility] [place]. [place] office.   A few things to remember from today's visit:   Essential hypertension  Mild intermittent asthma without complication  If you need refills please call your pharmacy. Do not use My Chart to request refills or for acute issues that need immediate attention.   Today we are increasing dose of Losartan from 50 mg to 100 mg. In 3-4 weeks please let me know about blood pressure readings. Please arrange appt with your eye Dr.  Please be sure medication list is accurate. If a new problem present, please set up appointment sooner than planned today.

## 2021-04-29 NOTE — Chronic Care Management (AMB) (Signed)
  Chronic Care Management   Note  04/29/2021 Name: [name] MRN: [id number] DOB: 01/12/1935  [name] is a 86 y.o. year old male who is a primary care patient of [place], Timoteo Expose, MD. I reached out to [name] by phone today in response to a referral sent by [name]'s PCP, [place], [name], MD.   [name] was given information about Chronic Care Management services today including:  CCM service includes personalized support from designated clinical staff supervised by his physician, including individualized plan of care and coordination with other care providers 24/7 contact phone numbers for assistance for urgent and routine care needs. Service will only be billed when office clinical staff spend 20 minutes or more in a month to coordinate care. Only one practitioner may furnish and bill the service in a calendar month. The patient may stop CCM services at any time (effective at the end of the month) by phone call to the office staff.   Patient agreed to services and verbal consent obtained.   Follow up plan:   [employer], [employer]

## 2021-05-06 ENCOUNTER — Other Ambulatory Visit: Payer: Self-pay | Admitting: Family Medicine

## 2021-05-06 DIAGNOSIS — K219 Gastro-esophageal reflux disease without esophagitis: Secondary | ICD-10-CM

## 2021-05-19 ENCOUNTER — Encounter: Payer: Self-pay | Admitting: Family Medicine

## 2021-05-19 ENCOUNTER — Telehealth: Payer: Self-pay | Admitting: Family Medicine

## 2021-05-19 DIAGNOSIS — I10 Essential (primary) hypertension: Secondary | ICD-10-CM

## 2021-05-19 MED ORDER — LOSARTAN POTASSIUM 50 MG PO TABS: 100.0000 mg | ORAL_TABLET | Freq: Every day | ORAL | 2 refills | Status: DC

## 2021-05-19 NOTE — Telephone Encounter (Signed)
Rx sent in

## 2021-05-19 NOTE — Telephone Encounter (Signed)
[name]  from [employer] shield call and stated pt need a refill on  losartan (COZAAR) 50 MG tablet  to time a day sent to  [facility] SERVICE) [facility] - [place], [place] - [address] Phone:  [phone]  Fax:  [phone]

## 2021-05-22 NOTE — Telephone Encounter (Signed)
Amlodipine was discontinued because he thought it was causing blurry vision, which he is still having. He did not tolerate Metorpolol, so we do not have many good options. Can we go back to Amlodipine 5 mg daily at bedtime?It does not seem like Amlodipine was the reason for blurry vision. Follow with eye care provider. Thanks, BJ

## 2021-06-08 ENCOUNTER — Telehealth: Payer: Self-pay | Admitting: Pharmacist

## 2021-06-08 NOTE — Chronic Care Management (AMB) (Addendum)
Chronic Care Management Pharmacy Assistant   Name: HUSEIN GUEDES  MRN: [id number] DOB: 01/26/1935  Reason for Encounter: Chart Review for CPP visit on 07.28.2022   Conditions to be addressed/monitored: HTN and Asthma and GERD  Primary concerns for visit include:   Recent office visits:  06.15.2022 [place], [name], MD seen for medication problem: Losartan 50 mg increased to 100 mg daily and metoprolol succinate (TOPROL-XL) 25 MG 24 discontinued  05.20.2022 [place], [name], MD 48-month follow-up: medication Metoprolol Succinate 25 mg Oral Daily and Rosuvastatin Calcium 10 mg Oral Daily. Discontinued amlodipine (NORVASC) 5 MG tablet. 04.22.2022 Deeann Saint, MD acute visit seen for Acid reflux: pantoprazole (PROTONIX) 20 MG tablet 02.15.2022 [place], [name], MD routine physical; amlodipine changed to 5 mg at bedtime  Recent consult visits:  None  Hospital visits:  None in previous 6 months  Medications: Outpatient Encounter Medications as of 06/08/2021  Medication Sig   albuterol (VENTOLIN HFA) 108 (90 Base) MCG/ACT inhaler Inhale 2 puffs into the lungs every 6 (six) hours as needed for wheezing or shortness of breath.   aspirin 81 MG EC tablet Take 81 mg by mouth every morning.   calcium carbonate (TUMS - DOSED IN MG ELEMENTAL CALCIUM) 500 MG chewable tablet Chew 1 tablet by mouth as needed for indigestion or heartburn. Reported on 11/19/2015   FLOVENT HFA 44 MCG/ACT inhaler INHALE 1 PUFF BY MOUTH INTO THE LUNGS TWICE DAILY   hydrocortisone (ANUSOL-HC) 25 MG suppository Place 1 suppository (25 mg total) rectally 2 (two) times daily.   losartan (COZAAR) 50 MG tablet Take 2 tablets (100 mg total) by mouth daily.   montelukast (SINGULAIR) 10 MG tablet TAKE 1 TABLET BY MOUTH EVERY NIGHT AT BEDTIME GENERIC EQUIVALENT FOR SINGULAIR   Multiple Vitamin (MULTIVITAMIN) tablet Take 1 tablet by mouth every morning. Reported on 11/19/2015   Omega-3 Fatty Acids (FISH OIL) 1200 MG CAPS Take  1,200 mg by mouth every morning.   pantoprazole (PROTONIX) 20 MG tablet TAKE 1 TABLET BY MOUTH EVERY DAY   polyvinyl alcohol (LIQUIFILM TEARS) 1.4 % ophthalmic solution Place 1 drop into both eyes daily as needed for dry eyes. Reported on 11/19/2015   rosuvastatin (CRESTOR) 10 MG tablet Take 1 tablet (10 mg total) by mouth daily. (Patient not taking: Reported on 04/29/2021)   No facility-administered encounter medications on file as of 06/08/2021.  Patient Questions:   Have you seen any other providers since your last visit? No Any changes in your medications or health? No Any side effects from any medications? No Do you have an symptoms or problems not managed by your medications?  BP  still not normal Any concerns about your health right now? NO Has your provider asked that you check blood pressure, blood sugar, or follow special diet at home?  Checks BP Daily Do you get any type of exercise on a regular basis?  Yard Work Can you think of a goal you would like to reach for your health?  No Do you have any problems getting your medications? No Is there anything that you would like to discuss during the appointment? No   Date- Patient called to remind of appointment with Sheran Lawless on 07.28.2022 at 11:00 am  Patient aware of appointment date, time, and type of appointment (either telephone or in person). Patient aware to have/bring all medications, supplements, blood pressure and/or blood sugar logs to visit.  Care Gaps: Zoster Vaccines Covid-19 4th Booster  Star Rating Drugs: Medication Dispensed  Quantity Pharmacy  Rosuvastatin 10 mg 05.20.2022 90 [facility]  Losartan 50 mg 07.06.2022 [facility]   Amilia Tomma Rakers, CMA Clinical Pharmacist Assistant [phone]

## 2021-06-11 ENCOUNTER — Ambulatory Visit (INDEPENDENT_AMBULATORY_CARE_PROVIDER_SITE_OTHER): Payer: Medicare Other | Admitting: Pharmacist

## 2021-06-11 ENCOUNTER — Other Ambulatory Visit: Payer: Self-pay

## 2021-06-11 VITALS — BP 130/86

## 2021-06-11 DIAGNOSIS — I10 Essential (primary) hypertension: Secondary | ICD-10-CM | POA: Diagnosis not present

## 2021-06-11 DIAGNOSIS — I700 Atherosclerosis of aorta: Secondary | ICD-10-CM

## 2021-06-11 NOTE — Progress Notes (Signed)
Chronic Care Management Pharmacy Note  06/11/2021 Name:  [name] MRN:  [id number] DOB:  28-Oct-1935  Summary: LDL not at goal < 70 BP not at goal < 140/90 per office or home readings  Recommendations/Changes made from today's visit: -Updated pharmacy as patient had some trouble with [facility] and wants to switch to [facility] for acute needs -Recommend addition of chlorthalidone for BP lowering and kidney stones benefit -Recommend switching rosuvastatin to atorvastatin due to patient's renal concerns  Plan: Follow up BP assessment in 2-3 weeks   Subjective: [name] is an 86 y.o. year old male who is a primary patient of [name], [name], MD.  The CCM team was consulted for assistance with disease management and care coordination needs.    Engaged with patient face to face for initial visit in response to provider referral for pharmacy case management and/or care coordination services.   Consent to Services:  The patient was given the following information about Chronic Care Management services today, agreed to services, and gave verbal consent: 1. CCM service includes personalized support from designated clinical staff supervised by the primary care provider, including individualized plan of care and coordination with other care providers 2. 24/7 contact phone numbers for assistance for urgent and routine care needs. 3. Service will only be billed when office clinical staff spend 20 minutes or more in a month to coordinate care. 4. Only one practitioner may furnish and bill the service in a calendar month. 5.The patient may stop CCM services at any time (effective at the end of the month) by phone call to the office staff. 6. The patient will be responsible for cost sharing (co-pay) of up to 20% of the service fee (after annual deductible is met). Patient agreed to services and consent obtained.  Patient Care Team: [name], [name], MD as PCP - General (Family  Medicine) [name], [facility] as Pharmacist (Pharmacist)  Recent office visits: 05/19/21 Patient message: Patient decreased his losartan to 50 mg on his own.  04/29/21 [name], [name], MD: Patient presented for HTN follow up. Increased losartan to 100 mg daily and discontinued metoprolol succinate 25 MG.  04/03/21 [name], [name], MD: Patient presented for a 80-monthfollow-up for HTN. Prescribed metoprolol succinate 25 mg and rosuvastatin 10 mg daily. Discontinued amlodipine.  03/06/21 BBillie Ruddy MD acute visit seen for Acid reflux: Prescribed pantoprazole 20 MG tablet as needed.  12/30/20 JMartinique [name], MD: Patient presented for routine physical. Decreased amlodipine to 5 mg and instructed to take at bedtime.   Recent consult visits: None  Hospital visits: None in previous 6 months   Objective:  Lab Results  Component Value Date   CREATININE 1.07 12/30/2020   BUN 13 12/30/2020   GFR 64.28 12/30/2020   GFRNONAA 63 08/01/2020   GFRAA 73 08/01/2020   NA 141 12/30/2020   K 4.4 12/30/2020   CALCIUM 10.8 (H) 12/30/2020   CO2 31 12/30/2020   GLUCOSE 82 12/30/2020    Lab Results  Component Value Date/Time   GFR 64.28 12/30/2020 12:20 PM   GFR 78.30 01/28/2020 08:46 AM    Last diabetic Eye exam: No results found for: HMDIABEYEEXA  Last diabetic Foot exam: No results found for: HMDIABFOOTEX   Lab Results  Component Value Date   CHOL 175 08/01/2020   HDL 52 08/01/2020   LDLCALC 103 (H) 08/01/2020   LDLDIRECT 140.4 05/10/2007   TRIG 100 08/01/2020   CHOLHDL 3.4 08/01/2020  Hepatic Function Latest Ref Rng & Units 08/01/2020 06/19/2019 12/13/2017  Total Protein 6.1 - 8.1 g/dL 7.1 7.2 7.2  Albumin 3.5 - 5.2 g/dL - 4.4 4.0  AST 10 - 35 U/L 24 25 48(H)  ALT 9 - 46 U/L _0 Alk Phosphatase 39 - 117 U/L - 78 84  Total Bilirubin 0.2 - 1.2 mg/dL 0.3 0.5 1.2  Bilirubin, Direct 0.0 - 0.3 mg/dL - - -    Lab Results  Component Value Date/Time   TSH 2.08 12/05/2017  11:42 AM   TSH 1.42 12/06/2016 10:24 AM    CBC Latest Ref Rng & Units 06/19/2019 12/13/2017 12/05/2017  WBC 4.0 - 10.5 K/uL 6.7 10.3 4.7  Hemoglobin 13.0 - 17.0 g/dL 14.0 15.1 15.0  Hematocrit 39.0 - 52.0 % 41.3 43.1 44.5  Platelets 150.0 - 400.0 K/uL 204.0 144(L) 222.0    Lab Results  Component Value Date/Time   VD25OH 37.05 12/30/2020 12:20 PM   VD25OH 39.53 01/28/2020 08:46 AM    Clinical ASCVD: Yes  The ASCVD Risk score ([place]., et al., 2013) failed to calculate for the following reasons:   The 2013 ASCVD risk score is only valid for ages 37 to 31    Depression screen PHQ 2/9 08/01/2020 06/19/2019 04/06/2017  Decreased Interest 0 0 0  Down, Depressed, Hopeless 0 0 0  PHQ - 2 Score 0 0 0     Social History   Tobacco Use  Smoking Status Never  Smokeless Tobacco Never   BP Readings from Last 3 Encounters:  04/29/21 (!) 152/72  04/03/21 136/80  03/06/21 122/86   Pulse Readings from Last 3 Encounters:  04/29/21 74  04/03/21 65  03/06/21 83   Wt Readings from Last 3 Encounters:  04/29/21 148 lb 6.4 oz (67.3 kg)  04/03/21 145 lb 3.2 oz (65.9 kg)  03/06/21 146 lb 6.4 oz (66.4 kg)   BMI Readings from Last 3 Encounters:  04/29/21 25.47 kg/m  04/03/21 24.92 kg/m  03/06/21 25.13 kg/m    Assessment/Interventions: Review of patient past medical history, allergies, medications, health status, including review of consultants reports, laboratory and other test data, was performed as part of comprehensive evaluation and provision of chronic care management services.   SDOH:  (Social Determinants of Health) assessments and interventions performed: Yes SDOH Interventions    Flowsheet Row Most Recent Value  SDOH Interventions   Financial Strain Interventions Intervention Not Indicated  Transportation Interventions Intervention Not Indicated      SDOH Screenings   Alcohol Screen: Not on file  Depression (PHQ2-9): Low Risk    PHQ-2 Score: 0  Financial Resource  Strain: Low Risk    Difficulty of Paying Living Expenses: Not hard at all  Food Insecurity: Not on file  Housing: Not on file  Physical Activity: Not on file  Social Connections: Not on file  Stress: Not on file  Tobacco Use: Low Risk    Smoking Tobacco Use: Never   Smokeless Tobacco Use: Never  Transportation Needs: No Transportation Needs   Lack of Transportation (Medical): No   Lack of Transportation (Non-Medical): No   Patient has been busy for the past 3 years working on remodeling parts of his home and working on repairs around his house. This includes the inside and outside and tends to spend a few hours every day. He doesn't do any structured exercise but is moving around throughout the day.  Patient tries to eat vegetables every day but sometimes strays from  that. His meals usually include a vegetable, a starch and a meat but doesn't always eat meat. When he does, he is eating more chicken. He does get takeout some, which includes salads, hamburgers, and [place] food about 3 times a week. He does eat some desserts including ice cream and pudding but not every day. He drinks 3-5 16 ounce bottles of water per day, a couple glasses of sweet tea, and 6 ounces of coffee in morning.  Patient is sleeping pretty well and goes to bed around 12 and gets up between 7-8am. He feels rested and takes a nap almost every day but this doesn't interrupt his sleep schedule at night.   He is having trouble getting his blood pressure under control and is still having blurry vision on and off from the blood pressure medications. He does have an eye doctors appointment scheduled for August 2nd.   CCM Care Plan  Allergies  Allergen Reactions   Lisinopril Swelling    Medications Reviewed Today     Reviewed by [name], [facility] (Pharmacist) on 06/11/21 at 1140  Med List Status: <None>   Medication Order Taking? Sig Documenting Provider Last Dose Status Informant  albuterol (VENTOLIN HFA) 108  (90 Base) MCG/ACT inhaler [id number] Yes Inhale 2 puffs into the lungs every 6 (six) hours as needed for wheezing or shortness of breath. [name], [name], MD Taking Active   aspirin 81 MG EC tablet [id number] Yes Take 81 mg by mouth every morning. [provider] Taking Active Self  calcium carbonate (TUMS - DOSED IN MG ELEMENTAL CALCIUM) 500 MG chewable tablet [id number] Yes Chew 1 tablet by mouth as needed for indigestion or heartburn. Reported on 11/19/2015 [provider] Taking Active Self  FLOVENT HFA 44 MCG/ACT inhaler [id number] Yes INHALE 1 PUFF BY MOUTH INTO THE LUNGS TWICE DAILY [name], [name], MD Taking Active   losartan (COZAAR) 50 MG tablet [id number] Yes Take 2 tablets (100 mg total) by mouth daily. [name], [name], MD Taking Active   montelukast (SINGULAIR) 10 MG tablet [id number] Yes TAKE 1 TABLET BY MOUTH EVERY NIGHT AT BEDTIME GENERIC EQUIVALENT FOR Laurine Blazer [name], [name], MD Taking Active   Multiple Vitamin (MULTIVITAMIN) tablet [id number] Yes Take 1 tablet by mouth every morning. Reported on 11/19/2015 [provider] Taking Active Self  Omega-3 Fatty Acids (FISH OIL) 1200 MG CAPS [id number] Yes Take 1,200 mg by mouth every morning. [provider] Taking Active Self  pantoprazole (PROTONIX) 20 MG tablet [id number] Yes TAKE 1 TABLET BY MOUTH EVERY DAY Billie Ruddy, MD Taking Active   polyvinyl alcohol (LIQUIFILM TEARS) 1.4 % ophthalmic solution [id number] Yes Place 1 drop into both eyes daily as needed for dry eyes. Reported on 11/19/2015 [provider] Taking Active Self  rosuvastatin (CRESTOR) 10 MG tablet [id number] No Take 1 tablet (10 mg total) by mouth daily.  Patient not taking: No sig reported   [name], [name], MD Not Taking Active             Patient Active Problem List   Diagnosis Date Noted   Vitamin D deficiency, unspecified 12/30/2020   Thoracic aortic aneurysm ([place]) 08/01/2020   (HFpEF) heart failure with preserved ejection  fraction ([place]) 08/01/2020   Heart murmur 01/28/2020   Elevated PSA 01/28/2020   Hx of angioedema 11/26/2019   Aortic atherosclerosis ([place]) 11/26/2019   Dyslipidemia (high LDL; low HDL) 06/18/2019   Routine general medical examination at a health care facility 12/05/2017  Hypercalcemia 11/19/2015   Family history of prostate cancer 09/10/2015   Recurrent kidney stones 03/18/2015   History of multiple pulmonary nodules 04/10/2014   ERECTILE DYSFUNCTION, ORGANIC 05/16/2009   Osteoarthritis, multiple sites 11/20/2008   MIGRAINE, OPHTHALMIC 03/21/2007   Essential hypertension 03/21/2007   Allergic rhinitis 03/21/2007   Asthma 03/21/2007   GERD 03/21/2007   HERNIA 03/21/2007    Immunization History  Administered Date(s) Administered   Fluad Quad(high Dose 65+) 10/02/2019, 08/01/2020   Influenza Whole 10/14/2004, 08/18/2007, 08/28/2008, 09/08/2009, 08/20/2010   Influenza, High Dose Seasonal PF 08/27/2013, 09/10/2015, 12/06/2016, 08/25/2017, 09/18/2018   Influenza,inj,Quad PF,6+ Mos 08/29/2014   PFIZER(Purple Top)SARS-COV-2 Vaccination 01/19/2020, 02/19/2020, 08/21/2020, 04/19/2021   Pneumococcal Conjugate-13 08/29/2014   Pneumococcal Polysaccharide-23 02/18/2005   Td 11/15/1998, 05/13/2008   Zoster, Live 06/03/2009    Conditions to be addressed/monitored:  Hypertension, Hyperlipidemia, GERD, Asthma, and Allergic Rhinitis  Care Plan : [place]  Updates made by [name], [place] since 06/11/2021 12:00 AM     Problem: Problem: Hypertension, Hyperlipidemia, GERD, Asthma, and Allergic Rhinitis      Long-Range Goal: Patient-Specific Goal   Start Date: 06/11/2021  Expected End Date: 06/11/2022  This Visit's Progress: On track  Priority: High  Note:   Current Barriers:  Unable to achieve control of blood pressure  Suboptimal therapeutic regimen for cholesterol  Pharmacist Clinical Goal(s):  Patient will achieve control of blood pressure as evidenced by home  and office blood pressure readings adhere to plan to optimize therapeutic regimen for cholesterol as evidenced by report of adherence to recommended medication management changes through collaboration with PharmD and provider.   Interventions: 1:1 collaboration with [name], [name], MD regarding development and update of comprehensive plan of care as evidenced by provider attestation and co-signature Inter-disciplinary care team collaboration (see longitudinal plan of care) Comprehensive medication review performed; medication list updated in electronic medical record  Hypertension  (Status:Goal on track: NO.)   Med Management Intervention: Medication added  (BP goal <140/90) -Uncontrolled -Current treatment: Losartan 50 mg 2 tablets daily - taking 1 tablet -Medications previously tried: amlodipine (blurry vision), lisinopril-HCTZ   -Current home readings: 140-150s/74 (checking 3-4 times a week) -Current dietary habits: eating out more often; not limiting salt much; has switched to less salty foods; does not read package labels -Current exercise habits: more active with home repais -Denies hypotensive/hypertensive symptoms -Educated on BP goals and benefits of medications for prevention of heart attack, stroke and kidney damage; Exercise goal of 150 minutes per week; Importance of home blood pressure monitoring; Proper BP monitoring technique; -Counseled to monitor BP at home daily, document, and provide log at future appointments -Counseled on diet and exercise extensively Recommended to continue current medication Recommended the addition of chlorthalidone for better BP lowering  Hyperlipidemia: (LDL goal < 70) -Uncontrolled -Current treatment: Rosuvastatin 10 mg 1 tablet daily - not taking Fish oil 1200 mg every morning -Medications previously tried: none  -Current dietary patterns: limiting fried foods -Current exercise habits: active around the house -Educated on Cholesterol  goals;  Benefits of statin for ASCVD risk reduction; Importance of limiting foods high in cholesterol; Exercise goal of 150 minutes per week; -Counseled on diet and exercise extensively Counseled on risk vs benefit with fish oil and patient agreed to stop taking it. Recommended switching from rosuvastatin to atorvastatin for kidneys.  Aortic atherosclerosis (Goal: prevent heart events) -Not ideally controlled -Current treatment  Rosuvastatin 10 mg 1 tablet daily - not taking Aspirin 81 mg 1  tablet daily -Medications previously tried: none  -Recommended to continue current medication   Asthma (Goal: control symptoms) -Controlled -Current treatment  Flovent 44 mcg/act 1 puff twice daily Albuterol HFA as needed Montelukast 10 mg 1 tablet at bedtime -Medications previously tried: none  -Pulmonary function testing: n/a -Patient reports consistent use of maintenance inhaler -Frequency of rescue inhaler use: rare (once in 20 years) -Counseled on Proper inhaler technique; Benefits of consistent maintenance inhaler use -Recommended to continue current medication Counseled on importance of rinsing with water and spitting after each use of Flovent.  GERD (Goal: minimize symptoms) -Controlled -Current treatment  Pantoprazole 20 mg 1 tablet daily as needed Tums as needed -Medications previously tried: none  -Counseled on non-pharmacologic management of symptoms such as elevating the head of your bed, avoiding eating 2-3 hours before bed, avoiding triggering foods such as acidic, spicy, or fatty foods, eating smaller meals, and wearing clothes that are loose around the waist Recommended use of Tums for quick relief  Health Maintenance -Vaccine gaps: shingrix, tetanus -Current therapy:  Multivitamin 1 tablet daily Liquifilm tears as needed -Educated on Cost vs benefit of each product must be carefully weighed by individual consumer -Patient is satisfied with current therapy and denies  issues -Recommended to continue current medication  Patient Goals/Self-Care Activities Patient will:  - take medications as prescribed check blood pressure daily, document, and provide at future appointments target a minimum of 150 minutes of moderate intensity exercise weekly  Follow Up Plan: Face to Face appointment with care management team member scheduled for: 2 months       Medication Assistance: None required.  Patient affirms current coverage meets needs.  Compliance/Adherence/Medication fill history: Care Gaps: Shingrix, COVID booster  Star-Rating Drugs: Rosuvastatin 10 mg - last filled 04/03/21 for 90 ds at [facility] Losartan 50 mg - last filled 05/20/21 for 90 ds at [facility]  Patient's preferred pharmacy is:  [facility] [place], [place] [address] Phone: [phone] Fax: [phone]  [address], [place] [address] Phone: [phone] Fax: [phone]  Uses pill box? Yes - weekly Pt endorses 100% compliance -Rarely misses medications - maybe once a month  We discussed: Current pharmacy is preferred with insurance plan and patient is satisfied with pharmacy services Patient decided to: Continue current medication management strategy  Care Plan and Follow Up Patient Decision:  Patient agrees to Care Plan and Follow-up.  Plan: Face to Face appointment with care management team member scheduled for: 2 months  Jeni Salles, PharmD, [place] Pharmacist [place] [facility] [place] [phone]

## 2021-06-11 NOTE — Patient Instructions (Signed)
Hi [name],   It was great to get to meet you in person! Below is a summary of some of the topics we discussed.   Please reach out to me if you have any questions or need anything before our follow up!  Best, Maddie  Gaylord Shih, PharmD, [facility] Clinical Pharmacist [facility] [place] [phone]   Visit Information   Goals Addressed   None    There are no care plans to display for this patient.   [name] was given information about Chronic Care Management services today including:  CCM service includes personalized support from designated clinical staff supervised by his physician, including individualized plan of care and coordination with other care providers 24/7 contact phone numbers for assistance for urgent and routine care needs. Standard insurance, coinsurance, copays and deductibles apply for chronic care management only during months in which we provide at least 20 minutes of these services. Most insurances cover these services at 100%, however patients may be responsible for any copay, coinsurance and/or deductible if applicable. This service may help you avoid the need for more expensive face-to-face services. Only one practitioner may furnish and bill the service in a calendar month. The patient may stop CCM services at any time (effective at the end of the month) by phone call to the office staff.  Patient agreed to services and verbal consent obtained.   Patient verbalizes understanding of instructions provided today and agrees to view in MyChart.  Face to Face appointment with pharmacist scheduled for:   [name], [name]

## 2021-06-22 DIAGNOSIS — H52223 Regular astigmatism, bilateral: Secondary | ICD-10-CM | POA: Diagnosis not present

## 2021-06-23 ENCOUNTER — Telehealth: Payer: Self-pay

## 2021-06-23 NOTE — Telephone Encounter (Signed)
He tried Amlodipine ,Metoprolol,and HCTZ in the past and d/c because of side effects. Amlodipine was discontinued because blurry vision but it did not resolve after stopping medication, can he try again? Chlorthalidone is a medication similar to HCTZ, it is another option. We can also change Losartan for a stronger medication like Benicar 40 mg or Avapro 300 mg. Thanks, BJ

## 2021-06-23 NOTE — Telephone Encounter (Signed)
I tried contacting patient, unable to leave a message.

## 2021-06-23 NOTE — Telephone Encounter (Signed)
I called and spoke with patient. He has some Amlodipine still, so he is going to start taking that again. He will record his BPs for a few days and let us know how they are running.

## 2021-06-23 NOTE — Telephone Encounter (Signed)
Patient called in to report that his blood pressure is still running high, despite taking the Losartan. He is wanting to know if something else needs to be added?

## 2021-06-30 ENCOUNTER — Telehealth: Payer: Self-pay | Admitting: Pharmacist

## 2021-06-30 NOTE — Chronic Care Management (AMB) (Signed)
Chronic Care Management Pharmacy Assistant   Name: [name]  MRN: [id number] DOB: 03-07-1935  Reason for Encounter: Disease State Hypertension Assessment Call   Conditions to be addressed/monitored: HTN  Recent office visits:  None  Recent consult visits:  None  Hospital visits:  None in previous 6 months  Medications: Outpatient Encounter Medications as of 06/30/2021  Medication Sig   albuterol (VENTOLIN HFA) 108 (90 Base) MCG/ACT inhaler Inhale 2 puffs into the lungs every 6 (six) hours as needed for wheezing or shortness of breath.   aspirin 81 MG EC tablet Take 81 mg by mouth every morning.   calcium carbonate (TUMS - DOSED IN MG ELEMENTAL CALCIUM) 500 MG chewable tablet Chew 1 tablet by mouth as needed for indigestion or heartburn. Reported on 11/19/2015   FLOVENT HFA 44 MCG/ACT inhaler INHALE 1 PUFF BY MOUTH INTO THE LUNGS TWICE DAILY   losartan (COZAAR) 50 MG tablet Take 2 tablets (100 mg total) by mouth daily.   montelukast (SINGULAIR) 10 MG tablet TAKE 1 TABLET BY MOUTH EVERY NIGHT AT BEDTIME GENERIC EQUIVALENT FOR SINGULAIR   Multiple Vitamin (MULTIVITAMIN) tablet Take 1 tablet by mouth every morning. Reported on 11/19/2015   Omega-3 Fatty Acids (FISH OIL) 1200 MG CAPS Take 1,200 mg by mouth every morning.   pantoprazole (PROTONIX) 20 MG tablet TAKE 1 TABLET BY MOUTH EVERY DAY   polyvinyl alcohol (LIQUIFILM TEARS) 1.4 % ophthalmic solution Place 1 drop into both eyes daily as needed for dry eyes. Reported on 11/19/2015   rosuvastatin (CRESTOR) 10 MG tablet Take 1 tablet (10 mg total) by mouth daily. (Patient not taking: No sig reported)   No facility-administered encounter medications on file as of 06/30/2021.  Reviewed chart prior to disease state call. Spoke with patient regarding BP  Recent Office Vitals: BP Readings from Last 3 Encounters:  06/11/21 130/86  04/29/21 (!) 152/72  04/03/21 136/80   Pulse Readings from Last 3 Encounters:  04/29/21 74   04/03/21 65  03/06/21 83    Wt Readings from Last 3 Encounters:  04/29/21 148 lb 6.4 oz (67.3 kg)  04/03/21 145 lb 3.2 oz (65.9 kg)  03/06/21 146 lb 6.4 oz (66.4 kg)     Kidney Function Lab Results  Component Value Date/Time   CREATININE 1.07 12/30/2020 12:20 PM   CREATININE 1.09 08/01/2020 08:58 AM   CREATININE 1.09 01/28/2020 08:46 AM   GFR 64.28 12/30/2020 12:20 PM   GFRNONAA 63 08/01/2020 08:58 AM   GFRAA 73 08/01/2020 08:58 AM    BMP Latest Ref Rng & Units 12/30/2020 08/01/2020 01/28/2020  Glucose 70 - 99 mg/dL 82 222(L) 97  BUN 6 - 23 mg/dL 13 14 15   Creatinine 0.40 - 1.50 mg/dL 7.98 9.21  BUN/Creat Ratio 6 - 22 (calc) - NOT APPLICABLE -  Sodium 135 - 145 mEq/L 141 139 142  Potassium 3.5 - 5.1 mEq/L 4.4 4.0 3.9  Chloride 96 - 112 mEq/L 104 107 104  CO2 19 - 32 mEq/L 31 26 30   Calcium 8.4 - 10.5 mg/dL 10.8(H) 10.5(H) 11.0(H)    Current antihypertensive regimen:  Losartan 50 mg 2 tablets daily - taking 1 tablet Patient noted his new medication has not arrived via mail order yet How often are you checking your Blood Pressure? Patient reports daily. Current home BP readings: Patient reports these readings are prior to taking his medication 06-29-2021 (137/77) 06-28-2021 (140/70) What recent interventions/DTPs have been made by any provider to improve Blood Pressure control  since last CPP Visit: Patient notes he had new medications recommended for his blood pressures that he hasn't started as he is awaiting mail order delivery. Any recent hospitalizations or ED visits since last visit with CPP? None What diet changes have been made to improve Blood Pressure Control?  Patient reports he is watching the addition of salt to his meals he has cut back on doing so. He reports he rarely eats breakfast for lunches and dinners he will have meats including a small steak, hamburger or sandwich, he will also have a baked potato or salad. He reports he is drinking 3 17 oz bottles of  water daily also. What exercise is being done to improve your Blood Pressure Control?  Patient reports there are stairs in his home that he is up and down several times a day. He reports he gets around well and does all of the yard work at the house also.  Adherence Review: Is the patient currently on ACE/ARB medication? Yes Does the patient have >5 day gap between last estimated fill dates? No Notes: Offered follow up appointment call with Clinical Pharmacist on 08-26-2021 patient in agreement  Care Gaps: Zoster Vaccines - Overdue Flu Vaccines - Overdue CCM F/U Call - 08-26-2021 10 am AWV - MSG sent to Carrie Mew CMA to schedule.  Star Rating Drugs: Losartan (Cozaar) 50 mg - Last filled 05-20-2021 90 DS at [facility] Rosuvastatin (Crestor) 10 mg - Last filled 06-23-2021 90 DS at [facility]  Pamala Duffel CMA Clinical Pharmacist Assistant [phone]

## 2021-07-07 DIAGNOSIS — H20012 Primary iridocyclitis, left eye: Secondary | ICD-10-CM | POA: Diagnosis not present

## 2021-07-08 DIAGNOSIS — H5212 Myopia, left eye: Secondary | ICD-10-CM | POA: Diagnosis not present

## 2021-07-08 DIAGNOSIS — H5201 Hypermetropia, right eye: Secondary | ICD-10-CM | POA: Diagnosis not present

## 2021-07-08 DIAGNOSIS — H5712 Ocular pain, left eye: Secondary | ICD-10-CM | POA: Diagnosis not present

## 2021-07-08 DIAGNOSIS — H20012 Primary iridocyclitis, left eye: Secondary | ICD-10-CM | POA: Diagnosis not present

## 2021-07-27 DIAGNOSIS — H5212 Myopia, left eye: Secondary | ICD-10-CM | POA: Diagnosis not present

## 2021-07-27 DIAGNOSIS — H52223 Regular astigmatism, bilateral: Secondary | ICD-10-CM | POA: Diagnosis not present

## 2021-07-27 DIAGNOSIS — H401111 Primary open-angle glaucoma, right eye, mild stage: Secondary | ICD-10-CM | POA: Diagnosis not present

## 2021-07-27 DIAGNOSIS — H5201 Hypermetropia, right eye: Secondary | ICD-10-CM | POA: Diagnosis not present

## 2021-08-03 ENCOUNTER — Other Ambulatory Visit: Payer: Self-pay

## 2021-08-04 ENCOUNTER — Encounter: Payer: Self-pay | Admitting: Family Medicine

## 2021-08-04 ENCOUNTER — Ambulatory Visit (INDEPENDENT_AMBULATORY_CARE_PROVIDER_SITE_OTHER): Payer: Medicare Other

## 2021-08-04 ENCOUNTER — Ambulatory Visit (INDEPENDENT_AMBULATORY_CARE_PROVIDER_SITE_OTHER): Payer: Medicare Other | Admitting: Family Medicine

## 2021-08-04 VITALS — BP 128/80 | HR 70 | Temp 98.4°F | Resp 16 | Ht 64.0 in | Wt 149.1 lb

## 2021-08-04 VITALS — BP 128/80 | HR 53

## 2021-08-04 DIAGNOSIS — Z23 Encounter for immunization: Secondary | ICD-10-CM

## 2021-08-04 DIAGNOSIS — I499 Cardiac arrhythmia, unspecified: Secondary | ICD-10-CM | POA: Diagnosis not present

## 2021-08-04 DIAGNOSIS — Z0000 Encounter for general adult medical examination without abnormal findings: Secondary | ICD-10-CM

## 2021-08-04 DIAGNOSIS — I10 Essential (primary) hypertension: Secondary | ICD-10-CM

## 2021-08-04 LAB — COMPREHENSIVE METABOLIC PANEL
ALT: 23 U/L (ref 0–53)
AST: 34 U/L (ref 0–37)
Albumin: 4.2 g/dL (ref 3.5–5.2)
Alkaline Phosphatase: 74 U/L (ref 39–117)
BUN: 14 mg/dL (ref 6–23)
CO2: 31 mEq/L (ref 19–32)
Calcium: 10.8 mg/dL — ABNORMAL HIGH (ref 8.4–10.5)
Chloride: 105 mEq/L (ref 96–112)
Creatinine, Ser: 1.12 mg/dL (ref 0.40–1.50)
GFR: 60.6 mL/min (ref >60.00)
Glucose, Bld: 94 mg/dL (ref 70–99)
Potassium: 4.1 mEq/L (ref 3.5–5.1)
Sodium: 142 mEq/L (ref 135–145)
Total Bilirubin: 0.6 mg/dL (ref 0.2–1.2)
Total Protein: 7.4 g/dL (ref 6.0–8.3)

## 2021-08-04 LAB — TSH: TSH: 1.22 u[IU]/mL (ref 0.35–5.50)

## 2021-08-04 LAB — CBC
HCT: 41 % (ref 39.0–52.0)
Hemoglobin: 13.5 g/dL (ref 13.0–17.0)
MCHC: 33 g/dL (ref 30.0–36.0)
MCV: 91.8 fl (ref 78.0–100.0)
Platelets: 208 10*3/uL (ref 150.0–400.0)
RBC: 4.46 Mil/uL (ref 4.22–5.81)
RDW: 14 % (ref 11.5–15.5)
WBC: 6.3 10*3/uL (ref 4.0–10.5)

## 2021-08-04 MED ORDER — LOSARTAN POTASSIUM 50 MG PO TABS: 50.0000 mg | ORAL_TABLET | Freq: Every day | ORAL | 2 refills | Status: DC

## 2021-08-04 MED ORDER — AMLODIPINE BESYLATE 5 MG PO TABS: 5.0000 mg | ORAL_TABLET | Freq: Every day | ORAL | 1 refills | Status: DC

## 2021-08-04 MED ORDER — LOSARTAN POTASSIUM 50 MG PO TABS: 50.0000 mg | ORAL_TABLET | Freq: Every day | ORAL | 1 refills | Status: DC

## 2021-08-04 NOTE — Progress Notes (Addendum)
Subjective:   [name] is a 86 y.o. male who presents for Medicare Annual/Subsequent preventive examination.  Review of Systems     Cardiac Risk Factors include: advanced age (>58men, >76 women);hypertension;dyslipidemia;male gender     Objective:    Today's Vitals   08/04/21 1135  BP: 128/80  Pulse: (!) 53  SpO2: 99%   There is no height or weight on file to calculate BMI.  Advanced Directives 08/04/2021 11/15/2019 12/13/2017 04/06/2017 05/30/2015 05/30/2015 05/27/2015  Does Patient Have a Medical Advance Directive? No No No No Yes Yes;No No  Type of Advance Directive - - - - Midwife;Living will - -  Does patient want to make changes to medical advance directive? - - - - No - Patient declined - -  Copy of Healthcare Power of Attorney in Chart? - - - - No - copy requested - -  Would patient like information on creating a medical advance directive? Yes (MAU/Ambulatory/Procedural Areas - Information given) - - - Yes - Educational materials given Yes - Transport planner given Yes - Transport planner given    Current Medications (verified) Outpatient Encounter Medications as of 08/04/2021  Medication Sig   albuterol (VENTOLIN HFA) 108 (90 Base) MCG/ACT inhaler Inhale 2 puffs into the lungs every 6 (six) hours as needed for wheezing or shortness of breath.   amLODipine (NORVASC) 5 MG tablet Take 5 mg by mouth at bedtime.   aspirin 81 MG EC tablet Take 81 mg by mouth every morning.   calcium carbonate (TUMS - DOSED IN MG ELEMENTAL CALCIUM) 500 MG chewable tablet Chew 1 tablet by mouth as needed for indigestion or heartburn. Reported on 11/19/2015   FLOVENT HFA 44 MCG/ACT inhaler INHALE 1 PUFF BY MOUTH INTO THE LUNGS TWICE DAILY   latanoprost (XALATAN) 0.005 % ophthalmic solution 1 drop at bedtime.   losartan (COZAAR) 50 MG tablet Take 1 tablet (50 mg total) by mouth daily.   montelukast (SINGULAIR) 10 MG tablet TAKE 1 TABLET BY MOUTH EVERY NIGHT AT  BEDTIME GENERIC EQUIVALENT FOR SINGULAIR   Multiple Vitamin (MULTIVITAMIN) tablet Take 1 tablet by mouth every morning. Reported on 11/19/2015   Omega-3 Fatty Acids (FISH OIL) 1200 MG CAPS Take 1,200 mg by mouth every morning.   pantoprazole (PROTONIX) 20 MG tablet TAKE 1 TABLET BY MOUTH EVERY DAY   polyvinyl alcohol (LIQUIFILM TEARS) 1.4 % ophthalmic solution Place 1 drop into both eyes daily as needed for dry eyes. Reported on 11/19/2015   rosuvastatin (CRESTOR) 10 MG tablet Take 1 tablet (10 mg total) by mouth daily.   [DISCONTINUED] losartan (COZAAR) 50 MG tablet Take 2 tablets (100 mg total) by mouth daily.   No facility-administered encounter medications on file as of 08/04/2021.    Allergies (verified) Lisinopril   History: Past Medical History:  Diagnosis Date   Allergy    SEASONAL   Arthritis    Asthma    BPH (benign prostatic hypertrophy)    GERD (gastroesophageal reflux disease)    History of kidney stones    History of myasthenia gravis    15 yrs ago - no current problem or treatment   Hypertension    Mild carotid artery disease (HCC)    bilateral ICA 0-39%  per duplex 02-16-2013   Multiple pulmonary nodules    per last CT stable   Nephrolithiasis    BILATERAL   Past Surgical History:  Procedure Laterality Date   CATARACT EXTRACTION W/ INTRAOCULAR LENS  IMPLANT, BILATERAL  2007   COLONOSCOPY     COLONOSCOPY W/ POLYPECTOMY  05-14-2010   CYSTO/  LEFT RETROGRADE PYELOGRAM/  LEFT URETEROSCOPY/  LEFT STENT PLACEMENT  06-17-2008   CYSTOSCOPY W/ RETROGRADES Bilateral 03/24/2015   Procedure: CYSTOSCOPY WITH RETROGRADE PYELOGRAM;  Surgeon: Ihor Gully, MD;  Location: [facility];  Service: Urology;  Laterality: Bilateral;   CYSTOSCOPY WITH STENT PLACEMENT Right 03/24/2015   Procedure: CYSTOSCOPY WITH STENT PLACEMENT;  Surgeon: Ihor Gully, MD;  Location: [facility];  Service: Urology;  Laterality: Right;   CYSTOSCOPY WITH URETEROSCOPY Right  03/24/2015   Procedure: CYSTOSCOPY WITH URETEROSCOPY;  Surgeon: Ihor Gully, MD;  Location: [facility];  Service: Urology;  Laterality: Right;   EXTRACORPOREAL SHOCK WAVE LITHOTRIPSY Bilateral left 02-23-2010 //   right 09-28-2010   HOLMIUM LASER APPLICATION Right 03/24/2015   Procedure: HOLMIUM LASER APPLICATION;  Surgeon: Ihor Gully, MD;  Location: [facility];  Service: Urology;  Laterality: Right;   INGUINAL HERNIA REPAIR Bilateral 2002 approx.   NEPHROLITHOTOMY Left 05/30/2015   Procedure: LEFT PERCUTANEOUS NEPHROLITHOTOMY;  Surgeon: Ihor Gully, MD;  Location: [facility];  Service: Urology;  Laterality: Left;   POLYPECTOMY     STONE EXTRACTION WITH BASKET Right 03/24/2015   Procedure: STONE EXTRACTION WITH BASKET;  Surgeon: Ihor Gully, MD;  Location: [facility];  Service: Urology;  Laterality: Right;   Family History  Problem Relation Age of Onset   Heart disease Mother    Hypertension Mother    Kidney disease Mother    Heart disease Father    Hypertension Father    Diabetes Sister    Diabetes Sister    Hypercalcemia Neg Hx    Social History   Socioeconomic History   Marital status: Married    Spouse name: Not on file   Number of children: Not on file   Years of education: Not on file   Highest education level: Not on file  Occupational History   Not on file  Tobacco Use   Smoking status: Never   Smokeless tobacco: Never  Vaping Use   Vaping Use: Never used  Substance and Sexual Activity   Alcohol use: No    Alcohol/week: 0.0 standard drinks   Drug use: No   Sexual activity: Not on file  Other Topics Concern   Not on file  Social History Narrative   Not on file   Social Determinants of Health   Financial Resource Strain: Low Risk    Difficulty of Paying Living Expenses: Not hard at all  Food Insecurity: No Food Insecurity   Worried About Programme researcher, broadcasting/film/video in the Last Year: Never true   Ran Out of Food in the Last  Year: Never true  Transportation Needs: No Transportation Needs   Lack of Transportation (Medical): No   Lack of Transportation (Non-Medical): No  Physical Activity: Inactive   Days of Exercise per Week: 0 days   Minutes of Exercise per Session: 0 min  Stress: No Stress Concern Present   Feeling of Stress : Not at all  Social Connections: Moderately Integrated   Frequency of Communication with Friends and Family: More than three times a week   Frequency of Social Gatherings with Friends and Family: More than three times a week   Attends Religious Services: 1 to 4 times per year   Active Member of [employer] or Organizations: No   Attends Banker Meetings: Never   Marital Status: Married  Tobacco Counseling Counseling given: Not Answered   Clinical Intake:  Pre-visit preparation completed: Yes  Pain : No/denies pain     BMI - recorded: 25.6 Nutritional Status: BMI 25 -29 Overweight Nutritional Risks: None Diabetes: No  How often do you need to have someone help you when you read instructions, pamphlets, or other written materials from your doctor or pharmacy?: 1 - Never  Diabetic?no  Interpreter Needed?: No  Information entered by :: Lanier Ensign, LPN   Activities of Daily Living In your present state of health, do you have any difficulty performing the following activities: 08/04/2021 08/04/2021  Hearing? N N  Vision? N N  Difficulty concentrating or making decisions? N N  Walking or climbing stairs? N N  Dressing or bathing? N N  Doing errands, shopping? N N  Preparing Food and eating ? N -  Using the Toilet? N -  In the past six months, have you accidently leaked urine? N -  Do you have problems with loss of bowel control? N -  Managing your Medications? N -  Managing your Finances? N -  Housekeeping or managing your Housekeeping? N -  Some recent data might be hidden    Patient Care Team: [place], [name], MD as PCP - General (Family  Medicine) Verner Chol, [facility] as Pharmacist (Pharmacist)  Indicate any recent Medical Services you may have received from other than [facility] providers in the past year (date may be approximate).     Assessment:   This is a routine wellness examination for Kadyn.  Hearing/Vision screen Hearing Screening - Comments:: Pt denies any hearing issues  Vision Screening - Comments:: Pt follows up with Dr Hyacinth Meeker for annual eye exams   Dietary issues and exercise activities discussed: Current Exercise Habits: Home exercise routine (working around the house), Type of exercise: Other - see comments, Time (Minutes): > 60, Frequency (Times/Week): 1, Weekly Exercise (Minutes/Week): 0   Goals Addressed             This Visit's Progress    Patient Stated       None at this time       Depression Screen PHQ 2/9 Scores 08/04/2021 08/04/2021 08/01/2020 06/19/2019 04/06/2017 12/06/2016 09/10/2015  PHQ - 2 Score 0 0 0 0 0 0 0    Fall Risk Fall Risk  08/04/2021 08/04/2021 08/01/2020 08/01/2020 06/19/2019  Falls in the past year? 0 0 0 0 0  Number falls in past yr: 0 - 0 - 0  Injury with Fall? 0 - 0 - 0  Risk for fall due to : Impaired vision - - - -  Risk for fall due to: Comment related to pain in eyes and arthritis in the eye - - - -  Follow up - - Education provided - Falls evaluation completed    FALL RISK PREVENTION PERTAINING TO THE HOME:  Any stairs in or around the home? Yes  If so, are there any without handrails? No  Home free of loose throw rugs in walkways, pet beds, electrical cords, etc? Yes  Adequate lighting in your home to reduce risk of falls? Yes   ASSISTIVE DEVICES UTILIZED TO PREVENT FALLS:  Life alert? No  Use of a cane, walker or w/c? No  Grab bars in the bathroom? No  Shower chair or bench in shower? No  Elevated toilet seat or a handicapped toilet? No   TIMED UP AND GO:  Was the test performed? Yes .  Length of time  to ambulate 10 feet: 10 sec.   Gait steady and  fast with assistive device  Cognitive Function: MMSE - Mini Mental State Exam 04/06/2017  Not completed: (No Data)     6CIT Screen 08/04/2021  What Year? 0 points  What month? 0 points  What time? 0 points  Count back from 20 0 points  Months in reverse 0 points  Repeat phrase 0 points  Total Score 0    Immunizations Immunization History  Administered Date(s) Administered   Fluad Quad(high Dose 65+) 10/02/2019, 08/01/2020, 08/04/2021   Influenza Whole 10/14/2004, 08/18/2007, 08/28/2008, 09/08/2009, 08/20/2010   Influenza, High Dose Seasonal PF 08/27/2013, 09/10/2015, 12/06/2016, 08/25/2017, 09/18/2018   Influenza,inj,Quad PF,6+ Mos 08/29/2014   PFIZER(Purple Top)SARS-COV-2 Vaccination 01/19/2020, 02/19/2020, 08/21/2020, 04/19/2021   Pneumococcal Conjugate-13 08/29/2014   Pneumococcal Polysaccharide-23 02/18/2005   Td 11/15/1998, 05/13/2008   Zoster, Live 06/03/2009      Flu Vaccine status: Completed at today's visit  Pneumococcal vaccine status: Up to date  Covid-19 vaccine status: Completed vaccines  Qualifies for Shingles Vaccine? Yes   Zostavax completed Yes   Shingrix Completed?: No.    Education has been provided regarding the importance of this vaccine. Patient has been advised to call insurance company to determine out of pocket expense if they have not yet received this vaccine. Advised may also receive vaccine at local pharmacy or Health Dept. Verbalized acceptance and understanding.  Screening Tests Health Maintenance  Topic Date Due   Zoster Vaccines- Shingrix (1 of 2) Never done   INFLUENZA VACCINE  Completed   COVID-19 Vaccine  Completed   HPV VACCINES  Aged Out   TETANUS/TDAP  Discontinued    Health Maintenance  Health Maintenance Due  Topic Date Due   Zoster Vaccines- Shingrix (1 of 2) Never done    Colorectal cancer screening: No longer required.   Additional Screening:   Vision Screening: Recommended annual ophthalmology exams for early  detection of glaucoma and other disorders of the eye. Is the patient up to date with their annual eye exam?  Yes  Who is the provider or what is the name of the office in which the patient attends annual eye exams? [facility] If pt is not established with a provider, would they like to be referred to a provider to establish care? No .   Dental Screening: Recommended annual dental exams for proper oral hygiene  Community Resource Referral / Chronic Care Management: CRR required this visit?  No   CCM required this visit?  No      Plan:     I have personally reviewed and noted the following in the patient's chart:   Medical and social history Use of alcohol, tobacco or illicit drugs  Current medications and supplements including opioid prescriptions. Patient is not currently taking opioid prescriptions. Functional ability and status Nutritional status Physical activity Advanced directives List of other physicians Hospitalizations, surgeries, and ER visits in previous 12 months Vitals Screenings to include cognitive, depression, and falls Referrals and appointments  In addition, I have reviewed and discussed with patient certain preventive protocols, quality metrics, and best practice recommendations. A written personalized care plan for preventive services as well as general preventive health recommendations were provided to patient.     Marzella Schlein, LPN   [id number]   Nurse Notes: None

## 2021-08-04 NOTE — Patient Instructions (Signed)
A few things to remember from today's visit:  Irregular heart rate - Plan: EKG 12-Lead, TSH, CBC, Ambulatory referral to Cardiology  Essential hypertension - Plan: losartan (COZAAR) 50 MG tablet, EKG 12-Lead, Comprehensive metabolic panel  If you need refills please call your pharmacy. Do not use My Chart to request refills or for acute issues that need immediate attention.   Continue monitoring blood pressure. Appointment with cardiologist will be arranged because new irregular heart rate.  Please be sure medication list is accurate. If a new problem present, please set up appointment sooner than planned today.

## 2021-08-04 NOTE — Progress Notes (Signed)
HPI: [name] is a 86 y.o. male, who is here today for 3 months follow up.  He was last seen on 04/29/21.  Hypertension:  Medications: Losartan 50 mg daily and amlodipine 5 mg daily. BP readings at home: 131/70, 132/66 136/76, 134/67, 118/71, 119/71, 121/73, 125/70, 130/76, 136/76, and added 14/27. Side effects: None. Since his last visit she has followed with his eye care provider, he has been treated for eye condition thought to be causing his blurry vision.  Negative for unusual or severe headache, exertional chest pain, dyspnea,  focal weakness, or edema.  Lab Results  Component Value Date   CREATININE 1.07 12/30/2020   BUN 13 12/30/2020   NA 141 12/30/2020   K 4.4 12/30/2020   CL 104 12/30/2020   CO2 31 12/30/2020   Today noted mildly irregular heart rate, no prior history. He has had abnormal EKG, first-degree AV block and LAFB. Echo in 04/2020 with LVEF 65 to 70% and grade 1 diastolic dysfunction.  When asking about syncopal episodes he states that ones in a while he feels "a little fainting", hard to described and some chest discomfort but no pain. Lasts a few seconds, he has not identified exacerbating or alleviating factors. He is very active and denies any symptom with exertion.  Lab Results  Component Value Date   TSH 2.08 12/05/2017   Lab Results  Component Value Date   WBC 6.7 06/19/2019   HGB 14.0 06/19/2019   HCT 41.3 06/19/2019   MCV 91.3 06/19/2019   PLT 204.0 06/19/2019   Review of Systems  Constitutional:  Negative for activity change, appetite change, fatigue and fever.  HENT:  Negative for nosebleeds and sore throat.   Respiratory:  Negative for cough and wheezing.   Gastrointestinal:  Negative for abdominal pain, nausea and vomiting.  Endocrine: Negative for cold intolerance and heat intolerance.  Genitourinary:  Negative for decreased urine volume and hematuria.  Neurological:  Negative for dizziness, syncope, facial asymmetry  and weakness.  Rest of ROS, see pertinent positives sand negatives in HPI  Current Outpatient Medications on File Prior to Visit  Medication Sig Dispense Refill   albuterol (VENTOLIN HFA) 108 (90 Base) MCG/ACT inhaler Inhale 2 puffs into the lungs every 6 (six) hours as needed for wheezing or shortness of breath. 1 each 2   amLODipine (NORVASC) 5 MG tablet Take 5 mg by mouth at bedtime.     aspirin 81 MG EC tablet Take 81 mg by mouth every morning.     calcium carbonate (TUMS - DOSED IN MG ELEMENTAL CALCIUM) 500 MG chewable tablet Chew 1 tablet by mouth as needed for indigestion or heartburn. Reported on 11/19/2015     FLOVENT HFA 44 MCG/ACT inhaler INHALE 1 PUFF BY MOUTH INTO THE LUNGS TWICE DAILY 21.2 g 2   latanoprost (XALATAN) 0.005 % ophthalmic solution 1 drop at bedtime.     montelukast (SINGULAIR) 10 MG tablet TAKE 1 TABLET BY MOUTH EVERY NIGHT AT BEDTIME GENERIC EQUIVALENT FOR SINGULAIR 90 tablet 1   Multiple Vitamin (MULTIVITAMIN) tablet Take 1 tablet by mouth every morning. Reported on 11/19/2015     Omega-3 Fatty Acids (FISH OIL) 1200 MG CAPS Take 1,200 mg by mouth every morning.     pantoprazole (PROTONIX) 20 MG tablet TAKE 1 TABLET BY MOUTH EVERY DAY 30 tablet 1   polyvinyl alcohol (LIQUIFILM TEARS) 1.4 % ophthalmic solution Place 1 drop into both eyes daily as needed for dry eyes. Reported on 11/19/2015  rosuvastatin (CRESTOR) 10 MG tablet Take 1 tablet (10 mg total) by mouth daily. 90 tablet 3   No current facility-administered medications on file prior to visit.   Past Medical History:  Diagnosis Date   Allergy    SEASONAL   Arthritis    Asthma    BPH (benign prostatic hypertrophy)    GERD (gastroesophageal reflux disease)    History of kidney stones    History of myasthenia gravis    15 yrs ago - no current problem or treatment   Hypertension    Mild carotid artery disease (HCC)    bilateral ICA 0-39%  per duplex 02-16-2013   Multiple pulmonary nodules    per last CT  stable   Nephrolithiasis    BILATERAL   Allergies  Allergen Reactions   Lisinopril Swelling    Social History   Socioeconomic History   Marital status: Married    Spouse name: Not on file   Number of children: Not on file   Years of education: Not on file   Highest education level: Not on file  Occupational History   Not on file  Tobacco Use   Smoking status: Never   Smokeless tobacco: Never  Vaping Use   Vaping Use: Never used  Substance and Sexual Activity   Alcohol use: No    Alcohol/week: 0.0 standard drinks   Drug use: No   Sexual activity: Not on file  Other Topics Concern   Not on file  Social History Narrative   Not on file   Social Determinants of Health   Financial Resource Strain: Low Risk    Difficulty of Paying Living Expenses: Not hard at all  Food Insecurity: No Food Insecurity   Worried About Programme researcher, broadcasting/film/video in the Last Year: Never true   Ran Out of Food in the Last Year: Never true  Transportation Needs: No Transportation Needs   Lack of Transportation (Medical): No   Lack of Transportation (Non-Medical): No  Physical Activity: Inactive   Days of Exercise per Week: 0 days   Minutes of Exercise per Session: 0 min  Stress: No Stress Concern Present   Feeling of Stress : Not at all  Social Connections: Moderately Integrated   Frequency of Communication with Friends and Family: More than three times a week   Frequency of Social Gatherings with Friends and Family: More than three times a week   Attends Religious Services: 1 to 4 times per year   Active Member of Clubs or Organizations: No   Attends Banker Meetings: Never   Marital Status: Married   Vitals:   08/04/21 1054  BP: 128/80  Pulse: 70  Resp: 16  Temp: 98.4 F (36.9 C)  SpO2: 95%   Body mass index is 25.6 kg/m.  Physical Exam Vitals and nursing note reviewed.  Constitutional:      General: He is not in acute distress.    Appearance: He is well-developed.   HENT:     Head: Normocephalic and atraumatic.  Eyes:     Conjunctiva/sclera: Conjunctivae normal.  Cardiovascular:     Rate and Rhythm: Normal rate. Rhythm irregular.     Pulses:          Dorsalis pedis pulses are 2+ on the right side and 2+ on the left side.     Heart sounds: Murmur (SEM I/VI RUSB) heard.     Comments: Skipping beats occasionally. Pulmonary:     Effort: Pulmonary effort is normal.  No respiratory distress.     Breath sounds: Normal breath sounds.  Abdominal:     Palpations: Abdomen is soft. There is no hepatomegaly or mass.     Tenderness: There is no abdominal tenderness.  Lymphadenopathy:     Cervical: No cervical adenopathy.  Skin:    General: Skin is warm.     Findings: No erythema or rash.  Neurological:     Mental Status: He is alert and oriented to person, place, and time.     Cranial Nerves: No cranial nerve deficit.     Gait: Gait normal.  Psychiatric:     Comments: Well groomed, good eye contact.   ASSESSMENT AND PLAN:  [name] was seen today for 3 months follow-up.  Orders Placed This Encounter  Procedures   Flu Vaccine QUAD High Dose(Fluad)   Comprehensive metabolic panel   TSH   CBC   Ambulatory referral to Cardiology   EKG 12-Lead   Lab Results  Component Value Date   CREATININE 1.12 08/04/2021   BUN 14 08/04/2021   NA 142 08/04/2021   K 4.1 08/04/2021   CL 105 08/04/2021   CO2 31 08/04/2021   Lab Results  Component Value Date   ALT 23 08/04/2021   AST 34 08/04/2021   ALKPHOS 74 08/04/2021   BILITOT 0.6 08/04/2021   Lab Results  Component Value Date   TSH 1.22 08/04/2021   Lab Results  Component Value Date   WBC 6.3 08/04/2021   HGB 13.5 08/04/2021   HCT 41.0 08/04/2021   MCV 91.8 08/04/2021   PLT 208.0 08/04/2021   Irregular heart rate No prior hx of arrhythmia. EKG today SR, 1st degree AV block,LAFB, early repolarization, voltage criteria for LVH. No significant changes when compared with EKG done  in 01/2020. He was taught how to check pulse. Cardio referral placed. Instructed about warning signs.  Essential hypertension BP adequately controlled. Continue current management: Losartan 50 mg daily and Amlodipine 5 mg daily. DASH/low salt diet to continue. Continue monitoring BP regularly. Eye exam is current.  -     losartan (COZAAR) 50 MG tablet; Take 1 tablet (50 mg total) by mouth daily. -     EKG 12-Lead -     Comprehensive metabolic panel; Future -     Comprehensive metabolic panel  Need for influenza vaccination -     Flu Vaccine QUAD High Dose(Fluad)  Return in about 6 months (around 02/01/2022).   Itxel Wickard G. [place], MD  [facility]. [place] office.

## 2021-08-04 NOTE — Patient Instructions (Signed)
[name] , Thank you for taking time to come for your Medicare Wellness Visit. I appreciate your ongoing commitment to your health goals. Please review the following plan we discussed and let me know if I can assist you in the future.   Screening recommendations/referrals: Colonoscopy: No longer required  Recommended yearly ophthalmology/optometry visit for glaucoma screening and checkup Recommended yearly dental visit for hygiene and checkup  Vaccinations: Influenza vaccine: Completed 08/04/21 Pneumococcal vaccine: Completed  Tdap vaccine: Discontinued  Shingles vaccine: Shingrix discussed. Please contact your pharmacy for coverage information.    Covid-19: Completed 3/6, 4/6, 08/21/20 & 04/19/21  Advanced directives: Advance directive discussed with you today. I have provided a copy for you to complete at home and have notarized. Once this is complete please bring a copy in to our office so we can scan it into your chart.  Conditions/risks identified: None at this time  Next appointment: Follow up in one year for your annual wellness visit.   Preventive Care 84 Years and Older, Male Preventive care refers to lifestyle choices and visits with your health care provider that can promote health and wellness. What does preventive care include? A yearly physical exam. This is also called an annual well check. Dental exams once or twice a year. Routine eye exams. Ask your health care provider how often you should have your eyes checked. Personal lifestyle choices, including: Daily care of your teeth and gums. Regular physical activity. Eating a healthy diet. Avoiding tobacco and drug use. Limiting alcohol use. Practicing safe sex. Taking low doses of aspirin every day. Taking vitamin and mineral supplements as recommended by your health care provider. What happens during an annual well check? The services and screenings done by your health care provider during your annual well check  will depend on your age, overall health, lifestyle risk factors, and family history of disease. Counseling  Your health care provider may ask you questions about your: Alcohol use. Tobacco use. Drug use. Emotional well-being. Home and relationship well-being. Sexual activity. Eating habits. History of falls. Memory and ability to understand (cognition). Work and work Astronomer. Screening  You may have the following tests or measurements: Height, weight, and BMI. Blood pressure. Lipid and cholesterol levels. These may be checked every 5 years, or more frequently if you are over 29 years old. Skin check. Lung cancer screening. You may have this screening every year starting at age 45 if you have a 30-pack-year history of smoking and currently smoke or have quit within the past 15 years. Fecal occult blood test (FOBT) of the stool. You may have this test every year starting at age 52. Flexible sigmoidoscopy or colonoscopy. You may have a sigmoidoscopy every 5 years or a colonoscopy every 10 years starting at age 76. Prostate cancer screening. Recommendations will vary depending on your family history and other risks. Hepatitis C blood test. Hepatitis B blood test. Sexually transmitted disease (STD) testing. Diabetes screening. This is done by checking your blood sugar (glucose) after you have not eaten for a while (fasting). You may have this done every 1-3 years. Abdominal aortic aneurysm (AAA) screening. You may need this if you are a current or former smoker. Osteoporosis. You may be screened starting at age 30 if you are at high risk. Talk with your health care provider about your test results, treatment options, and if necessary, the need for more tests. Vaccines  Your health care provider may recommend certain vaccines, such as: Influenza vaccine. This is recommended every  year. Tetanus, diphtheria, and acellular pertussis (Tdap, Td) vaccine. You may need a Td booster every 10  years. Zoster vaccine. You may need this after age 71. Pneumococcal 13-valent conjugate (PCV13) vaccine. One dose is recommended after age 40. Pneumococcal polysaccharide (PPSV23) vaccine. One dose is recommended after age 3. Talk to your health care provider about which screenings and vaccines you need and how often you need them. This information is not intended to replace advice given to you by your health care provider. Make sure you discuss any questions you have with your health care provider. Document Released: 11/28/2015 Document Revised: 07/21/2016 Document Reviewed: 09/02/2015 Elsevier Interactive Patient Education  2017 [place] Prevention in the Home Falls can cause injuries. They can happen to people of all ages. There are many things you can do to make your home safe and to help prevent falls. What can I do on the outside of my home? Regularly fix the edges of walkways and driveways and fix any cracks. Remove anything that might make you trip as you walk through a door, such as a raised step or threshold. Trim any bushes or trees on the path to your home. Use bright outdoor lighting. Clear any walking paths of anything that might make someone trip, such as rocks or tools. Regularly check to see if handrails are loose or broken. Make sure that both sides of any steps have handrails. Any raised decks and porches should have guardrails on the edges. Have any leaves, snow, or ice cleared regularly. Use sand or salt on walking paths during winter. Clean up any spills in your garage right away. This includes oil or grease spills. What can I do in the bathroom? Use night lights. Install grab bars by the toilet and in the tub and shower. Do not use towel bars as grab bars. Use non-skid mats or decals in the tub or shower. If you need to sit down in the shower, use a plastic, non-slip stool. Keep the floor dry. Clean up any water that spills on the floor as soon as it  happens. Remove soap buildup in the tub or shower regularly. Attach bath mats securely with double-sided non-slip rug tape. Do not have throw rugs and other things on the floor that can make you trip. What can I do in the bedroom? Use night lights. Make sure that you have a light by your bed that is easy to reach. Do not use any sheets or blankets that are too big for your bed. They should not hang down onto the floor. Have a firm chair that has side arms. You can use this for support while you get dressed. Do not have throw rugs and other things on the floor that can make you trip. What can I do in the kitchen? Clean up any spills right away. Avoid walking on wet floors. Keep items that you use a lot in easy-to-reach places. If you need to reach something above you, use a strong step stool that has a grab bar. Keep electrical cords out of the way. Do not use floor polish or wax that makes floors slippery. If you must use wax, use non-skid floor wax. Do not have throw rugs and other things on the floor that can make you trip. What can I do with my stairs? Do not leave any items on the stairs. Make sure that there are handrails on both sides of the stairs and use them. Fix handrails that are broken or  loose. Make sure that handrails are as long as the stairways. Check any carpeting to make sure that it is firmly attached to the stairs. Fix any carpet that is loose or worn. Avoid having throw rugs at the top or bottom of the stairs. If you do have throw rugs, attach them to the floor with carpet tape. Make sure that you have a light switch at the top of the stairs and the bottom of the stairs. If you do not have them, ask someone to add them for you. What else can I do to help prevent falls? Wear shoes that: Do not have high heels. Have rubber bottoms. Are comfortable and fit you well. Are closed at the toe. Do not wear sandals. If you use a stepladder: Make sure that it is fully opened.  Do not climb a closed stepladder. Make sure that both sides of the stepladder are locked into place. Ask someone to hold it for you, if possible. Clearly mark and make sure that you can see: Any grab bars or handrails. First and last steps. Where the edge of each step is. Use tools that help you move around (mobility aids) if they are needed. These include: Canes. Walkers. Scooters. Crutches. Turn on the lights when you go into a dark area. Replace any light bulbs as soon as they burn out. Set up your furniture so you have a clear path. Avoid moving your furniture around. If any of your floors are uneven, fix them. If there are any pets around you, be aware of where they are. Review your medicines with your doctor. Some medicines can make you feel dizzy. This can increase your chance of falling. Ask your doctor what other things that you can do to help prevent falls. This information is not intended to replace advice given to you by your health care provider. Make sure you discuss any questions you have with your health care provider. Document Released: 08/28/2009 Document Revised: 04/08/2016 Document Reviewed: 12/06/2014 Elsevier Interactive Patient Education  2017 [employer].

## 2021-08-06 ENCOUNTER — Telehealth: Payer: Self-pay | Admitting: Family Medicine

## 2021-08-06 NOTE — Telephone Encounter (Signed)
PT called to advise that he would like to find out if Dr.Jordan would be okay with faxing over a medical record of release request to [facility] as they they advise the PT the Dr or him would need to complete one. The fax # is [phone]

## 2021-08-07 NOTE — Telephone Encounter (Signed)
Request has been faxed over to [facility].

## 2021-08-21 ENCOUNTER — Other Ambulatory Visit: Payer: Self-pay

## 2021-08-21 DIAGNOSIS — I10 Essential (primary) hypertension: Secondary | ICD-10-CM

## 2021-08-21 MED ORDER — LOSARTAN POTASSIUM 50 MG PO TABS: 50.0000 mg | ORAL_TABLET | Freq: Every day | ORAL | 1 refills | Status: DC

## 2021-08-23 ENCOUNTER — Other Ambulatory Visit: Payer: Self-pay | Admitting: Family Medicine

## 2021-08-26 ENCOUNTER — Telehealth: Payer: Self-pay

## 2021-08-26 ENCOUNTER — Ambulatory Visit (INDEPENDENT_AMBULATORY_CARE_PROVIDER_SITE_OTHER): Payer: Medicare Other | Admitting: Pharmacist

## 2021-08-26 DIAGNOSIS — E785 Hyperlipidemia, unspecified: Secondary | ICD-10-CM

## 2021-08-26 DIAGNOSIS — I10 Essential (primary) hypertension: Secondary | ICD-10-CM

## 2021-08-26 NOTE — Progress Notes (Signed)
Chronic Care Management Pharmacy Note  08/26/2021 Name:  [name] MRN:  [id number] DOB:  [birth year]  Summary: LDL not at goal < 70 BP at goal < 140/90 per home readings  Recommendations/Changes made from today's visit: -Removed fish oil from medication list as patient is no longer taking -Recommend repeat lipid panel and consider increasing rosuvastatin if LDL is above goal < 70  Plan: Follow up BP assessment in 2-3 months   Subjective: [name] is an 86 y.o. year old male who is a primary patient of [name], [name], MD.  The CCM team was consulted for assistance with disease management and care coordination needs.    Engaged with patient by telephone for follow up visit in response to provider referral for pharmacy case management and/or care coordination services.   Consent to Services:  The patient was given information about Chronic Care Management services, agreed to services, and gave verbal consent prior to initiation of services.  Please see initial visit note for detailed documentation.   Patient Care Team: [name], [name], MD as PCP - General (Family Medicine) [name], [facility] as Pharmacist (Pharmacist)  Recent office visits: 08/04/21 [name], LPN: Patient presented for AWV.  08/04/21 [name], [name], MD: Patient presented for HTN follow up. Placed referral to cardiology. Administered influenza vaccine.  05/19/21 Patient message: Patient decreased his losartan to 50 mg on his own.  04/29/21 [name], [name], MD: Patient presented for HTN follow up. Increased losartan to 100 mg daily and discontinued metoprolol succinate 25 MG.  04/03/21 [name], [name], MD: Patient presented for a 24-monthfollow-up for HTN. Prescribed metoprolol succinate 25 mg and rosuvastatin 10 mg daily. Discontinued amlodipine.  03/06/21 BBillie Ruddy MD acute visit seen for Acid reflux: Prescribed pantoprazole 20 MG tablet as needed.  12/30/20 JMartinique [name],  MD: Patient presented for routine physical. Decreased amlodipine to 5 mg and instructed to take at bedtime.   Recent consult visits: None  Hospital visits: None in previous 6 months   Objective:  Lab Results  Component Value Date   CREATININE 1.12 08/04/2021   BUN 14 08/04/2021   GFR 60.60 08/04/2021   GFRNONAA 63 08/01/2020   GFRAA 73 08/01/2020   NA 142 08/04/2021   K 4.1 08/04/2021   CALCIUM 10.8 (H) 08/04/2021   CO2 31 08/04/2021   GLUCOSE 94 08/04/2021    Lab Results  Component Value Date/Time   GFR 60.60 08/04/2021 11:26 AM   GFR 64.28 12/30/2020 12:20 PM    Last diabetic Eye exam: No results found for: HMDIABEYEEXA  Last diabetic Foot exam: No results found for: HMDIABFOOTEX   Lab Results  Component Value Date   CHOL 175 08/01/2020   HDL 52 08/01/2020   LDLCALC 103 (H) 08/01/2020   LDLDIRECT 140.4 05/10/2007   TRIG 100 08/01/2020   CHOLHDL 3.4 08/01/2020    Hepatic Function Latest Ref Rng & Units 08/04/2021 08/01/2020 06/19/2019  Total Protein 6.0 - 8.3 g/dL 7.4 7.1 7.2  Albumin 3.5 - 5.2 g/dL 4.2 - 4.4  AST 0 - 37 U/L 34 24 25  ALT 0 - 53 U/L _0 Alk Phosphatase 39 - 117 U/L 74 - 78  Total Bilirubin 0.2 - 1.2 mg/dL 0.6 0.3 0.5  Bilirubin, Direct 0.0 - 0.3 mg/dL - - -    Lab Results  Component Value Date/Time   TSH 1.22 08/04/2021 11:26 AM   TSH 2.08 12/05/2017 11:42 AM  CBC Latest Ref Rng & Units 08/04/2021 06/19/2019 12/13/2017  WBC 4.0 - 10.5 K/uL 6.3 6.7 10.3  Hemoglobin 13.0 - 17.0 g/dL 13.5 14.0 15.1  Hematocrit 39.0 - 52.0 % 41.0 41.3 43.1  Platelets 150.0 - 400.0 K/uL 208.0 204.0 144(L)    Lab Results  Component Value Date/Time   VD25OH 37.05 12/30/2020 12:20 PM   VD25OH 39.53 01/28/2020 08:46 AM    Clinical ASCVD: Yes  The ASCVD Risk score (Arnett DK, et al., 2019) failed to calculate for the following reasons:   The 2019 ASCVD risk score is only valid for ages 12 to 75    Depression screen PHQ 2/9 08/04/2021 08/04/2021  08/01/2020  Decreased Interest 0 0 0  Down, Depressed, Hopeless 0 0 0  PHQ - 2 Score 0 0 0     Social History   Tobacco Use  Smoking Status Never  Smokeless Tobacco Never   BP Readings from Last 3 Encounters:  08/04/21 128/80  08/04/21 128/80  06/11/21 130/86   Pulse Readings from Last 3 Encounters:  08/04/21 (!) 53  08/04/21 70  04/29/21 74   Wt Readings from Last 3 Encounters:  08/04/21 149 lb 2 oz (67.6 kg)  04/29/21 148 lb 6.4 oz (67.3 kg)  04/03/21 145 lb 3.2 oz (65.9 kg)   BMI Readings from Last 3 Encounters:  08/04/21 25.60 kg/m  04/29/21 25.47 kg/m  04/03/21 24.92 kg/m    Assessment/Interventions: Review of patient past medical history, allergies, medications, health status, including review of consultants reports, laboratory and other test data, was performed as part of comprehensive evaluation and provision of chronic care management services.   SDOH:  (Social Determinants of Health) assessments and interventions performed: No   SDOH Screenings   Alcohol Screen: Not on file  Depression (PHQ2-9): Low Risk    PHQ-2 Score: 0  Financial Resource Strain: Low Risk    Difficulty of Paying Living Expenses: Not hard at all  Food Insecurity: No Food Insecurity   Worried About Charity fundraiser in the Last Year: Never true   Ran Out of Food in the Last Year: Never true  Housing: Low Risk    Last Housing Risk Score: 0  Physical Activity: Inactive   Days of Exercise per Week: 0 days   Minutes of Exercise per Session: 0 min  Social Connections: Moderately Integrated   Frequency of Communication with Friends and Family: More than three times a week   Frequency of Social Gatherings with Friends and Family: More than three times a week   Attends Religious Services: 1 to 4 times per year   Active Member of [employer] or Organizations: No   Attends Music therapist: Never   Marital Status: Married  Stress: No Stress Concern Present   Feeling of Stress :  Not at all  Tobacco Use: Low Risk    Smoking Tobacco Use: Never   Smokeless Tobacco Use: Never  Transportation Needs: No Transportation Needs   Lack of Transportation (Medical): No   Lack of Transportation (Non-Medical): No   CCM Care Plan  Allergies  Allergen Reactions   Lisinopril Swelling    Medications Reviewed Today     Reviewed by [name], [facility] (Pharmacist) on 08/26/21 at [place] List Status: <None>   Medication Order Taking? Sig Documenting Provider Last Dose Status Informant  albuterol (VENTOLIN HFA) 108 (90 Base) MCG/ACT inhaler [id number]  Inhale 2 puffs into the lungs every 6 (six) hours as needed for wheezing  or shortness of breath. [name], [name], MD  Active   amLODipine (NORVASC) 5 MG tablet [id number]  Take 1 tablet (5 mg total) by mouth at bedtime. [name], [name], MD  Active   aspirin 81 MG EC tablet [id number]  Take 81 mg by mouth every morning. [provider]  Active Self  calcium carbonate (TUMS - DOSED IN MG ELEMENTAL CALCIUM) 500 MG chewable tablet [id number]  Chew 1 tablet by mouth as needed for indigestion or heartburn. Reported on 11/19/2015 [provider]  Active Self  FLOVENT HFA 44 MCG/ACT inhaler [id number]  INHALE 1 PUFF BY MOUTH INTO THE LUNGS TWICE DAILY [name], [name], MD  Active   latanoprost (XALATAN) 0.005 % ophthalmic solution [id number] Yes Place 1 drop into both eyes at bedtime. [provider] Taking Active   losartan (COZAAR) 50 MG tablet [id number]  Take 1 tablet (50 mg total) by mouth daily. [name], [name], MD  Active   montelukast (SINGULAIR) 10 MG tablet [id number]  TAKE 1 TABLET BY MOUTH EVERY NIGHT AT BEDTIME. GENERIC EQUIVALENT FOR SINGULAIR [name], [name], MD  Active   Multiple Vitamin (MULTIVITAMIN) tablet [id number]  Take 1 tablet by mouth every morning. Reported on 11/19/2015 [provider]  Active Self  Omega-3 Fatty Acids (FISH OIL) 1200 MG CAPS [id number]  Take 1,200 mg by mouth every morning.  [provider]  Active Self  pantoprazole (PROTONIX) 20 MG tablet [id number]  TAKE 1 TABLET BY MOUTH EVERY DAY Billie Ruddy, MD  Active   polyvinyl alcohol (LIQUIFILM TEARS) 1.4 % ophthalmic solution [id number]  Place 1 drop into both eyes daily as needed for dry eyes. Reported on 11/19/2015 [provider]  Active Self  rosuvastatin (CRESTOR) 10 MG tablet [id number]  Take 1 tablet (10 mg total) by mouth daily. [name], [name], MD  Active             Patient Active Problem List   Diagnosis Date Noted   Vitamin D deficiency, unspecified 12/30/2020   Thoracic aortic aneurysm 08/01/2020   (HFpEF) heart failure with preserved ejection fraction ([place]) 08/01/2020   Heart murmur 01/28/2020   Elevated PSA 01/28/2020   Hx of angioedema 11/26/2019   Aortic atherosclerosis ([place]) 11/26/2019   Dyslipidemia (high LDL; low HDL) 06/18/2019   Routine general medical examination at a health care facility 12/05/2017   Hypercalcemia 11/19/2015   Family history of prostate cancer 09/10/2015   Recurrent kidney stones 03/18/2015   History of multiple pulmonary nodules 04/10/2014   ERECTILE DYSFUNCTION, ORGANIC 05/16/2009   Osteoarthritis, multiple sites 11/20/2008   MIGRAINE, OPHTHALMIC 03/21/2007   Essential hypertension 03/21/2007   Allergic rhinitis 03/21/2007   Asthma 03/21/2007   GERD 03/21/2007   HERNIA 03/21/2007    Immunization History  Administered Date(s) Administered   Fluad Quad(high Dose 65+) 10/02/2019, 08/01/2020, 08/04/2021   Influenza Whole 10/14/2004, 08/18/2007, 08/28/2008, 09/08/2009, 08/20/2010   Influenza, High Dose Seasonal PF 08/27/2013, 09/10/2015, 12/06/2016, 08/25/2017, 09/18/2018   Influenza,inj,Quad PF,6+ Mos 08/29/2014   PFIZER(Purple Top)SARS-COV-2 Vaccination 01/19/2020, 02/19/2020, 08/21/2020, 04/19/2021   Pneumococcal Conjugate-13 08/29/2014   Pneumococcal Polysaccharide-23 02/18/2005   Td 11/15/1998, 05/13/2008   Zoster, Live 06/03/2009    Patient is not having blurry vision anymore with using the eye drops. His eye doctor told him he has arthritis in his eye.  Conditions to be addressed/monitored:  Hypertension, Hyperlipidemia, GERD, Asthma, and Allergic Rhinitis  Conditions addressed this visit: Hypertension, Hyperlipidemia  Care Plan : [place]  Updates  made by [name], [place] since 08/26/2021 12:00 AM     Problem: Problem: Hypertension, Hyperlipidemia, GERD, Asthma, and Allergic Rhinitis      Long-Range Goal: Patient-Specific Goal   Start Date: 06/11/2021  Expected End Date: 06/11/2022  Recent Progress: On track  Priority: High  Note:   Current Barriers:  Unable to achieve control of blood pressure  Suboptimal therapeutic regimen for cholesterol  Pharmacist Clinical Goal(s):  Patient will achieve control of blood pressure as evidenced by home and office blood pressure readings adhere to plan to optimize therapeutic regimen for cholesterol as evidenced by report of adherence to recommended medication management changes through collaboration with PharmD and provider.   Interventions: 1:1 collaboration with [name], [name], MD regarding development and update of comprehensive plan of care as evidenced by provider attestation and co-signature Inter-disciplinary care team collaboration (see longitudinal plan of care) Comprehensive medication review performed; medication list updated in electronic medical record  Hypertension (BP goal <140/90) -Uncontrolled -Current treatment: Losartan 50 mg 1 tablet daily Amlodipine 5 mg 1 tablet daily -Medications previously tried: amlodipine (blurry vision), lisinopril-HCTZ   -Current home readings:  130-135/65-70s  (checking 3 times a week) -Current dietary habits: eating out more often; not limiting salt much; has switched to less salty foods; does not read package labels -Current exercise habits: more active with home repais -Denies  hypotensive/hypertensive symptoms -Educated on BP goals and benefits of medications for prevention of heart attack, stroke and kidney damage; Exercise goal of 150 minutes per week; Importance of home blood pressure monitoring; Proper BP monitoring technique; -Counseled to monitor BP at home daily, document, and provide log at future appointments -Counseled on diet and exercise extensively Recommended to continue current medication  Hyperlipidemia: (LDL goal < 70) -Uncontrolled -Current treatment: Rosuvastatin 10 mg 1 tablet daily  -Medications previously tried: fish oil (not needed) -Current dietary patterns: limiting fried foods -Current exercise habits: active around the house -Educated on Cholesterol goals;  Benefits of statin for ASCVD risk reduction; Importance of limiting foods high in cholesterol; Exercise goal of 150 minutes per week; -Counseled on diet and exercise extensively Recommended repeat lipid panel and consider increasing statin if LDL is above goal of < 70  Aortic atherosclerosis (Goal: prevent heart events) -Not ideally controlled -Current treatment  Rosuvastatin 10 mg 1 tablet daily Aspirin 81 mg 1 tablet daily -Medications previously tried: none  -Recommended to continue current medication   Asthma (Goal: control symptoms) -Controlled -Current treatment  Flovent 44 mcg/act 1 puff twice daily Albuterol HFA as needed Montelukast 10 mg 1 tablet at bedtime -Medications previously tried: none  -Pulmonary function testing: n/a -Patient reports consistent use of maintenance inhaler -Frequency of rescue inhaler use: rare (once in 20 years) -Counseled on Proper inhaler technique; Benefits of consistent maintenance inhaler use -Recommended to continue current medication Counseled on importance of rinsing with water and spitting after each use of Flovent.  GERD (Goal: minimize symptoms) -Controlled -Current treatment  Pantoprazole 20 mg 1 tablet daily as  needed Tums as needed -Medications previously tried: none  -Counseled on non-pharmacologic management of symptoms such as elevating the head of your bed, avoiding eating 2-3 hours before bed, avoiding triggering foods such as acidic, spicy, or fatty foods, eating smaller meals, and wearing clothes that are loose around the waist Recommended use of Tums for quick relief  Health Maintenance -Vaccine gaps: shingrix -Current therapy:  Multivitamin 1 tablet daily Liquifilm tears as needed Latanoprost 1 drop in both eyes at bedtime -Educated  on Cost vs benefit of each product must be carefully weighed by individual consumer -Patient is satisfied with current therapy and denies issues -Recommended to continue current medication  Patient Goals/Self-Care Activities Patient will:  - take medications as prescribed check blood pressure daily, document, and provide at future appointments target a minimum of 150 minutes of moderate intensity exercise weekly  Follow Up Plan: Telephone follow up appointment with care management team member scheduled for: 6 months        Medication Assistance: None required.  Patient affirms current coverage meets needs.  Compliance/Adherence/Medication fill history: Care Gaps: Shingrix  Star-Rating Drugs: Rosuvastatin 10 mg - last filled 04/03/21 for 90 ds at [facility] Losartan 50 mg - last filled 05/20/21 for 90 ds at [facility]  Patient's preferred pharmacy is:  [facility] [place], [place] [address] Phone: [phone] Fax: [phone]  [place], [address] [place] [address] Phone: [phone] Fax: [phone]  Uses pill box? Yes - weekly Pt endorses 100% compliance -Rarely misses medications - maybe once a month  We discussed: Current  pharmacy is preferred with insurance plan and patient is satisfied with pharmacy services Patient decided to: Continue current medication management strategy  Care Plan and Follow Up Patient Decision:  Patient agrees to Care Plan and Follow-up.  Plan: Telephone follow up appointment with care management team member scheduled for:  6 months  Jeni Salles, PharmD, [place] Pharmacist [place] at [place] [phone]

## 2021-08-26 NOTE — Patient Instructions (Signed)
Hi Ed,  It was great to catch up! Keep up the good work with checking your blood pressures regularly at home.  Please let me know if you have any questions or need anything before our follow up.  Best, Maddie  Gaylord Shih, PharmD, [facility] Clinical Pharmacist [facility] [place] [phone]  Visit Information   Goals Addressed   None    Patient Care Plan: CCM Pharmacy Care Plan     Problem Identified: Problem: Hypertension, Hyperlipidemia, GERD, Asthma, and Allergic Rhinitis      Long-Range Goal: Patient-Specific Goal   Start Date: 06/11/2021  Expected End Date: 06/11/2022  Recent Progress: On track  Priority: High  Note:   Current Barriers:  Unable to achieve control of blood pressure  Suboptimal therapeutic regimen for cholesterol  Pharmacist Clinical Goal(s):  Patient will achieve control of blood pressure as evidenced by home and office blood pressure readings adhere to plan to optimize therapeutic regimen for cholesterol as evidenced by report of adherence to recommended medication management changes through collaboration with PharmD and provider.   Interventions: 1:1 collaboration with [place], [name], MD regarding development and update of comprehensive plan of care as evidenced by provider attestation and co-signature Inter-disciplinary care team collaboration (see longitudinal plan of care) Comprehensive medication review performed; medication list updated in electronic medical record  Hypertension (BP goal <140/90) -Uncontrolled -Current treatment: Losartan 50 mg 1 tablet daily Amlodipine 5 mg 1 tablet daily -Medications previously tried: amlodipine (blurry vision), lisinopril-HCTZ   -Current home readings:  130-135/65-70s  (checking 3 times a week) -Current dietary habits: eating out more often; not limiting salt much; has switched to less salty foods; does not read package labels -Current exercise habits: more active with home  repais -Denies hypotensive/hypertensive symptoms -Educated on BP goals and benefits of medications for prevention of heart attack, stroke and kidney damage; Exercise goal of 150 minutes per week; Importance of home blood pressure monitoring; Proper BP monitoring technique; -Counseled to monitor BP at home daily, document, and provide log at future appointments -Counseled on diet and exercise extensively Recommended to continue current medication  Hyperlipidemia: (LDL goal < 70) -Uncontrolled -Current treatment: Rosuvastatin 10 mg 1 tablet daily  -Medications previously tried: fish oil (not needed) -Current dietary patterns: limiting fried foods -Current exercise habits: active around the house -Educated on Cholesterol goals;  Benefits of statin for ASCVD risk reduction; Importance of limiting foods high in cholesterol; Exercise goal of 150 minutes per week; -Counseled on diet and exercise extensively Recommended repeat lipid panel and consider increasing statin if LDL is above goal of < 70  Aortic atherosclerosis (Goal: prevent heart events) -Not ideally controlled -Current treatment  Rosuvastatin 10 mg 1 tablet daily Aspirin 81 mg 1 tablet daily -Medications previously tried: none  -Recommended to continue current medication   Asthma (Goal: control symptoms) -Controlled -Current treatment  Flovent 44 mcg/act 1 puff twice daily Albuterol HFA as needed Montelukast 10 mg 1 tablet at bedtime -Medications previously tried: none  -Pulmonary function testing: n/a -Patient reports consistent use of maintenance inhaler -Frequency of rescue inhaler use: rare (once in 20 years) -Counseled on Proper inhaler technique; Benefits of consistent maintenance inhaler use -Recommended to continue current medication Counseled on importance of rinsing with water and spitting after each use of Flovent.  GERD (Goal: minimize symptoms) -Controlled -Current treatment  Pantoprazole 20 mg 1  tablet daily as needed Tums as needed -Medications previously tried: none  -Counseled on non-pharmacologic management of symptoms such as elevating the  head of your bed, avoiding eating 2-3 hours before bed, avoiding triggering foods such as acidic, spicy, or fatty foods, eating smaller meals, and wearing clothes that are loose around the waist Recommended use of Tums for quick relief  Health Maintenance -Vaccine gaps: shingrix -Current therapy:  Multivitamin 1 tablet daily Liquifilm tears as needed Latanoprost 1 drop in both eyes at bedtime -Educated on Cost vs benefit of each product must be carefully weighed by individual consumer -Patient is satisfied with current therapy and denies issues -Recommended to continue current medication  Patient Goals/Self-Care Activities Patient will:  - take medications as prescribed check blood pressure daily, document, and provide at future appointments target a minimum of 150 minutes of moderate intensity exercise weekly  Follow Up Plan: Telephone follow up appointment with care management team member scheduled for: 6 months       Patient verbalizes understanding of instructions provided today and agrees to view in MyChart.  Telephone follow up appointment with pharmacy team member scheduled for: 6 months  Verner Chol, [facility]

## 2021-08-26 NOTE — Telephone Encounter (Signed)
Pt states he uses a mail service & received refill of medication on yesterday. Confirms that he is very compliant with his medications. Has no ques/concerns at this time.

## 2021-09-01 NOTE — Progress Notes (Signed)
Cardiology Office Note:    Date:  09/01/2021   ID:  [name], DOB [birth year], MRN [id number]  PCP:  [place], [name], MD   [facility] Providers Cardiologist:  None     Referring MD: [place], [name], MD   No chief complaint on file. Palpitations  History of Present Illness:    [name] is a 86 y.o. male with a hx  htn referral for irregular heart rate  He was referred from his PCP secondary to irregular heart rate. He's had no issues. He has not felt anything. He has no history of syncope. He denies angina, dyspnea on exertion, lower extremity edema, PND or orthopnea. He denies LH or dizziness.He denies racing heart rates. He has been doing a lot of remodeling , and hauling lumber and yard word. 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 He has no significant limitations with this work. He had COVID last year and has slowed a little. No smoking cigarettes.   He underwent stress testing and his heart rate was high, 25-30 years ago. He had no heart catheterization. This was done at [facility]  No CHF hospitalizations  Blood pressure well controlled  EKG 07/17/2021: sinus rhythm, 1st degree AV block, LVH Cardiac strip 05/30/2015- sinus rhythm  Echo 04/18/2020: Normal LVEF, moderate LVH Left atrium is normal Ascending aorta 43 mm Grade I DD RV fxn is normal  2014: BL carotid doppler- no significant stenosis  Family History: Father- CAD ;Mother- CABG/bypass. No known siblings. No children. No family hx of SCD  Past Medical History:  Diagnosis Date   Allergy    SEASONAL   Arthritis    Asthma    BPH (benign prostatic hypertrophy)    GERD (gastroesophageal reflux disease)    History of kidney stones    History of myasthenia gravis    15 yrs ago - no current problem or treatment   Hypertension    Mild carotid artery disease (HCC)    bilateral ICA 0-39%  per duplex 02-16-2013   Multiple pulmonary nodules    per last CT stable   Nephrolithiasis     BILATERAL    Past Surgical History:  Procedure Laterality Date   CATARACT EXTRACTION W/ INTRAOCULAR LENS  IMPLANT, BILATERAL  2007   COLONOSCOPY     COLONOSCOPY W/ POLYPECTOMY  05-14-2010   CYSTO/  LEFT RETROGRADE PYELOGRAM/  LEFT URETEROSCOPY/  LEFT STENT PLACEMENT  06-17-2008   CYSTOSCOPY W/ RETROGRADES Bilateral 03/24/2015   Procedure: CYSTOSCOPY WITH RETROGRADE PYELOGRAM;  Surgeon: Ihor Gully, MD;  Location: [facility];  Service: Urology;  Laterality: Bilateral;   CYSTOSCOPY WITH STENT PLACEMENT Right 03/24/2015   Procedure: CYSTOSCOPY WITH STENT PLACEMENT;  Surgeon: Ihor Gully, MD;  Location: [facility];  Service: Urology;  Laterality: Right;   CYSTOSCOPY WITH URETEROSCOPY Right 03/24/2015   Procedure: CYSTOSCOPY WITH URETEROSCOPY;  Surgeon: Ihor Gully, MD;  Location: [facility];  Service: Urology;  Laterality: Right;   EXTRACORPOREAL SHOCK WAVE LITHOTRIPSY Bilateral left 02-23-2010 //   right 09-28-2010   HOLMIUM LASER APPLICATION Right 03/24/2015   Procedure: HOLMIUM LASER APPLICATION;  Surgeon: Ihor Gully, MD;  Location: [facility];  Service: Urology;  Laterality: Right;   INGUINAL HERNIA REPAIR Bilateral 2002 approx.   NEPHROLITHOTOMY Left 05/30/2015   Procedure: LEFT PERCUTANEOUS NEPHROLITHOTOMY;  Surgeon: Ihor Gully, MD;  Location: [facility];  Service: Urology;  Laterality: Left;   POLYPECTOMY     STONE EXTRACTION WITH BASKET  Right 03/24/2015   Procedure: STONE EXTRACTION WITH BASKET;  Surgeon: Ihor Gully, MD;  Location: [facility];  Service: Urology;  Laterality: Right;    Current Medications: No outpatient medications have been marked as taking for the 09/02/21 encounter (Appointment) with [name], MD.     Allergies:   Lisinopril   Social History   Socioeconomic History   Marital status: Married    Spouse name: Not on file   Number of children: Not on file   Years of education: Not  on file   Highest education level: Not on file  Occupational History   Not on file  Tobacco Use   Smoking status: Never   Smokeless tobacco: Never  Vaping Use   Vaping Use: Never used  Substance and Sexual Activity   Alcohol use: No    Alcohol/week: 0.0 standard drinks   Drug use: No   Sexual activity: Not on file  Other Topics Concern   Not on file  Social History Narrative   Not on file   Social Determinants of Health   Financial Resource Strain: Low Risk    Difficulty of Paying Living Expenses: Not hard at all  Food Insecurity: No Food Insecurity   Worried About Programme researcher, broadcasting/film/video in the Last Year: Never true   Ran Out of Food in the Last Year: Never true  Transportation Needs: No Transportation Needs   Lack of Transportation (Medical): No   Lack of Transportation (Non-Medical): No  Physical Activity: Inactive   Days of Exercise per Week: 0 days   Minutes of Exercise per Session: 0 min  Stress: No Stress Concern Present   Feeling of Stress : Not at all  Social Connections: Moderately Integrated   Frequency of Communication with Friends and Family: More than three times a week   Frequency of Social Gatherings with Friends and Family: More than three times a week   Attends Religious Services: 1 to 4 times per year   Active Member of [employer] or Organizations: No   Attends Banker Meetings: Never   Marital Status: Married     Family History: The patient's family history includes Diabetes in his sister and sister; Heart disease in his father and mother; Hypertension in his father and mother; Kidney disease in his mother. There is no history of Hypercalcemia.  ROS:   Please see the history of present illness.     All other systems reviewed and are negative.  EKGs/Labs/Other Studies Reviewed:    The following studies were reviewed today:   EKG:  EKG is  ordered today.  The ekg ordered today demonstrates   NSR,  1st degree AV block, LAD, LVH  Recent  Labs: 08/04/2021: ALT 23; BUN 14; Creatinine, Ser 1.12; Hemoglobin 13.5; Platelets 208.0; Potassium 4.1; Sodium 142; TSH 1.22  Recent Lipid Panel    Component Value Date/Time   CHOL 175 08/01/2020 0858   TRIG 100 08/01/2020 0858   HDL 52 08/01/2020 0858   CHOLHDL 3.4 08/01/2020 0858   VLDL 18.2 06/19/2019 0927   LDLCALC 103 (H) 08/01/2020 0858   LDLDIRECT 140.4 05/10/2007 1116     Risk Assessment/Calculations:           Physical Exam:    VS:  There were no vitals taken for this visit.    Wt Readings from Last 3 Encounters:  08/04/21 149 lb 2 oz (67.6 kg)  04/29/21 148 lb 6.4 oz (67.3 kg)  04/03/21 145 lb 3.2  oz (65.9 kg)     GEN:  Well nourished, well developed in no acute distress HEENT: Normal NECK: No JVD; No carotid bruits CARDIAC: RRR, no murmurs, rubs, gallops Vasc: 2+ BL radial pulses RESPIRATORY:  Clear to auscultation without rales, wheezing or rhonchi  ABDOMEN: Soft, non-tender, non-distended MUSCULOSKELETAL:  No edema; No deformity  SKIN: Warm and dry NEUROLOGIC:  Alert and oriented x 3 PSYCHIATRIC:  Normal affect   ASSESSMENT:    #Palpitations: Mr. Milstein has normal LVH except concentric LVH. His blood pressure is well controlled. His ECG does not show atrial fibrillation/flutter, atrial or ventricular ectopy. He is asymptomatic. He's had no syncopal episodes. I have low suspicion of an arrhythmia. If he were to develop symptoms of increased palpitations, light headedness, dizziness or syncope I would be happy to see him back. Otherwise he can follow up as needed.   PLAN:    In order of problems listed above:  PRN follow-up        Medication Adjustments/Labs and Tests Ordered: Current medicines are reviewed at length with the patient today.  Concerns regarding medicines are outlined above.  No orders of the defined types were placed in this encounter.  No orders of the defined types were placed in this encounter.   There are no Patient  Instructions on file for this visit.   Signed, [name], MD  09/01/2021 2:15 PM    [facility]

## 2021-09-02 ENCOUNTER — Ambulatory Visit: Payer: Medicare Other | Admitting: Internal Medicine

## 2021-09-02 ENCOUNTER — Encounter: Payer: Self-pay | Admitting: Internal Medicine

## 2021-09-02 ENCOUNTER — Other Ambulatory Visit: Payer: Self-pay

## 2021-09-02 VITALS — BP 122/80 | HR 69 | Ht 65.0 in | Wt 153.2 lb

## 2021-09-02 DIAGNOSIS — R002 Palpitations: Secondary | ICD-10-CM | POA: Diagnosis not present

## 2021-09-02 NOTE — Patient Instructions (Signed)
Medication Instructions:  Your Physician recommend you continue on your current medication as directed.    *If you need a refill on your cardiac medications before your next appointment, please call your pharmacy*   Lab Work: None ordered today   Testing/Procedures: None ordered today   Follow-Up: At [facility], you and your health needs are our priority.  As part of our continuing mission to provide you with exceptional heart care, we have created designated Provider Care Teams.  These Care Teams include your primary Cardiologist (physician) and Advanced Practice Providers (APPs -  Physician Assistants and Nurse Practitioners) who all work together to provide you with the care you need, when you need it.  We recommend signing up for the patient portal called "MyChart".  Sign up information is provided on this After Visit Summary.  MyChart is used to connect with patients for Virtual Visits (Telemedicine).  Patients are able to view lab/test results, encounter notes, upcoming appointments, etc.  Non-urgent messages can be sent to your provider as well.   To learn more about what you can do with MyChart, go to [email or web address].    Your next appointment:   As needed   The format for your next appointment:   In Person  Provider:   Mary Branch, MD   

## 2021-09-14 DIAGNOSIS — E785 Hyperlipidemia, unspecified: Secondary | ICD-10-CM

## 2021-09-14 DIAGNOSIS — I10 Essential (primary) hypertension: Secondary | ICD-10-CM | POA: Diagnosis not present

## 2021-10-26 DIAGNOSIS — H5212 Myopia, left eye: Secondary | ICD-10-CM | POA: Diagnosis not present

## 2021-10-26 DIAGNOSIS — H52223 Regular astigmatism, bilateral: Secondary | ICD-10-CM | POA: Diagnosis not present

## 2021-10-26 DIAGNOSIS — H401111 Primary open-angle glaucoma, right eye, mild stage: Secondary | ICD-10-CM | POA: Diagnosis not present

## 2021-10-26 DIAGNOSIS — H5201 Hypermetropia, right eye: Secondary | ICD-10-CM | POA: Diagnosis not present

## 2021-11-13 ENCOUNTER — Telehealth: Payer: Self-pay

## 2021-11-13 NOTE — Telephone Encounter (Signed)
Last OV 08/04/21 recommendation from PCP: Return in about 6 months (around 02/01/2022).  No appt noted at this time. Left message with wife for pt to return call to schedule appt with PCP for time recommended.

## 2021-11-17 ENCOUNTER — Telehealth: Payer: Self-pay | Admitting: Pharmacist

## 2021-11-17 NOTE — Chronic Care Management (AMB) (Signed)
Chronic Care Management Pharmacy Assistant   Name: [name]  MRN: [id number] DOB: August 25, 1935  Reason for Encounter: Disease State / Hypertension Assessment   Conditions to be addressed/monitored: HTN  Recent office visits:  None  Recent consult visits:  09/02/21 [name] (Cardiology) - Patient presented for Palpitations. No medication changes.   Hospital visits:  None in previous 6 months  Medications: Outpatient Encounter Medications as of 11/17/2021  Medication Sig   albuterol (VENTOLIN HFA) 108 (90 Base) MCG/ACT inhaler Inhale 2 puffs into the lungs every 6 (six) hours as needed for wheezing or shortness of breath.   amLODipine (NORVASC) 5 MG tablet Take 1 tablet (5 mg total) by mouth at bedtime.   aspirin 81 MG EC tablet Take 81 mg by mouth every morning.   calcium carbonate (TUMS - DOSED IN MG ELEMENTAL CALCIUM) 500 MG chewable tablet Chew 1 tablet by mouth as needed for indigestion or heartburn. Reported on 11/19/2015   FLOVENT HFA 44 MCG/ACT inhaler INHALE 1 PUFF BY MOUTH INTO THE LUNGS TWICE DAILY   latanoprost (XALATAN) 0.005 % ophthalmic solution Place 1 drop into both eyes at bedtime.   losartan (COZAAR) 50 MG tablet Take 1 tablet (50 mg total) by mouth daily.   montelukast (SINGULAIR) 10 MG tablet TAKE 1 TABLET BY MOUTH EVERY NIGHT AT BEDTIME. GENERIC EQUIVALENT FOR SINGULAIR   Multiple Vitamin (MULTIVITAMIN) tablet Take 1 tablet by mouth every morning. Reported on 11/19/2015   pantoprazole (PROTONIX) 20 MG tablet TAKE 1 TABLET BY MOUTH EVERY DAY   polyvinyl alcohol (LIQUIFILM TEARS) 1.4 % ophthalmic solution Place 1 drop into both eyes daily as needed for dry eyes. Reported on 11/19/2015   rosuvastatin (CRESTOR) 10 MG tablet Take 1 tablet (10 mg total) by mouth daily.   No facility-administered encounter medications on file as of 11/17/2021.  Reviewed chart prior to disease state call. Spoke with patient regarding BP  Recent Office Vitals: BP Readings  from Last 3 Encounters:  09/02/21 122/80  08/04/21 128/80  08/04/21 128/80   Pulse Readings from Last 3 Encounters:  09/02/21 69  08/04/21 (!) 53  08/04/21 70    Wt Readings from Last 3 Encounters:  09/02/21 153 lb 3.2 oz (69.5 kg)  08/04/21 149 lb 2 oz (67.6 kg)  04/29/21 148 lb 6.4 oz (67.3 kg)     Kidney Function Lab Results  Component Value Date/Time   CREATININE 1.12 08/04/2021 11:26 AM   CREATININE 1.07 12/30/2020 12:20 PM   CREATININE 1.09 08/01/2020 08:58 AM   GFR 60.60 08/04/2021 11:26 AM   GFRNONAA 63 08/01/2020 08:58 AM   GFRAA 73 08/01/2020 08:58 AM    BMP Latest Ref Rng & Units 08/04/2021 12/30/2020 08/01/2020  Glucose 70 - 99 mg/dL 94 82 056(P)  BUN 6 - 23 mg/dL 14 13 14   Creatinine 0.40 - 1.50 mg/dL 7.94 8.01  BUN/Creat Ratio 6 - 22 (calc) - - NOT APPLICABLE  Sodium 135 - 145 mEq/L 142 141 139  Potassium 3.5 - 5.1 mEq/L 4.1 4.4 4.0  Chloride 96 - 112 mEq/L 105 104 107  CO2 19 - 32 mEq/L 31 31 26   Calcium 8.4 - 10.5 mg/dL 10.8(H) 10.8(H) 10.5(H)    Current antihypertensive regimen:  Losartan 50 mg 1 tablet daily Amlodipine 5 mg 1 tablet daily How often are you checking your Blood Pressure? weekly Current home BP readings: Patient reports home reading of 138/68. Patient denies any headache, dizziness What recent interventions/DTPs have been made  by any provider to improve Blood Pressure control since last CPP Visit: Patient reports no changes Any recent hospitalizations or ED visits since last visit with CPP? None What diet changes have been made to improve Blood Pressure Control?  Patient reports his meal patterns unchanged. Lunch and dinner is a meat and starch and veggie has water throughout the day. What exercise is being done to improve your Blood Pressure Control?  Patient reports he is walking in the home and has stairs he is up and down and some yard work.  Adherence Review: Is the patient currently on ACE/ARB medication? Yes Does the  patient have >5 day gap between last estimated fill dates? No   Care Gaps: COVID Vaccine - Overdue Zoster Vaccine - Overdue CCM- 4/23 AWV- 9/22  Star Rating Drugs: Losartan (Cozaar) 50 mg - Last filled 11/11/2021 90 DS at [facility] Rosuvastatin (Crestor) 10 mg - Last filled 06/23/2021 90 DS at [facility] Call to [facility] confirmed the above dates as accurate    Pamala Duffel [facility] Clinical Pharmacist Assistant [phone]

## 2021-11-30 DIAGNOSIS — H5212 Myopia, left eye: Secondary | ICD-10-CM | POA: Diagnosis not present

## 2021-11-30 DIAGNOSIS — H52223 Regular astigmatism, bilateral: Secondary | ICD-10-CM | POA: Diagnosis not present

## 2021-11-30 DIAGNOSIS — H401111 Primary open-angle glaucoma, right eye, mild stage: Secondary | ICD-10-CM | POA: Diagnosis not present

## 2021-11-30 DIAGNOSIS — H5201 Hypermetropia, right eye: Secondary | ICD-10-CM | POA: Diagnosis not present

## 2022-01-05 DIAGNOSIS — R310 Gross hematuria: Secondary | ICD-10-CM | POA: Diagnosis not present

## 2022-01-05 DIAGNOSIS — N200 Calculus of kidney: Secondary | ICD-10-CM | POA: Diagnosis not present

## 2022-01-15 ENCOUNTER — Encounter: Payer: Medicare Other | Admitting: Family Medicine

## 2022-01-18 DIAGNOSIS — R310 Gross hematuria: Secondary | ICD-10-CM | POA: Diagnosis not present

## 2022-01-18 DIAGNOSIS — K8020 Calculus of gallbladder without cholecystitis without obstruction: Secondary | ICD-10-CM | POA: Diagnosis not present

## 2022-01-18 DIAGNOSIS — K5900 Constipation, unspecified: Secondary | ICD-10-CM | POA: Diagnosis not present

## 2022-01-18 DIAGNOSIS — N202 Calculus of kidney with calculus of ureter: Secondary | ICD-10-CM | POA: Diagnosis not present

## 2022-01-19 NOTE — Progress Notes (Deleted)
? ?HPI: ? ?Mr. MILEN LENGACHER is a 87 y.o.male here today for his routine physical examination. ? ?Last CPE: unsure, last AWV 08/04/21 ?He lives with *** ? ?Regular exercise 3 or more times per week: *** ?Following a healthy diet: *** ? ?Chronic medical problems: *** ? ?Immunization History  ?Administered Date(s) Administered  ? Fluad Quad(high Dose 65+) 10/02/2019, 08/01/2020, 08/04/2021  ? Influenza Whole 10/14/2004, 08/18/2007, 08/28/2008, 09/08/2009, 08/20/2010  ? Influenza, High Dose Seasonal PF 08/27/2013, 09/10/2015, 12/06/2016, 08/25/2017, 09/18/2018  ? Influenza,inj,Quad PF,6+ Mos 08/29/2014  ? PFIZER(Purple Top)SARS-COV-2 Vaccination 01/19/2020, 02/19/2020, 08/21/2020, 04/19/2021  ? Pneumococcal Conjugate-13 08/29/2014  ? Pneumococcal Polysaccharide-23 02/18/2005  ? Td 11/15/1998, 05/13/2008  ? Zoster, Live 06/03/2009  ? ? ?Health Maintenance  ?Topic Date Due  ? Zoster Vaccines- Shingrix (1 of 2) Never done  ? COVID-19 Vaccine (5 - Booster for Pfizer series) 06/14/2021  ? Pneumonia Vaccine 58+ Years old  Completed  ? INFLUENZA VACCINE  Completed  ? HPV VACCINES  Aged Out  ? TETANUS/TDAP  Discontinued  ? ? ?Last prostate ca screening: *** ? ?-Negative for high alcohol intake or tobacco use. ? ?-Concerns and/or follow up today: *** ? ?Review of Systems ? ? ?Current Outpatient Medications on File Prior to Visit  ?Medication Sig Dispense Refill  ? albuterol (VENTOLIN HFA) 108 (90 Base) MCG/ACT inhaler Inhale 2 puffs into the lungs every 6 (six) hours as needed for wheezing or shortness of breath. 1 each 2  ? amLODipine (NORVASC) 5 MG tablet Take 1 tablet (5 mg total) by mouth at bedtime. 90 tablet 1  ? aspirin 81 MG EC tablet Take 81 mg by mouth every morning.    ? calcium carbonate (TUMS - DOSED IN MG ELEMENTAL CALCIUM) 500 MG chewable tablet Chew 1 tablet by mouth as needed for indigestion or heartburn. Reported on 11/19/2015    ? FLOVENT HFA 44 MCG/ACT inhaler INHALE 1 PUFF BY MOUTH INTO THE LUNGS TWICE  DAILY 21.2 g 2  ? latanoprost (XALATAN) 0.005 % ophthalmic solution Place 1 drop into both eyes at bedtime.    ? losartan (COZAAR) 50 MG tablet Take 1 tablet (50 mg total) by mouth daily. 90 tablet 1  ? montelukast (SINGULAIR) 10 MG tablet TAKE 1 TABLET BY MOUTH EVERY NIGHT AT BEDTIME. GENERIC EQUIVALENT FOR SINGULAIR 90 tablet 1  ? Multiple Vitamin (MULTIVITAMIN) tablet Take 1 tablet by mouth every morning. Reported on 11/19/2015    ? pantoprazole (PROTONIX) 20 MG tablet TAKE 1 TABLET BY MOUTH EVERY DAY 30 tablet 1  ? polyvinyl alcohol (LIQUIFILM TEARS) 1.4 % ophthalmic solution Place 1 drop into both eyes daily as needed for dry eyes. Reported on 11/19/2015    ? rosuvastatin (CRESTOR) 10 MG tablet Take 1 tablet (10 mg total) by mouth daily. 90 tablet 3  ? ?No current facility-administered medications on file prior to visit.  ? ? ? ?Past Medical History:  ?Diagnosis Date  ? Allergy   ? SEASONAL  ? Arthritis   ? Asthma   ? BPH (benign prostatic hypertrophy)   ? GERD (gastroesophageal reflux disease)   ? History of kidney stones   ? History of myasthenia gravis   ? 15 yrs ago - no current problem or treatment  ? Hypertension   ? Mild carotid artery disease (HCC)   ? bilateral ICA 0-39%  per duplex 02-16-2013  ? Multiple pulmonary nodules   ? per last CT stable  ? Nephrolithiasis   ? BILATERAL  ? ? ?Past  Surgical History:  ?Procedure Laterality Date  ? CATARACT EXTRACTION W/ INTRAOCULAR LENS  IMPLANT, BILATERAL  2007  ? COLONOSCOPY    ? COLONOSCOPY W/ POLYPECTOMY  05-14-2010  ? CYSTO/  LEFT RETROGRADE PYELOGRAM/  LEFT URETEROSCOPY/  LEFT STENT PLACEMENT  06-17-2008  ? CYSTOSCOPY W/ RETROGRADES Bilateral 03/24/2015  ? Procedure: CYSTOSCOPY WITH RETROGRADE PYELOGRAM;  Surgeon: Ihor Gully, MD;  Location: [facility];  Service: Urology;  Laterality: Bilateral;  ? CYSTOSCOPY WITH STENT PLACEMENT Right 03/24/2015  ? Procedure: CYSTOSCOPY WITH STENT PLACEMENT;  Surgeon: Ihor Gully, MD;  Location: [facility];  Service: Urology;  Laterality: Right;  ? CYSTOSCOPY WITH URETEROSCOPY Right 03/24/2015  ? Procedure: CYSTOSCOPY WITH URETEROSCOPY;  Surgeon: Ihor Gully, MD;  Location[facility];  Service: Urology;  Laterality: Right;  ? EXTRACORPOREAL SHOCK WAVE LITHOTRIPSY Bilateral left 02-23-2010 //   right 09-28-2010  ? HOLMIUM LASER APPLICATION Right 03/24/2015  ? Procedure: HOLMIUM LASER APPLICATION;  Surgeon: Ihor Gully, MD;  Location: [facility];  Service: Urology;  Laterality: Right;  ? INGUINAL HERNIA REPAIR Bilateral 2002 approx.  ? NEPHROLITHOTOMY Left 05/30/2015  ? Procedure: LEFT PERCUTANEOUS NEPHROLITHOTOMY;  Surgeon: Ihor Gully, MD;  Location: [facility];  Service: Urology;  Laterality: Left;  ? POLYPECTOMY    ? STONE EXTRACTION WITH BASKET Right 03/24/2015  ? Procedure: STONE EXTRACTION WITH BASKET;  Surgeon: Ihor Gully, MD;  Location: [facility];  Service: Urology;  Laterality: Right;  ? ? ?Allergies  ?Allergen Reactions  ? Lisinopril Swelling  ? ? ?Family History  ?Problem Relation Age of Onset  ? Heart disease Mother   ? Hypertension Mother   ? Kidney disease Mother   ? Heart disease Father   ? Hypertension Father   ? Diabetes Sister   ? Diabetes Sister   ? Hypercalcemia Neg Hx   ? ? ?Social History  ? ?Socioeconomic History  ? Marital status: Married  ?  Spouse name: Not on file  ? Number of children: Not on file  ? Years of education: Not on file  ? Highest education level: Not on file  ?Occupational History  ? Not on file  ?Tobacco Use  ? Smoking status: Never  ? Smokeless tobacco: Never  ?Vaping Use  ? Vaping Use: Never used  ?Substance and Sexual Activity  ? Alcohol use: No  ?  Alcohol/week: 0.0 standard drinks  ? Drug use: No  ? Sexual activity: Not on file  ?Other Topics Concern  ? Not on file  ?Social History Narrative  ? Not on file  ? ?Social Determinants of Health  ? ?Financial Resource Strain: Low Risk   ? Difficulty of Paying Living  Expenses: Not hard at all  ?Food Insecurity: No Food Insecurity  ? Worried About Programme researcher, broadcasting/film/video in the Last Year: Never true  ? Ran Out of Food in the Last Year: Never true  ?Transportation Needs: No Transportation Needs  ? Lack of Transportation (Medical): No  ? Lack of Transportation (Non-Medical): No  ?Physical Activity: Inactive  ? Days of Exercise per Week: 0 days  ? Minutes of Exercise per Session: 0 min  ?Stress: No Stress Concern Present  ? Feeling of Stress : Not at all  ?Social Connections: Moderately Integrated  ? Frequency of Communication with Friends and Family: More than three times a week  ? Frequency of Social Gatherings with Friends and Family: More than three times a week  ? Attends Religious Services:  1 to 4 times per year  ? Active Member of Clubs or Organizations: No  ? Attends Banker Meetings: Never  ? Marital Status: Married  ? ? ? ?There were no vitals filed for this visit. ?There is no height or weight on file to calculate BMI. ? ? ?Wt Readings from Last 3 Encounters:  ?09/02/21 153 lb 3.2 oz (69.5 kg)  ?08/04/21 149 lb 2 oz (67.6 kg)  ?04/29/21 148 lb 6.4 oz (67.3 kg)  ? ? ?Physical Exam ? ?ASSESSMENT AND PLAN: ? ?There are no diagnoses linked to this encounter. ?No orders of the defined types were placed in this encounter. ? ? ? ?No problem-specific Assessment & Plan notes found for this encounter. ? ? ? ?No follow-ups on file. ? ? ?[name]. [place], MD ? ?[facility]. ?[place] office. ?

## 2022-01-20 ENCOUNTER — Other Ambulatory Visit: Payer: Self-pay

## 2022-01-20 ENCOUNTER — Encounter: Payer: Medicare Other | Admitting: Family Medicine

## 2022-01-20 DIAGNOSIS — E785 Hyperlipidemia, unspecified: Secondary | ICD-10-CM

## 2022-01-20 DIAGNOSIS — Z0000 Encounter for general adult medical examination without abnormal findings: Secondary | ICD-10-CM

## 2022-01-20 DIAGNOSIS — I700 Atherosclerosis of aorta: Secondary | ICD-10-CM

## 2022-01-20 DIAGNOSIS — I10 Essential (primary) hypertension: Secondary | ICD-10-CM

## 2022-02-01 NOTE — Progress Notes (Signed)
? ?HPI: ?[name] is a 87 y.o.male here today for his routine physical examination. ? ?Last CPE: 2021 ? ?Regular exercise: He does not have an exercise routine but " a lot" yard work for the past few weeks. ?Following a healthful diet: Salads and canned vegetables.He eats more chicken, little fish,and some beef. ? ?Chronic medical problems: primary hyperparathyroidism,ophthalmic migraine,HTN,OA, thoracic aneurysm,HLD,and nephrolithiasis among some. ? ?Immunization History  ?Administered Date(s) Administered  ? Fluad Quad(high Dose 65+) 10/02/2019, 08/01/2020, 08/04/2021  ? Influenza Whole 10/14/2004, 08/18/2007, 08/28/2008, 09/08/2009, 08/20/2010  ? Influenza, High Dose Seasonal PF 08/27/2013, 09/10/2015, 12/06/2016, 08/25/2017, 09/18/2018  ? Influenza,inj,Quad PF,6+ Mos 08/29/2014  ? PFIZER(Purple Top)SARS-COV-2 Vaccination 01/19/2020, 02/19/2020, 08/21/2020, 04/19/2021  ? Pneumococcal Conjugate-13 08/29/2014  ? Pneumococcal Polysaccharide-23 02/18/2005  ? Td 11/15/1998, 05/13/2008  ? Zoster, Live 06/03/2009  ? ? ?Health Maintenance  ?Topic Date Due  ? COVID-19 Vaccine (5 - Booster for [place] series) 02/05/2022 (Originally 06/14/2021)  ? Zoster Vaccines- Shingrix (1 of 2) 11/19/2022 (Originally 09/23/1987)  ? Pneumonia Vaccine 38+ Years old  Completed  ? INFLUENZA VACCINE  Completed  ? HPV VACCINES  Aged Out  ? TETANUS/TDAP  Discontinued  ? ?He follows with urology regularly, last seen a month ago. ?-Negative for high alcohol intake or tobacco use. ? ?-Concerns and/or follow up today:  ?Hyperlipidemia: Currently he is on Crestor 10 mg daily. ? ?Lab Results  ?Component Value Date  ? CHOL 175 08/01/2020  ? HDL 52 08/01/2020  ? LDLCALC 103 (H) 08/01/2020  ? LDLDIRECT 140.4 05/10/2007  ? TRIG 100 08/01/2020  ? CHOLHDL 3.4 08/01/2020  ? ?Hypertension on losartan 50 mg daily and amlodipine 5 mg daily. ?He did not tolerate metoprolol. ? ?Diastolic dysfunction seen on echo on 04/2020, grade 1. ?LVEF 65 to  70%. ?Negative for orthopnea and PND. ? ?Lab Results  ?Component Value Date  ? CREATININE 1.12 08/04/2021  ? BUN 14 08/04/2021  ? NA 142 08/04/2021  ? K 4.1 08/04/2021  ? CL 105 08/04/2021  ? CO2 31 08/04/2021  ? ?"Spot" on tongue for years. He thinks this could be a possible side effects of eye drops medication, has been reassured by eye care provider. ?He follows with dentist regularly.  He is also concerned about being a sign of liver disease. ?Hypercalcemia and primary hyperparathyroidism, he is no longer following with endocrinologist. ? ?Review of Systems  ?Constitutional:  Negative for activity change, appetite change and fever.  ?HENT:  Negative for nosebleeds and sore throat.   ?Eyes:  Negative for redness and visual disturbance.  ?Respiratory:  Negative for cough, shortness of breath and wheezing.   ?Cardiovascular:  Negative for chest pain, palpitations and leg swelling.  ?Gastrointestinal:  Negative for abdominal pain, blood in stool, nausea and vomiting.  ?Endocrine: Negative for cold intolerance, heat intolerance, polydipsia, polyphagia and polyuria.  ?Genitourinary:  Negative for decreased urine volume, dysuria, genital sores, hematuria and testicular pain.  ?Musculoskeletal:  Negative for gait problem and myalgias.  ?Skin:  Negative for color change and rash.  ?Neurological:  Negative for syncope, weakness and headaches.  ?Hematological:  Negative for adenopathy. Does not bruise/bleed easily.  ?Psychiatric/Behavioral:  Negative for confusion. The patient is not nervous/anxious.   ?All other systems reviewed and are negative. ? ?Current Outpatient Medications on File Prior to Visit  ?Medication Sig Dispense Refill  ? albuterol (VENTOLIN HFA) 108 (90 Base) MCG/ACT inhaler Inhale 2 puffs into the lungs every 6 (six) hours as needed for wheezing or shortness of  breath. 1 each 2  ? amLODipine (NORVASC) 5 MG tablet Take 1 tablet (5 mg total) by mouth at bedtime. 90 tablet 1  ? aspirin 81 MG EC tablet Take  81 mg by mouth every morning.    ? calcium carbonate (TUMS - DOSED IN MG ELEMENTAL CALCIUM) 500 MG chewable tablet Chew 1 tablet by mouth as needed for indigestion or heartburn. Reported on 11/19/2015    ? FLOVENT HFA 44 MCG/ACT inhaler INHALE 1 PUFF BY MOUTH INTO THE LUNGS TWICE DAILY 21.2 g 2  ? latanoprost (XALATAN) 0.005 % ophthalmic solution Place 1 drop into both eyes at bedtime.    ? losartan (COZAAR) 50 MG tablet Take 1 tablet (50 mg total) by mouth daily. 90 tablet 1  ? montelukast (SINGULAIR) 10 MG tablet TAKE 1 TABLET BY MOUTH EVERY NIGHT AT BEDTIME. GENERIC EQUIVALENT FOR SINGULAIR 90 tablet 1  ? Multiple Vitamin (MULTIVITAMIN) tablet Take 1 tablet by mouth every morning. Reported on 11/19/2015    ? pantoprazole (PROTONIX) 20 MG tablet TAKE 1 TABLET BY MOUTH EVERY DAY 30 tablet 1  ? polyvinyl alcohol (LIQUIFILM TEARS) 1.4 % ophthalmic solution Place 1 drop into both eyes daily as needed for dry eyes. Reported on 11/19/2015    ? rosuvastatin (CRESTOR) 10 MG tablet Take 1 tablet (10 mg total) by mouth daily. 90 tablet 3  ? Travoprost, BAK Free, (TRAVATAN) 0.004 % SOLN ophthalmic solution SMARTSIG:In Eye(s)    ? ?No current facility-administered medications on file prior to visit.  ? ?Past Medical History:  ?Diagnosis Date  ? Allergy   ? SEASONAL  ? Arthritis   ? Asthma   ? BPH (benign prostatic hypertrophy)   ? GERD (gastroesophageal reflux disease)   ? History of kidney stones   ? History of myasthenia gravis   ? 15 yrs ago - no current problem or treatment  ? Hypertension   ? Mild carotid artery disease ([place])   ? bilateral ICA 0-39%  per duplex 02-16-2013  ? Multiple pulmonary nodules   ? per last CT stable  ? Nephrolithiasis   ? BILATERAL  ? ? ?Past Surgical History:  ?Procedure Laterality Date  ? CATARACT EXTRACTION W/ INTRAOCULAR LENS  IMPLANT, BILATERAL  2007  ? COLONOSCOPY    ? COLONOSCOPY W/ POLYPECTOMY  05-14-2010  ? CYSTO/  LEFT RETROGRADE PYELOGRAM/  LEFT URETEROSCOPY/  LEFT STENT PLACEMENT   06-17-2008  ? CYSTOSCOPY W/ RETROGRADES Bilateral 03/24/2015  ? Procedure: CYSTOSCOPY WITH RETROGRADE PYELOGRAM;  Surgeon: Kathie Rhodes, MD;  Location: [facility];  Service: Urology;  Laterality: Bilateral;  ? CYSTOSCOPY WITH STENT PLACEMENT Right 03/24/2015  ? Procedure: CYSTOSCOPY WITH STENT PLACEMENT;  Surgeon: Kathie Rhodes, MD;  Location: [facility];  Service: Urology;  Laterality: Right;  ? CYSTOSCOPY WITH URETEROSCOPY Right 03/24/2015  ? Procedure: CYSTOSCOPY WITH URETEROSCOPY;  Surgeon: Kathie Rhodes, MD;  Location: [facility];  Service: Urology;  Laterality: Right;  ? EXTRACORPOREAL SHOCK WAVE LITHOTRIPSY Bilateral left 02-23-2010 //   right 09-28-2010  ? HOLMIUM LASER APPLICATION Right [id number]  ? Procedure: HOLMIUM LASER APPLICATION;  Surgeon: Kathie Rhodes, MD;  Location: [facility];  Service: Urology;  Laterality: Right;  ? INGUINAL HERNIA REPAIR Bilateral 2002 approx.  ? NEPHROLITHOTOMY Left 05/30/2015  ? Procedure: LEFT PERCUTANEOUS NEPHROLITHOTOMY;  Surgeon: Kathie Rhodes, MD;  Location: [facility];  Service: Urology;  Laterality: Left;  ? POLYPECTOMY    ? STONE EXTRACTION WITH BASKET Right 03/24/2015  ? Procedure: STONE  EXTRACTION WITH BASKET;  Surgeon: Kathie Rhodes, MD;  Location[facility];  Service: Urology;  Laterality: Right;  ? ?Allergies  ?Allergen Reactions  ? Lisinopril Swelling  ? ? ?Family History  ?Problem Relation Age of Onset  ? Heart disease Mother   ? Hypertension Mother   ? Kidney disease Mother   ? Heart disease Father   ? Hypertension Father   ? Diabetes Sister   ? Diabetes Sister   ? Hypercalcemia Neg Hx   ? ? ?Social History  ? ?Socioeconomic History  ? Marital status: Married  ?  Spouse name: Not on file  ? Number of children: Not on file  ? Years of education: Not on file  ? Highest education level: Not on file  ?Occupational History  ? Not on file  ?Tobacco Use  ? Smoking status: Never  ? Smokeless tobacco: Never   ?Vaping Use  ? Vaping Use: Never used  ?Substance and Sexual Activity  ? Alcohol use: No  ?  Alcohol/week: 0.0 standard drinks  ? Drug use: No  ? Sexual activity: Not on file  ?Other Topics Concern  ? Not on file

## 2022-02-02 ENCOUNTER — Ambulatory Visit (INDEPENDENT_AMBULATORY_CARE_PROVIDER_SITE_OTHER): Payer: Medicare Other | Admitting: Family Medicine

## 2022-02-02 ENCOUNTER — Encounter: Payer: Self-pay | Admitting: Family Medicine

## 2022-02-02 VITALS — BP 136/80 | HR 85 | Resp 16 | Ht 65.0 in | Wt 158.1 lb

## 2022-02-02 DIAGNOSIS — I7121 Aneurysm of the ascending aorta, without rupture: Secondary | ICD-10-CM | POA: Diagnosis not present

## 2022-02-02 DIAGNOSIS — Z0000 Encounter for general adult medical examination without abnormal findings: Secondary | ICD-10-CM | POA: Diagnosis not present

## 2022-02-02 DIAGNOSIS — E785 Hyperlipidemia, unspecified: Secondary | ICD-10-CM

## 2022-02-02 DIAGNOSIS — E210 Primary hyperparathyroidism: Secondary | ICD-10-CM | POA: Diagnosis not present

## 2022-02-02 DIAGNOSIS — I10 Essential (primary) hypertension: Secondary | ICD-10-CM

## 2022-02-02 DIAGNOSIS — I5032 Chronic diastolic (congestive) heart failure: Secondary | ICD-10-CM

## 2022-02-02 DIAGNOSIS — K148 Other diseases of tongue: Secondary | ICD-10-CM

## 2022-02-02 LAB — HEPATIC FUNCTION PANEL
ALT: 26 U/L (ref 0–53)
AST: 36 U/L (ref 0–37)
Albumin: 4.5 g/dL (ref 3.5–5.2)
Alkaline Phosphatase: 94 U/L (ref 39–117)
Bilirubin, Direct: 0.1 mg/dL (ref 0.0–0.3)
Total Bilirubin: 0.5 mg/dL (ref 0.2–1.2)
Total Protein: 7.7 g/dL (ref 6.0–8.3)

## 2022-02-02 LAB — LIPID PANEL
Cholesterol: 167 mg/dL (ref 0–200)
HDL: 51.8 mg/dL (ref >39.00)
LDL Cholesterol: 97 mg/dL (ref 0–99)
NonHDL: 114.88
Total CHOL/HDL Ratio: 3
Triglycerides: 87 mg/dL (ref 0.0–149.0)
VLDL: 17.4 mg/dL (ref 0.0–40.0)

## 2022-02-02 LAB — BASIC METABOLIC PANEL
BUN: 14 mg/dL (ref 6–23)
CO2: 28 mEq/L (ref 19–32)
Calcium: 10.7 mg/dL — ABNORMAL HIGH (ref 8.4–10.5)
Chloride: 104 mEq/L (ref 96–112)
Creatinine, Ser: 1.02 mg/dL (ref 0.40–1.50)
GFR: 67.56 mL/min (ref >60.00)
Glucose, Bld: 98 mg/dL (ref 70–99)
Potassium: 3.5 mEq/L (ref 3.5–5.1)
Sodium: 140 mEq/L (ref 135–145)

## 2022-02-02 LAB — VITAMIN D 25 HYDROXY (VIT D DEFICIENCY, FRACTURES): VITD: 39.56 ng/mL (ref 30.00–100.00)

## 2022-02-02 LAB — PHOSPHORUS: Phosphorus: 2.1 mg/dL — ABNORMAL LOW (ref 2.3–4.6)

## 2022-02-02 MED ORDER — SHINGRIX 50 MCG/0.5ML IM SUSR: INTRAMUSCULAR | 1 refills | Status: DC

## 2022-02-02 NOTE — Patient Instructions (Addendum)
A few things to remember from today's visit: ? ?Routine general medical examination at a health care facility ? ?Essential hypertension - Plan: Basic metabolic panel ? ?Dyslipidemia (high LDL; low HDL) - Plan: Lipid panel, Hepatic function panel ? ?Aneurysm of ascending aorta without rupture - Plan: CT ANGIO CHEST AORTA W/CM & OR WO/CM ? ?Vitamin D deficiency, unspecified - Plan: VITAMIN D 25 Hydroxy (Vit-D Deficiency, Fractures) ? ?Primary hyperparathyroidism ([place]) - Plan: Parathyroid hormone, intact (no Ca), Calcium, ionized, Phosphorus ? ?If you need refills please call your pharmacy. ?Do not use My Chart to request refills or for acute issues that need immediate attention. ?  ?Please be sure medication list is accurate. ?If a new problem present, please set up appointment sooner than planned today. ? ?Preventive Care 11 Years and Older, Male ?Preventive care refers to lifestyle choices and visits with your health care provider that can promote health and wellness. Preventive care visits are also called wellness exams. ?What can I expect for my preventive care visit? ?Counseling ?During your preventive care visit, your health care provider may ask about your: ?Medical history, including: ?Past medical problems. ?Family medical history. ?History of falls. ?Current health, including: ?Emotional well-being. ?Home life and relationship well-being. ?Sexual activity. ?Memory and ability to understand (cognition). ?Lifestyle, including: ?Alcohol, nicotine or tobacco, and drug use. ?Access to firearms. ?Diet, exercise, and sleep habits. ?Work and work Statistician. ?Sunscreen use. ?Safety issues such as seatbelt and bike helmet use. ?Physical exam ?Your health care provider will check your: ?Height and weight. These may be used to calculate your BMI (body mass index). BMI is a measurement that tells if you are at a healthy weight. ?Waist circumference. This measures the distance around your waistline. This measurement  also tells if you are at a healthy weight and may help predict your risk of certain diseases, such as type 2 diabetes and high blood pressure. ?Heart rate and blood pressure. ?Body temperature. ?Skin for abnormal spots. ?What immunizations do I need? ?Vaccines are usually given at various ages, according to a schedule. Your health care provider will recommend vaccines for you based on your age, medical history, and lifestyle or other factors, such as travel or where you work. ?What tests do I need? ?Screening ?Your health care provider may recommend screening tests for certain conditions. This may include: ?Lipid and cholesterol levels. ?Diabetes screening. This is done by checking your blood sugar (glucose) after you have not eaten for a while (fasting). ?Hepatitis C test. ?Hepatitis B test. ?HIV (human immunodeficiency virus) test. ?STI (sexually transmitted infection) testing, if you are at risk. ?Lung cancer screening. ?Colorectal cancer screening. ?Prostate cancer screening. ?Abdominal aortic aneurysm (AAA) screening. You may need this if you are a current or former smoker. ?Talk with your health care provider about your test results, treatment options, and if necessary, the need for more tests. ?Follow these instructions at home: ?Eating and drinking ? ?Eat a diet that includes fresh fruits and vegetables, whole grains, lean protein, and low-fat dairy products. Limit your intake of foods with high amounts of sugar, saturated fats, and salt. ?Take vitamin and mineral supplements as recommended by your health care provider. ?Do not drink alcohol if your health care provider tells you not to drink. ?If you drink alcohol: ?Limit how much you have to 0-2 drinks a day. ?Know how much alcohol is in your drink. In the [place] one drink equals one 12 oz bottle of beer (355 mL), one 5 oz glass of wine (148  mL), or one 1? oz glass of hard liquor (44 mL). ?Lifestyle ?Brush your teeth every morning and night with fluoride  toothpaste. Floss one time each day. ?Exercise for at least 30 minutes 5 or more days each week. ?Do not use any products that contain nicotine or tobacco. These products include cigarettes, chewing tobacco, and vaping devices, such as e-cigarettes. If you need help quitting, ask your health care provider. ?Do not use drugs. ?If you are sexually active, practice safe sex. Use a condom or other form of protection to prevent STIs. ?Take aspirin only as told by your health care provider. Make sure that you understand how much to take and what form to take. Work with your health care provider to find out whether it is safe and beneficial for you to take aspirin daily. ?Ask your health care provider if you need to take a cholesterol-lowering medicine (statin). ?Find healthy ways to manage stress, such as: ?Meditation, yoga, or listening to music. ?Journaling. ?Talking to a trusted person. ?Spending time with friends and family. ?Safety ?Always wear your seat belt while driving or riding in a vehicle. ?Do not drive: ?If you have been drinking alcohol. Do not ride with someone who has been drinking. ?When you are tired or distracted. ?While texting. ?If you have been using any mind-altering substances or drugs. ?Wear a helmet and other protective equipment during sports activities. ?If you have firearms in your house, make sure you follow all gun safety procedures. ?Minimize exposure to UV radiation to reduce your risk of skin cancer. ?What's next? ?Visit your health care provider once a year for an annual wellness visit. ?Ask your health care provider how often you should have your eyes and teeth checked. ?Stay up to date on all vaccines. ?This information is not intended to replace advice given to you by your health care provider. Make sure you discuss any questions you have with your health care provider. ?Document Revised: 04/29/2021 Document Reviewed: 04/29/2021 ?Elsevier Patient Education ? 2022 [employer]. ? ? ? ? ? ? ?

## 2022-02-02 NOTE — Assessment & Plan Note (Signed)
First noted in 04/2020 with echo. ?Chest CTA order placed. ?We discussed the importance of maintaining a adequately controlled BP. ?He was clearly instructed about warning signs. ? ?

## 2022-02-02 NOTE — Assessment & Plan Note (Signed)
BP adequately controlled. ?Continue losartan 50 mg daily and amlodipine 5 mg daily. ?Low-salt diet recommended. ?Continue monitoring BP regularly. ?

## 2022-02-02 NOTE — Assessment & Plan Note (Signed)
Problem has been stable. ?We will continue following annually. ?

## 2022-02-02 NOTE — Assessment & Plan Note (Signed)
Continue Crestor 10 mg daily and low fat diet. Further recommendation will be given according to lipid panel result. 

## 2022-02-02 NOTE — Assessment & Plan Note (Signed)
Diastolic dysfunction seen on echo in 04/2020, grade 1. ?LVEF 65 to 70%. ?Asymptomatic. ? ?

## 2022-02-03 LAB — CALCIUM, IONIZED: Calcium, Ion: 5.7 mg/dL — ABNORMAL HIGH (ref 4.7–5.5)

## 2022-02-03 LAB — PARATHYROID HORMONE, INTACT (NO CA): PTH: 82 pg/mL — ABNORMAL HIGH (ref 16–77)

## 2022-02-09 ENCOUNTER — Other Ambulatory Visit: Payer: Self-pay | Admitting: Family Medicine

## 2022-02-09 DIAGNOSIS — N202 Calculus of kidney with calculus of ureter: Secondary | ICD-10-CM | POA: Diagnosis not present

## 2022-02-09 DIAGNOSIS — R310 Gross hematuria: Secondary | ICD-10-CM | POA: Diagnosis not present

## 2022-02-09 DIAGNOSIS — N210 Calculus in bladder: Secondary | ICD-10-CM | POA: Diagnosis not present

## 2022-02-09 DIAGNOSIS — N201 Calculus of ureter: Secondary | ICD-10-CM | POA: Diagnosis not present

## 2022-02-09 DIAGNOSIS — I10 Essential (primary) hypertension: Secondary | ICD-10-CM

## 2022-02-11 ENCOUNTER — Telehealth: Payer: Self-pay | Admitting: Cardiology

## 2022-02-11 NOTE — Telephone Encounter (Signed)
Patient calling to see if it would be okay to have two procedure on the same day. Please advise ?

## 2022-02-11 NOTE — Telephone Encounter (Signed)
Called pt and discussed scans. He states that he is unsure what procedure that is scheduled on the same day as the CT Angio. He will call ordering provider to discuss. ?

## 2022-02-15 ENCOUNTER — Ambulatory Visit (INDEPENDENT_AMBULATORY_CARE_PROVIDER_SITE_OTHER)
Admission: RE | Admit: 2022-02-15 | Discharge: 2022-02-15 | Disposition: A | Discharge status: Home / Self Care | Payer: Medicare Other | Source: Ambulatory Visit | Attending: Family Medicine | Admitting: Family Medicine

## 2022-02-15 DIAGNOSIS — I7121 Aneurysm of the ascending aorta, without rupture: Secondary | ICD-10-CM

## 2022-02-15 MED ORDER — IOHEXOL 350 MG/ML SOLN
100.0000 mL | Freq: Once | INTRAVENOUS | Status: AC | PRN
  Administered 2022-02-15: 100 mL via INTRAVENOUS

## 2022-02-22 ENCOUNTER — Telehealth: Payer: Self-pay | Admitting: Pharmacist

## 2022-02-22 NOTE — Progress Notes (Signed)
? ?Chronic Care Management ?Pharmacy Note ? ?02/23/2022 ?Name:  [name] MRN:  [id number] DOB:  December 21, 1934 ? ?Summary: ?LDL not at goal < 70 ?BP at goal < 140/90 per home readings ? ?Recommendations/Changes made from today's visit: ?-Recommended restarting rosuvastatin as patient stopped taking ?-Recommend repeat lipid panel in 2-3 months and consider increasing rosuvastatin if LDL is above goal < 70 ? ?Plan: ?Tolerance assessment for restarting rosuvastatin in 1 month ?Follow up in 6 months ? ?Subjective: ?[name] is an 87 y.o. year old male who is a primary patient of [name], [name], MD.  The CCM team was consulted for assistance with disease management and care coordination needs.   ? ?Engaged with patient by telephone for follow up visit in response to provider referral for pharmacy case management and/or care coordination services.  ? ?Consent to Services:  ?The patient was given information about Chronic Care Management services, agreed to services, and gave verbal consent prior to initiation of services.  Please see initial visit note for detailed documentation.  ? ?Patient Care Team: ?[name], [name], MD as PCP - General (Family Medicine) ?[name], [facility] as Pharmacist (Pharmacist) ? ?Recent office visits: ?02/02/22 Betty [name], MD: Patient presented for annual exam. ? ?08/04/21 [name], LPN: Patient presented for AWV. ? ?Recent consult visits: ?09/02/21 Janina Mayo (Cardiology) - Patient presented for Palpitations. No medication changes.  ? ?Hospital visits: ?None in previous 6 months ? ? ?Objective: ? ?Lab Results  ?Component Value Date  ? CREATININE 1.02 02/02/2022  ? BUN 14 02/02/2022  ? GFR 67.56 02/02/2022  ? GFRNONAA 63 08/01/2020  ? GFRAA 73 08/01/2020  ? NA 140 02/02/2022  ? K 3.5 02/02/2022  ? CALCIUM 10.7 (H) 02/02/2022  ? CO2 28 02/02/2022  ? GLUCOSE 98 02/02/2022  ? ? ?Lab Results  ?Component Value Date/Time  ? GFR 67.56 02/02/2022 08:42 AM  ? GFR 60.60  08/04/2021 11:26 AM  ?  ?Last diabetic Eye exam: No results found for: HMDIABEYEEXA  ?Last diabetic Foot exam: No results found for: HMDIABFOOTEX  ? ?Lab Results  ?Component Value Date  ? CHOL 167 02/02/2022  ? HDL 51.80 02/02/2022  ? [place] 97 02/02/2022  ? LDLDIRECT 140.4 05/10/2007  ? TRIG 87.0 02/02/2022  ? CHOLHDL 3 02/02/2022  ? ? ? ?  Latest Ref Rng & Units 02/02/2022  ?  8:42 AM 08/04/2021  ? 11:26 AM 08/01/2020  ?  8:58 AM  ?Hepatic Function  ?Total Protein 6.0 - 8.3 g/dL 7.7   7.4   7.1    ?Albumin 3.5 - 5.2 g/dL 4.5   4.2     ?AST 0 - 37 U/L 36   34   24    ?ALT 0 - 53 U/L 26   23   17     ?Alk Phosphatase 39 - 117 U/L 94   74     ?Total Bilirubin 0.2 - 1.2 mg/dL 0.5   0.6   0.3    ?Bilirubin, Direct 0.0 - 0.3 mg/dL 0.1      ? ? ?Lab Results  ?Component Value Date/Time  ? TSH 1.22 08/04/2021 11:26 AM  ? TSH 2.08 12/05/2017 11:42 AM  ? ? ? ?  Latest Ref Rng & Units 08/04/2021  ? 11:26 AM 06/19/2019  ?  9:27 AM 12/13/2017  ?  2:00 PM  ?CBC  ?WBC 4.0 - 10.5 K/uL 6.3   6.7   10.3    ?Hemoglobin 13.0 - 17.0  g/dL 13.5   14.0   15.1    ?Hematocrit 39.0 - 52.0 % 41.0   41.3   43.1    ?Platelets 150.0 - 400.0 K/uL 208.0   204.0   144    ? ? ?Lab Results  ?Component Value Date/Time  ? VD25OH 39.56 02/02/2022 08:42 AM  ? VD25OH 37.05 12/30/2020 12:20 PM  ? ? ?Clinical ASCVD: Yes  ?The ASCVD Risk score (Arnett DK, et al., 2019) failed to calculate for the following reasons: ?  The 2019 ASCVD risk score is only valid for ages 61 to 75   ? ? ?  02/02/2022  ?  7:25 PM 08/04/2021  ? 11:38 AM 08/04/2021  ? 10:56 AM  ?Depression screen PHQ 2/9  ?Decreased Interest 0 0 0  ?Down, Depressed, Hopeless 0 0 0  ?PHQ - 2 Score 0 0 0  ?  ? ?Social History  ? ?Tobacco Use  ?Smoking Status Never  ?Smokeless Tobacco Never  ? ?BP Readings from Last 3 Encounters:  ?02/02/22 136/80  ?09/02/21 122/80  ?08/04/21 128/80  ? ?Pulse Readings from Last 3 Encounters:  ?02/02/22 85  ?09/02/21 69  ?08/04/21 (!) 53  ? ?Wt Readings from Last 3 Encounters:   ?02/02/22 158 lb 2 oz (71.7 kg)  ?09/02/21 153 lb 3.2 oz (69.5 kg)  ?08/04/21 149 lb 2 oz (67.6 kg)  ? ?BMI Readings from Last 3 Encounters:  ?02/02/22 26.31 kg/m?  ?09/02/21 25.49 kg/m?  ?08/04/21 25.60 kg/m?  ? ? ?Assessment/Interventions: Review of patient past medical history, allergies, medications, health status, including review of consultants reports, laboratory and other test data, was performed as part of comprehensive evaluation and provision of chronic care management services.  ? ?SDOH:  (Social Determinants of Health) assessments and interventions performed: No ? ? ?SDOH Screenings  ? ?Alcohol Screen: Not on file  ?Depression (PHQ2-9): Low Risk   ? PHQ-2 Score: 0  ?Financial Resource Strain: Low Risk   ? Difficulty of Paying Living Expenses: Not hard at all  ?Food Insecurity: No Food Insecurity  ? Worried About Charity fundraiser in the Last Year: Never true  ? Ran Out of Food in the Last Year: Never true  ?Housing: Low Risk   ? Last Housing Risk Score: 0  ?Physical Activity: Inactive  ? Days of Exercise per Week: 0 days  ? Minutes of Exercise per Session: 0 min  ?Social Connections: Moderately Integrated  ? Frequency of Communication with Friends and Family: More than three times a week  ? Frequency of Social Gatherings with Friends and Family: More than three times a week  ? Attends Religious Services: 1 to 4 times per year  ? Active Member of Clubs or Organizations: No  ? Attends Archivist Meetings: Never  ? Marital Status: Married  ?Stress: No Stress Concern Present  ? Feeling of Stress : Not at all  ?Tobacco Use: Low Risk   ? Smoking Tobacco Use: Never  ? Smokeless Tobacco Use: Never  ? Passive Exposure: Not on file  ?Transportation Needs: No Transportation Needs  ? Lack of Transportation (Medical): No  ? Lack of Transportation (Non-Medical): No  ? ?CCM Care Plan ? ?Allergies  ?Allergen Reactions  ? Lisinopril Swelling  ? ? ?Medications Reviewed Today   ? ? Reviewed by [name], [place] (Pharmacist) on 02/23/22 at 1059  Med List Status: <None>  ? ?Medication Order Taking? Sig Documenting Provider Last Dose Status Informant  ?albuterol (VENTOLIN HFA) 108 (90  Base) MCG/ACT inhaler [id number] Yes Inhale 2 puffs into the lungs every 6 (six) hours as needed for wheezing or shortness of breath. [name], [name], MD Taking Active   ?amLODipine (NORVASC) 5 MG tablet [id number] Yes Take 1 tablet (5 mg total) by mouth at bedtime. [name], [name], MD Taking Active   ?aspirin 81 MG EC tablet [id number] Yes Take 81 mg by mouth every morning. [provider] Taking Active Self  ?calcium carbonate (TUMS - DOSED IN MG ELEMENTAL CALCIUM) 500 MG chewable tablet [id number] Yes Chew 1 tablet by mouth as needed for indigestion or heartburn. Reported on 11/19/2015 [provider] Taking Active Self  ?FLOVENT HFA 44 MCG/ACT inhaler [id number] Yes INHALE 1 PUFF BY MOUTH INTO THE LUNGS TWICE DAILY [name], [name], MD Taking Active   ?losartan (COZAAR) 50 MG tablet [id number] Yes TAKE 1 TABLET BY MOUTH DAILY [name], [name], MD Taking Active   ?montelukast (SINGULAIR) 10 MG tablet [id number] Yes TAKE 1 TABLET BY MOUTH EVERY NIGHT AT BEDTIME. GENERIC EQUIVALENT FOR Laurine Blazer [name], [name], MD Taking Active   ?Multiple Vitamin (MULTIVITAMIN) tablet [id number] Yes Take 1 tablet by mouth every morning. Reported on 11/19/2015 [provider] Taking Active Self  ?polyvinyl alcohol (LIQUIFILM TEARS) 1.4 % ophthalmic solution [id number] Yes Place 1 drop into both eyes daily as needed for dry eyes. Reported on 11/19/2015 [provider] Taking Active Self  ?rosuvastatin (CRESTOR) 10 MG tablet [id number] No Take 1 tablet (10 mg total) by mouth daily.  ?Patient not taking: Reported on 02/23/2022  ? [name], [name], MD Not Taking Active   ?Travoprost, BAK Free, (TRAVATAN) 0.004 % SOLN ophthalmic solution [id number] Yes Place 1 drop into both eyes at bedtime. [provider] Taking Active   ? ?  ?  ? ?   ? ? ?Patient Active Problem List  ? Diagnosis Date Noted  ? Vitamin D deficiency, unspecified 12/30/2020  ? Thoracic aortic aneurysm ([place]) 08/01/2020  ? (HFpEF) heart failure with preserved ejection fraction

## 2022-02-22 NOTE — Chronic Care Management (AMB) (Signed)
? ? ?  Chronic Care Management ?Pharmacy Assistant  ? ?Name: [name]  MRN: [id number] DOB: 18-Aug-1935 ? ?02/22/22 APPOINTMENT REMINDER ? ? ?Patient was reminded to have all medications, supplements and any blood glucose and blood pressure readings available for review with Jeni Salles, Pharm. D, for telephone visit on 02/23/22 at 10:30. ? ? ? ?Care Gaps: ?BP- 136/80 (02/02/22) ?COVID Booster - Overdue ?AWV-4/23 ? ?Star Rating Drug: ?Losartan (Cozaar) 50 mg - Last filled 02/09/22 90 DS at [facility] ?Rosuvastatin (Crestor) 10 mg - Last filled 06/23/2021 90 DS at [facility] ?Verified as accurate ? ?Any gaps in medications fill history? ?Rosuvastatin (Crestor) 10 mg ? ? ? ? ? ? ?Medications: ?Outpatient Encounter Medications as of 02/22/2022  ?Medication Sig  ? albuterol (VENTOLIN HFA) 108 (90 Base) MCG/ACT inhaler Inhale 2 puffs into the lungs every 6 (six) hours as needed for wheezing or shortness of breath.  ? amLODipine (NORVASC) 5 MG tablet Take 1 tablet (5 mg total) by mouth at bedtime.  ? aspirin 81 MG EC tablet Take 81 mg by mouth every morning.  ? calcium carbonate (TUMS - DOSED IN MG ELEMENTAL CALCIUM) 500 MG chewable tablet Chew 1 tablet by mouth as needed for indigestion or heartburn. Reported on 11/19/2015  ? [place] HFA 44 MCG/ACT inhaler INHALE 1 PUFF BY MOUTH INTO THE LUNGS TWICE DAILY  ? latanoprost (XALATAN) 0.005 % ophthalmic solution Place 1 drop into both eyes at bedtime.  ? losartan (COZAAR) 50 MG tablet TAKE 1 TABLET BY MOUTH DAILY  ? montelukast (SINGULAIR) 10 MG tablet TAKE 1 TABLET BY MOUTH EVERY NIGHT AT BEDTIME. GENERIC EQUIVALENT FOR SINGULAIR  ? Multiple Vitamin (MULTIVITAMIN) tablet Take 1 tablet by mouth every morning. Reported on 11/19/2015  ? pantoprazole (PROTONIX) 20 MG tablet TAKE 1 TABLET BY MOUTH EVERY DAY  ? polyvinyl alcohol (LIQUIFILM TEARS) 1.4 % ophthalmic solution Place 1 drop into both eyes daily as needed for dry eyes. Reported on 11/19/2015  ? rosuvastatin (CRESTOR) 10 MG  tablet Take 1 tablet (10 mg total) by mouth daily.  ? Travoprost, BAK Free, (TRAVATAN) 0.004 % SOLN ophthalmic solution SMARTSIG:In Eye(s)  ? Zoster Vaccine Adjuvanted [facility]) injection 0.5 ml in muscle and repeat in 8 weeks  ? ?No facility-administered encounter medications on file as of 02/22/2022.  ? ? ? ?Ned Clines CMA ?Clinical Pharmacist Assistant ?[phone] ? ? ?

## 2022-02-23 ENCOUNTER — Ambulatory Visit (INDEPENDENT_AMBULATORY_CARE_PROVIDER_SITE_OTHER): Payer: Medicare Other | Admitting: Pharmacist

## 2022-02-23 DIAGNOSIS — I10 Essential (primary) hypertension: Secondary | ICD-10-CM

## 2022-02-23 DIAGNOSIS — E785 Hyperlipidemia, unspecified: Secondary | ICD-10-CM

## 2022-02-23 NOTE — Patient Instructions (Signed)
Hi Ed, ? ?It was great to catch up with you again! Like we discussed, go ahead and restart taking the rosuvastatin and if you want to start out with every other day and work up to every day that's ok for a couple of weeks. ? ?Please reach out to me if you have any questions or need anything before our follow up! ? ?Best, ?Maddie ? ?Jeni Salles, PharmD, BCACP ?Clinical Pharmacist ?Therapist, music at [place] ?[phone] ? ? Visit Information ? ? Goals Addressed   ?None ?  ? ?Patient Care Plan: [place]  ?  ? ?Problem Identified: Problem: Hypertension, Hyperlipidemia, GERD, Asthma, and Allergic Rhinitis   ?  ? ?Long-Range Goal: Patient-Specific Goal   ?Start Date: 06/11/2021  ?Expected End Date: 06/11/2022  ?Recent Progress: On track  ?Priority: High  ?Note:   ?Current Barriers:  ?Unable to achieve control of blood pressure  ?Suboptimal therapeutic regimen for cholesterol ? ?Pharmacist Clinical Goal(s):  ?Patient will achieve control of blood pressure as evidenced by home and office blood pressure readings ?adhere to plan to optimize therapeutic regimen for cholesterol as evidenced by report of adherence to recommended medication management changes through collaboration with PharmD and provider.  ? ?Interventions: ?1:1 collaboration with [name], [name], MD regarding development and update of comprehensive plan of care as evidenced by provider attestation and co-signature ?Inter-disciplinary care team collaboration (see longitudinal plan of care) ?Comprehensive medication review performed; medication list updated in electronic medical record ? ?Hypertension (BP goal <140/90) ?-Controlled ?-Current treatment: ?Losartan 50 mg 1 tablet daily - Appropriate, Effective, Safe, Accessible ?Amlodipine 5 mg 1 tablet daily - Appropriate, Effective, Safe, Accessible ?-Medications previously tried: amlodipine (blurry vision), lisinopril-HCTZ   ?-Current home readings:  134/71 pulse 59  - average of last 30  days; (checking 3 times a week), highest of 159/74; 126/72; 120-130s ?-Current dietary habits: eating out more often; not limiting salt much; has switched to less salty foods; does not read package labels ?-Current exercise habits: more active with home repairs ?-Denies hypotensive/hypertensive symptoms ?-Educated on BP goals and benefits of medications for prevention of heart attack, stroke and kidney damage; ?Exercise goal of 150 minutes per week; ?Importance of home blood pressure monitoring; ?Proper BP monitoring technique; ?-Counseled to monitor BP at home daily, document, and provide log at future appointments ?-Counseled on diet and exercise extensively ?Recommended to continue current medication ? ?Hyperlipidemia: (LDL goal < 70) ?-Uncontrolled ?-Current treatment: ?Rosuvastatin 10 mg 1 tablet daily - not taking ?-Medications previously tried: fish oil (not needed) ?-Current dietary patterns: limiting fried foods ?-Current exercise habits: active around the house ?-Educated on Cholesterol goals;  ?Benefits of statin for ASCVD risk reduction; ?Importance of limiting foods high in cholesterol; ?Exercise goal of 150 minutes per week; ?-Counseled on diet and exercise extensively ?Recommended restarting rosuvastatin as patient stopped taking. ? ?Aortic atherosclerosis (Goal: prevent heart events) ?-Not ideally controlled ?-Current treatment  ?Rosuvastatin 10 mg 1 tablet daily - not taking ?Aspirin 81 mg 1 tablet daily - Appropriate, Effective, Safe, Accessible ?-Medications previously tried: none  ?-Recommended to continue current medication ? ? ?Asthma (Goal: control symptoms) ?-Controlled ?-Current treatment  ?Flovent 44 mcg/act 2 puffs once daily - Appropriate, Effective, Safe, Accessible ?Albuterol HFA as needed - Appropriate, Effective, Safe, Accessible ?Montelukast 10 mg 1 tablet at bedtime - Appropriate, Effective, Safe, Accessible ?-Medications previously tried: none  ?-Pulmonary function testing:  n/a ?-Patient reports consistent use of maintenance inhaler ?-Frequency of rescue inhaler use: rare (once in 20 years) ?-  Counseled on Proper inhaler technique; ?Benefits of consistent maintenance inhaler use ?-Recommended to continue current medication ?Counseled on importance of rinsing with water and spitting after each use of Flovent. ? ?GERD (Goal: minimize symptoms) ?-Controlled ?-Current treatment  ?Tums as needed - Appropriate, Effective, Safe, Accessible ?-Medications previously tried: pantoprazole (not needed) ?-Counseled on non-pharmacologic management of symptoms such as elevating the head of your bed, avoiding eating 2-3 hours before bed, avoiding triggering foods such as acidic, spicy, or fatty foods, eating smaller meals, and wearing clothes that are loose around the waist ?Recommended use of Tums for quick relief ? ?Health Maintenance ?-Vaccine gaps: shingrix ?-Current therapy:  ?Multivitamin 1 tablet daily ?Liquifilm tears as needed ?Xalatan 1 drop in both eyes at bedtime ?-Educated on Cost vs benefit of each product must be carefully weighed by individual consumer ?-Patient is satisfied with current therapy and denies issues ?-Recommended to continue current medication ? ?Patient Goals/Self-Care Activities ?Patient will:  ?- take medications as prescribed ?check blood pressure daily, document, and provide at future appointments ?target a minimum of 150 minutes of moderate intensity exercise weekly ? ?Follow Up Plan: Telephone follow up appointment with care management team member scheduled for: 6 months ? ?  ?  ? ?Patient verbalizes understanding of instructions and care plan provided today and agrees to view in [place]. Active MyChart status confirmed with patient.   ?The pharmacy team will reach out to the patient again over the next 30 days.   ? ?[name], RPH  ?

## 2022-03-01 DIAGNOSIS — H401111 Primary open-angle glaucoma, right eye, mild stage: Secondary | ICD-10-CM | POA: Diagnosis not present

## 2022-03-01 DIAGNOSIS — H52223 Regular astigmatism, bilateral: Secondary | ICD-10-CM | POA: Diagnosis not present

## 2022-03-01 DIAGNOSIS — H5201 Hypermetropia, right eye: Secondary | ICD-10-CM | POA: Diagnosis not present

## 2022-03-01 DIAGNOSIS — H5212 Myopia, left eye: Secondary | ICD-10-CM | POA: Diagnosis not present

## 2022-03-02 ENCOUNTER — Other Ambulatory Visit: Payer: Self-pay | Admitting: Urology

## 2022-03-02 ENCOUNTER — Telehealth: Payer: Self-pay | Admitting: Internal Medicine

## 2022-03-02 NOTE — Telephone Encounter (Signed)
? ? ?  Name: [name]  ?DOB: [birth year]  ?MRN: [id number] ? ?Primary Cardiologist: Dr. Carolan Clines, last OV 08/2021 ? ? ?Preoperative team, please contact this patient and set up a phone call appointment for further preoperative risk assessment. Please obtain consent and complete medication review. Thank you for your help. ? ?Will not route to MD re: ASA as no specific conditions prohibit based on surgical hx. Can review/finalize recs at time of televisit. ? ? ? ?Laurann Montana, PA-C ?03/02/2022, 10:37 AM ?[facility] ?[address] ?[place], [place] [address] ? ? ?

## 2022-03-02 NOTE — Telephone Encounter (Signed)
? ?  Pre-operative Risk Assessment  ?  ?Patient Name: [name]  ?DOB: 05/21/1935 ?MRN: [id number]  ? ?  ? ?Request for Surgical Clearance   ? ?Procedure:   Cystoscopy With Litholapaxy Ureteroscopy, Laser Lithotripsy and Stint Placement ? ?Date of Surgery:  Clearance 03/12/22                              ?   ?Surgeon:  Dr. Jettie Pagan  ?Surgeon's Group or Practice Name:  [facility] ?Phone number:  [phone] Ext. [phone] ?Fax number:  [phone] ?  ?Type of Clearance Requested:   ?- Medical  ?- Pharmacy:  Hold Aspirin 5 Days Prior ?  ?Type of Anesthesia:  General  ?  ?Additional requests/questions:  Please fax a copy of medical clearance to the surgeon's office. ? ?Signed, ?Brayton El   ?03/02/2022, 9:10 AM   ?

## 2022-03-03 ENCOUNTER — Ambulatory Visit (INDEPENDENT_AMBULATORY_CARE_PROVIDER_SITE_OTHER): Payer: Medicare Other | Admitting: Physician Assistant

## 2022-03-03 ENCOUNTER — Telehealth: Payer: Self-pay | Admitting: *Deleted

## 2022-03-03 DIAGNOSIS — Z01810 Encounter for preprocedural cardiovascular examination: Secondary | ICD-10-CM

## 2022-03-03 NOTE — Telephone Encounter (Signed)
Tele pre op appt today at 4 pm. Med rec and consent are done.  ? ?  ?Patient Consent for Virtual Visit  ? ? ?   ? ?[name] has provided verbal consent on 03/03/2022 for a virtual visit (video or telephone). ? ? ?CONSENT FOR VIRTUAL VISIT FOR:  [name]  ?By participating in this virtual visit I agree to the following: ? ?I hereby voluntarily request, consent and authorize [facility] and its employed or contracted physicians, physician assistants, nurse practitioners or other licensed health care professionals (the Practitioner), to provide me with telemedicine health care services (the ?Services") as deemed necessary by the treating Practitioner. I acknowledge and consent to receive the Services by the Practitioner via telemedicine. I understand that the telemedicine visit will involve communicating with the Practitioner through live audiovisual communication technology and the disclosure of certain medical information by electronic transmission. I acknowledge that I have been given the opportunity to request an in-person assessment or other available alternative prior to the telemedicine visit and am voluntarily participating in the telemedicine visit. ? ?I understand that I have the right to withhold or withdraw my consent to the use of telemedicine in the course of my care at any time, without affecting my right to future care or treatment, and that the Practitioner or I may terminate the telemedicine visit at any time. I understand that I have the right to inspect all information obtained and/or recorded in the course of the telemedicine visit and may receive copies of available information for a reasonable fee.  I understand that some of the potential risks of receiving the Services via telemedicine include:  ?Delay or interruption in medical evaluation due to technological equipment failure or disruption; ?Information transmitted may not be sufficient (e.g. poor resolution of images) to  allow for appropriate medical decision making by the Practitioner; and/or  ?In rare instances, security protocols could fail, causing a breach of personal health information. ? ?Furthermore, I acknowledge that it is my responsibility to provide information about my medical history, conditions and care that is complete and accurate to the best of my ability. I acknowledge that Practitioner's advice, recommendations, and/or decision may be based on factors not within their control, such as incomplete or inaccurate data provided by me or distortions of diagnostic images or specimens that may result from electronic transmissions. I understand that the practice of medicine is not an exact science and that Practitioner makes no warranties or guarantees regarding treatment outcomes. I acknowledge that a copy of this consent can be made available to me via my patient portal ([place]), or I can request a printed copy by calling the office of [place].   ? ?I understand that my insurance will be billed for this visit.  ? ?I have read or had this consent read to me. ?I understand the contents of this consent, which adequately explains the benefits and risks of the Services being provided via telemedicine.  ?I have been provided ample opportunity to ask questions regarding this consent and the Services and have had my questions answered to my satisfaction. ?I give my informed consent for the services to be provided through the use of telemedicine in my medical care ? ? ? ?

## 2022-03-03 NOTE — Progress Notes (Signed)
? ?Virtual Visit via Telephone Note  ? ?This visit type was conducted due to national recommendations for restrictions regarding the COVID-19 Pandemic (e.g. social distancing) in an effort to limit this patient's exposure and mitigate transmission in our community.  Due to his co-morbid illnesses, this patient is at least at moderate risk for complications without adequate follow up.  This format is felt to be most appropriate for this patient at this time.  The patient did not have access to video technology/had technical difficulties with video requiring transitioning to audio format only (telephone).  All issues noted in this document were discussed and addressed.  No physical exam could be performed with this format.  Please refer to the patient's chart for his  consent to telehealth for [facility]. ? ?Evaluation Performed:  Preoperative cardiovascular risk assessment ?_____________  ? ?Date:  03/03/2022  ? ?Patient ID:  [name], [name] January 16, 1935, MRN [id number] ?Patient Location:  ?Home ?Provider location:   ?Office ? ?Primary Care Provider:  [name], [name], MD ?Primary Cardiologist:  Janina Mayo, MD ? ?Chief Complaint  ?  ?87 y.o. y/o male with a h/o HTN and irregular heart beat, who is pending  Cystoscopy With Litholapaxy Ureteroscopy, Laser Lithotripsy and Stint Placement, and presents today for telephonic preoperative cardiovascular risk assessment. ? ?Past Medical History  ?  ?Past Medical History:  ?Diagnosis Date  ? Allergy   ? SEASONAL  ? Arthritis   ? Asthma   ? BPH (benign prostatic hypertrophy)   ? GERD (gastroesophageal reflux disease)   ? History of kidney stones   ? History of myasthenia gravis   ? 15 yrs ago - no current problem or treatment  ? Hypertension   ? Mild carotid artery disease ([place])   ? bilateral ICA 0-39%  per duplex 02-16-2013  ? Multiple pulmonary nodules   ? per last CT stable  ? Nephrolithiasis   ? BILATERAL  ? ?Past Surgical History:  ?Procedure Laterality Date   ? CATARACT EXTRACTION W/ INTRAOCULAR LENS  IMPLANT, BILATERAL  2007  ? COLONOSCOPY    ? COLONOSCOPY W/ POLYPECTOMY  05-14-2010  ? CYSTO/  LEFT RETROGRADE PYELOGRAM/  LEFT URETEROSCOPY/  LEFT STENT PLACEMENT  06-17-2008  ? CYSTOSCOPY W/ RETROGRADES Bilateral 03/24/2015  ? Procedure: CYSTOSCOPY WITH RETROGRADE PYELOGRAM;  Surgeon: Kathie Rhodes, MD;  Location: [facility];  Service: Urology;  Laterality: Bilateral;  ? CYSTOSCOPY WITH STENT PLACEMENT Right 03/24/2015  ? Procedure: CYSTOSCOPY WITH STENT PLACEMENT;  Surgeon: Kathie Rhodes, MD;  Location: [facility];  Service: Urology;  Laterality: Right;  ? CYSTOSCOPY WITH URETEROSCOPY Right 03/24/2015  ? Procedure: CYSTOSCOPY WITH URETEROSCOPY;  Surgeon: Kathie Rhodes, MD;  Location: [facility];  Service: Urology;  Laterality: Right;  ? EXTRACORPOREAL SHOCK WAVE LITHOTRIPSY Bilateral left 02-23-2010 //   right 09-28-2010  ? HOLMIUM LASER APPLICATION Right [id number]  ? Procedure: HOLMIUM LASER APPLICATION;  Surgeon: Kathie Rhodes, MD;  Location: [facility];  Service: Urology;  Laterality: Right;  ? INGUINAL HERNIA REPAIR Bilateral 2002 approx.  ? NEPHROLITHOTOMY Left 05/30/2015  ? Procedure: LEFT PERCUTANEOUS NEPHROLITHOTOMY;  Surgeon: Kathie Rhodes, MD;  Location: [facility];  Service: Urology;  Laterality: Left;  ? POLYPECTOMY    ? STONE EXTRACTION WITH BASKET Right 03/24/2015  ? Procedure: STONE EXTRACTION WITH BASKET;  Surgeon: Kathie Rhodes, MD;  Location: [facility];  Service: Urology;  Laterality: Right;  ? ? ?Allergies ? ?Allergies  ?Allergen Reactions  ?  Lisinopril Swelling  ? ? ?History of Present Illness  ?  ?[name] is a 87 y.o. male who presents via audio/video conferencing for a telehealth visit today.  Pt was last seen in cardiology clinic on 09/02/21 by Dr. Harl Bowie.  At that time [name] was doing well.  The patient is now pending  Cystoscopy With Litholapaxy Ureteroscopy,  Laser Lithotripsy and Stint Placement.  Since his last visit, he remains active building decks and doing yard work. He does not have a history of ischemic heart disease or stroke. He takes ASA for prevention. He denies cardiac complaints. ? ? ?Home Medications  ?  ?Prior to Admission medications   ?Medication Sig Start Date End Date Taking? Authorizing Provider  ?albuterol (VENTOLIN HFA) 108 (90 Base) MCG/ACT inhaler Inhale 2 puffs into the lungs every 6 (six) hours as needed for wheezing or shortness of breath. 04/29/21   [name], [name], MD  ?amLODipine (NORVASC) 5 MG tablet Take 1 tablet (5 mg total) by mouth at bedtime. 08/04/21   [name], [name], MD  ?aspirin 81 MG EC tablet Take 81 mg by mouth every morning.    [provider]  ?calcium carbonate (TUMS - DOSED IN MG ELEMENTAL CALCIUM) 500 MG chewable tablet Chew 1 tablet by mouth as needed for indigestion or heartburn. Reported on 11/19/2015    [provider]  ?[place] HFA 44 MCG/ACT inhaler INHALE 1 PUFF BY MOUTH INTO THE LUNGS TWICE DAILY 01/23/21   [name], [name], MD  ?losartan (COZAAR) 50 MG tablet TAKE 1 TABLET BY MOUTH DAILY 02/09/22   [name], [name], MD  ?montelukast (SINGULAIR) 10 MG tablet TAKE 1 TABLET BY MOUTH EVERY NIGHT AT BEDTIME. GENERIC EQUIVALENT FOR SINGULAIR 08/24/21   [name], [name], MD  ?Multiple Vitamin (MULTIVITAMIN) tablet Take 1 tablet by mouth every morning. Reported on 11/19/2015    [provider]  ?polyvinyl alcohol (LIQUIFILM TEARS) 1.4 % ophthalmic solution Place 1 drop into both eyes daily as needed for dry eyes. Reported on 11/19/2015    [provider]  ?rosuvastatin (CRESTOR) 10 MG tablet Take 1 tablet (10 mg total) by mouth daily. ?Patient not taking: Reported on 02/23/2022 04/03/21   [name], [name], MD  ?Travoprost, BAK Free, (TRAVATAN) 0.004 % SOLN ophthalmic solution Place 1 drop into both eyes at bedtime. 01/13/22   [provider]  ? ? ?Physical Exam  ?  ?Vital Signs:  MANSON ZAHLER does not have vital signs available for review today. ? ?Given telephonic nature of communication, physical exam is limited. ?AAOx3. NAD. Normal affect.  Speech and respirations are unlabored. ? ?Accessory Clinical Findings  ?  ?None ? ?Assessment & Plan  ?  ?1.  Preoperative Cardiovascular Risk Assessment: ? ?He does not have a history of ischemic heart disease or stroke.  He can complete more than 4 METS without angina.  He takes aspirin for primary prevention.  According to the RCRI he has a 0.4% risk of Mace during the perioperative period.  He may hold aspirin 5 to 7 days prior to procedure. ? ?A copy of this note will be routed to requesting surgeon. ? ?Time:   ?Today, I have spent 8 minutes with the patient with telehealth technology discussing medical history, symptoms, and management plan.   ? ? ?Ledora Bottcher, PA ? ?03/03/2022, 4:21 PM ?

## 2022-03-03 NOTE — Telephone Encounter (Signed)
Tele pre op appt today at 4 pm. Med rec and consent are done.  ?

## 2022-03-04 ENCOUNTER — Telehealth: Payer: Medicare Other

## 2022-03-04 NOTE — Progress Notes (Signed)
Sent message, via epic in basket, requesting orders in epic from surgeon.  

## 2022-03-08 ENCOUNTER — Other Ambulatory Visit: Payer: Self-pay | Admitting: Family Medicine

## 2022-03-08 DIAGNOSIS — I7121 Aneurysm of the ascending aorta, without rupture: Secondary | ICD-10-CM

## 2022-03-08 DIAGNOSIS — I10 Essential (primary) hypertension: Secondary | ICD-10-CM

## 2022-03-09 NOTE — Progress Notes (Signed)
DUE TO COVID-19 ONLY  2 VISITOR IS ALLOWED TO COME WITH YOU AND STAY IN THE WAITING ROOM ONLY DURING PRE OP AND PROCEDURE DAY OF SURGERY.  2 4 VISITOR  MAY VISIT WITH YOU AFTER SURGERY IN YOUR PRIVATE ROOM DURING VISITING HOURS ONLY! ?YOU MAY HAVE ONE PERSON SPEND THE NITE WITH YOU IN YOUR ROOM AFTER SURGERY.   ? ? Your procedure is scheduled on:  ? 03/12/22  ? Report to [facility] Main  Entrance ? ? Report to admitting at          1215 PM         ?DO NOT BRING INSURANCE CARD, PICTURE ID OR WALLET DAY OF SURGERY.  ?  ? ? Call this number if you have problems the morning of surgery [phone]  ? ? REMEMBER: NO  SOLID FOODS , CANDY, GUM OR MINTS AFTER MIDNITE THE NITE BEFORE SURGERY .       Marland Kitchen CLEAR LIQUIDS UNTIL    1130 AM              DAY OF SURGERY.    ? ? ?CLEAR LIQUID DIET ? ? ?Foods Allowed      ?WATER ?BLACK COFFEE ( SUGAR OK, NO MILK, CREAM OR CREAMER) REGULAR AND DECAF  ?TEA ( SUGAR OK NO MILK, CREAM, OR CREAMER) REGULAR AND DECAF  ?PLAIN JELLO ( NO RED)  ?FRUIT ICES ( NO RED, NO FRUIT PULP)  ?POPSICLES ( NO RED)  ?JUICE- APPLE, WHITE GRAPE AND WHITE CRANBERRY  ?SPORT DRINK LIKE GATORADE ( NO RED)  ?CLEAR BROTH ( VEGETABLE , CHICKEN OR BEEF)                                                               ? ?    ? ?BRUSH YOUR TEETH MORNING OF SURGERY AND RINSE YOUR MOUTH OUT, NO CHEWING GUM CANDY OR MINTS. ?  ? ? Take these medicines the morning of surgery with A SIP OF WATER:  INHALERS AS USUAL AND BRING  ? ? ?DO NOT TAKE ANY DIABETIC MEDICATIONS DAY OF YOUR SURGERY ?                  ?            You may not have any metal on your body including hair pins and  ?            piercings  Do not wear jewelry, make-up, lotions, powders or perfumes, deodorant ?            Do not wear nail polish on your fingernails.   ?           IF YOU ARE A MALE AND WANT TO SHAVE UNDER ARMS OR LEGS PRIOR TO SURGERY YOU MUST DO SO AT LEAST 48 HOURS PRIOR TO SURGERY.  ?            Men may shave face and neck. ? ? Do not  bring valuables to the hospital. [place] NOT ?            RESPONSIBLE   FOR VALUABLES. ? Contacts, dentures or bridgework may not be worn into surgery. ? Leave suitcase in the car. After surgery it may be brought to  your room. ? ?  ? Patients discharged the day of surgery will not be allowed to drive home. IF YOU ARE HAVING SURGERY AND GOING HOME THE SAME DAY, YOU MUST HAVE AN ADULT TO DRIVE YOU HOME AND BE WITH YOU FOR 24 HOURS. YOU MAY GO HOME BY TAXI OR UBER OR ORTHERWISE, BUT AN ADULT MUST ACCOMPANY YOU HOME AND STAY WITH YOU FOR 24 HOURS. ?  ? ?            Please read over the following fact sheets you were given: ?_____________________________________________________________________ ? ?[place] - Preparing for Surgery ?Before surgery, you can play an important role.  Because skin is not sterile, your skin needs to be as free of germs as possible.  You can reduce the number of germs on your skin by washing with CHG (chlorahexidine gluconate) soap before surgery.  CHG is an antiseptic cleaner which kills germs and bonds with the skin to continue killing germs even after washing. ?Please DO NOT use if you have an allergy to CHG or antibacterial soaps.  If your skin becomes reddened/irritated stop using the CHG and inform your nurse when you arrive at Short Stay. ?Do not shave (including legs and underarms) for at least 48 hours prior to the first CHG shower.  You may shave your face/neck. ?Please follow these instructions carefully: ? 1.  Shower with CHG Soap the night before surgery and the  morning of Surgery. ? 2.  If you choose to wash your hair, wash your hair first as usual with your  normal  shampoo. ? 3.  After you shampoo, rinse your hair and body thoroughly to remove the  shampoo.                           4.  Use CHG as you would any other liquid soap.  You can apply chg directly  to the skin and wash  ?                     Gently with a scrungie or clean washcloth. ? 5.  Apply the CHG Soap  to your body ONLY FROM THE NECK DOWN.   Do not use on face/ open      ?                     Wound or open sores. Avoid contact with eyes, ears mouth and genitals (private parts).  ?                     Production manager,  Genitals (private parts) with your normal soap. ?            6.  Wash thoroughly, paying special attention to the area where your surgery  will be performed. ? 7.  Thoroughly rinse your body with warm water from the neck down. ? 8.  DO NOT shower/wash with your normal soap after using and rinsing off  the CHG Soap. ?               9.  Pat yourself dry with a clean towel. ?           10.  Wear clean pajamas. ?           11.  Place clean sheets on your bed the night of your first shower and do not  sleep with pets. ?Day of Surgery : ?  Do not apply any lotions/deodorants the morning of surgery.  Please wear clean clothes to the hospital/surgery center. ? ?FAILURE TO FOLLOW THESE INSTRUCTIONS MAY RESULT IN THE CANCELLATION OF YOUR SURGERY ?PATIENT SIGNATURE_________________________________ ? ?NURSE SIGNATURE__________________________________ ? ?________________________________________________________________________  ? ? ?           ?

## 2022-03-09 NOTE — Progress Notes (Signed)
Anesthesia Review: ? ?PCP: Betty [name] LOV 02/02/22  ?Cardiologist : DR Stanton Kidney branch- 4/19/23Levada Dy [facility] preop cardiovascular exam  ?Chest x-ray : ?CT Chest- 02/16/22  ?EKG : 09/02/21  ?Echo : 04/23/20  ?Stress test: ?Cardiac Cath :  ?Activity level:  ?Sleep Study/ CPAP : ?Fasting Blood Sugar :      / Checks Blood Sugar -- times a day:   ?Blood Thinner/ Instructions /Last Dose: ?ASA / Instructions/ Last Dose :   ?81 mg Aspirin  ?

## 2022-03-10 ENCOUNTER — Telehealth: Payer: Self-pay | Admitting: Family Medicine

## 2022-03-10 NOTE — Telephone Encounter (Signed)
I spoke with patient. We went over the information below & he verbalized understanding. I let him know that we'd be back in touch once you heard back from [place].  ?

## 2022-03-10 NOTE — Telephone Encounter (Signed)
I did send a message to Dr Cardell Peach, urologist, letting him know Mr Degeorge's concerns given the fact he has hx of thoracic aortic aneurysm. I have not receive a message back yet. ?I just sent a message to Micah Flesher, cardio PA, who did his pre op evaluation recently. ?Thanks, ?BJ ?

## 2022-03-10 NOTE — Telephone Encounter (Signed)
Pt calling with concerns regarding his kidney stone procedure scheduled for 03/12/22. States he spoke with the provider previously and she voiced concern and patient seeking guidance as to whether he should go forward with the procedure. Pt requesting a call with clarification ?

## 2022-03-11 ENCOUNTER — Inpatient Hospital Stay (HOSPITAL_COMMUNITY)
Admission: RE | Admit: 2022-03-11 | Discharge: 2022-03-11 | Disposition: A | Discharge status: Home / Self Care | Payer: Medicare Other | Source: Ambulatory Visit

## 2022-03-11 ENCOUNTER — Encounter (HOSPITAL_COMMUNITY): Payer: Self-pay

## 2022-03-11 DIAGNOSIS — Z01818 Encounter for other preprocedural examination: Secondary | ICD-10-CM

## 2022-03-12 ENCOUNTER — Ambulatory Visit (HOSPITAL_COMMUNITY): Admission: RE | Admit: 2022-03-12 | Payer: Medicare Other | Source: Home / Self Care | Admitting: Urology

## 2022-03-12 ENCOUNTER — Encounter (HOSPITAL_COMMUNITY): Admission: RE | Payer: Self-pay | Source: Home / Self Care

## 2022-03-12 DIAGNOSIS — Z01818 Encounter for other preprocedural examination: Secondary | ICD-10-CM

## 2022-03-12 SURGERY — CYSTOSCOPY, WITH BLADDER CALCULUS LITHOLAPAXY
Anesthesia: General | Laterality: Right

## 2022-03-14 DIAGNOSIS — I10 Essential (primary) hypertension: Secondary | ICD-10-CM | POA: Diagnosis not present

## 2022-03-14 DIAGNOSIS — E785 Hyperlipidemia, unspecified: Secondary | ICD-10-CM | POA: Diagnosis not present

## 2022-03-16 NOTE — Progress Notes (Signed)
? ?   ?[address] ?      [place] [address] ?            [phone]   ? ?   ? ?PCP is [place], [name], MD ?Referring Provider is [place], [name], MD ?Cardiologist: Dr. Carolan Clines MD ? ?Chief Complaint:Ascending thoracic aortic aneurysm ? ? ?HPI: ?This is an 87 year old male with a past medical history of hypertension, palpitations, hypertension, BPH, GERD,myasthenia gravis, COVID (2021), ?nephrolithiasis who was found to have an ascending thoracic aortic aneurysm that Measured 4.3 cm in March 2022.  ?He denies chest pain, pressure, or tightness and denies shortness of breath. He is fairly active. He states he has been doing a fair amount of work outside since [employer]. ? ?Past Medical History:  ?Diagnosis Date  ? Allergy   ? SEASONAL  ? Arthritis   ? Asthma   ? BPH (benign prostatic hypertrophy)   ? GERD (gastroesophageal reflux disease)   ? History of kidney stones   ? History of myasthenia gravis   ? 15 yrs ago - no current problem or treatment  ? Hypertension   ? Mild carotid artery disease (HCC)   ? bilateral ICA 0-39%  per duplex 02-16-2013  ? Multiple pulmonary nodules   ? per last CT stable  ? Nephrolithiasis   ? BILATERAL  ? ? ?Past Surgical History:  ?Procedure Laterality Date  ? CATARACT EXTRACTION W/ INTRAOCULAR LENS  IMPLANT, BILATERAL  2007  ? COLONOSCOPY    ? COLONOSCOPY W/ POLYPECTOMY  05-14-2010  ? CYSTO/  LEFT RETROGRADE PYELOGRAM/  LEFT URETEROSCOPY/  LEFT STENT PLACEMENT  06-17-2008  ? CYSTOSCOPY W/ RETROGRADES Bilateral 03/24/2015  ? Procedure: CYSTOSCOPY WITH RETROGRADE PYELOGRAM;  Surgeon: Ihor Gully, MD;  Location: [facility];  Service: Urology;  Laterality: Bilateral;  ? CYSTOSCOPY WITH STENT PLACEMENT Right 03/24/2015  ? Procedure: CYSTOSCOPY WITH STENT PLACEMENT;  Surgeon: Ihor Gully, MD;  Location: [facility];  Service: Urology;  Laterality: Right;  ? CYSTOSCOPY WITH URETEROSCOPY Right 03/24/2015  ? Procedure: CYSTOSCOPY WITH URETEROSCOPY;   Surgeon: Ihor Gully, MD;  Location: [facility];  Service: Urology;  Laterality: Right;  ? EXTRACORPOREAL SHOCK WAVE LITHOTRIPSY Bilateral left 02-23-2010 //   right 09-28-2010  ? HOLMIUM LASER APPLICATION Right 03/24/2015  ? Procedure: HOLMIUM LASER APPLICATION;  Surgeon: Ihor Gully, MD;  Location: [facility];  Service: Urology;  Laterality: Right;  ? INGUINAL HERNIA REPAIR Bilateral 2002 approx.  ? NEPHROLITHOTOMY Left 05/30/2015  ? Procedure: LEFT PERCUTANEOUS NEPHROLITHOTOMY;  Surgeon: Ihor Gully, MD;  Location: [facility];  Service: Urology;  Laterality: Left;  ? POLYPECTOMY    ? STONE EXTRACTION WITH BASKET Right 03/24/2015  ? Procedure: STONE EXTRACTION WITH BASKET;  Surgeon: Ihor Gully, MD;  Location: [facility];  Service: Urology;  Laterality: Right;  ? ? ?Family History  ?Problem Relation Age of Onset  ? Heart disease Mother   ? Hypertension Mother   ? Kidney disease Mother   ? Heart disease Father   ? Hypertension Father   ? Diabetes Sister   ? Diabetes Sister   ? Hypercalcemia Neg Hx   ? ? ?Social History ?Social History  ? ?Tobacco Use  ? Smoking status: Never  ? Smokeless tobacco: Never  ?Vaping Use  ? Vaping Use: Never used  ?Substance Use Topics  ? Alcohol use: No  ?  Alcohol/week: 0.0 standard drinks  ? Drug use: No  ? ? ?  Current Outpatient Medications  ?Medication Sig Dispense Refill  ? albuterol (VENTOLIN HFA) 108 (90 Base) MCG/ACT inhaler Inhale 2 puffs into the lungs every 6 (six) hours as needed for wheezing or shortness of breath. 1 each 2  ? amLODipine (NORVASC) 5 MG tablet TAKE 1 TABLET BY MOUTH AT BEDTIME 90 tablet 1  ? aspirin 81 MG EC tablet Take 81 mg by mouth every morning.    ? calcium carbonate (TUMS - DOSED IN MG ELEMENTAL CALCIUM) 500 MG chewable tablet Chew 1 tablet by mouth as needed for indigestion or heartburn. Reported on 11/19/2015    ? FLOVENT HFA 44 MCG/ACT inhaler INHALE 1 PUFF BY MOUTH INTO THE LUNGS TWICE DAILY (Patient taking  differently: Inhale 2 puffs into the lungs daily.) 21.2 g 2  ? losartan (COZAAR) 50 MG tablet TAKE 1 TABLET BY MOUTH DAILY 90 tablet 2  ? montelukast (SINGULAIR) 10 MG tablet TAKE 1 TABLET BY MOUTH EVERY NIGHT AT BEDTIME. GENERIC EQUIVALENT FOR SINGULAIR 90 tablet 1  ? Multiple Vitamin (MULTIVITAMIN) tablet Take 1 tablet by mouth every morning. Reported on 11/19/2015    ? polyvinyl alcohol (LIQUIFILM TEARS) 1.4 % ophthalmic solution Place 1 drop into both eyes daily as needed for dry eyes. Reported on 11/19/2015    ? rosuvastatin (CRESTOR) 10 MG tablet Take 1 tablet (10 mg total) by mouth daily. 90 tablet 3  ? Travoprost, BAK Free, (TRAVATAN) 0.004 % SOLN ophthalmic solution Place 1 drop into both eyes at bedtime.    ? ?Vital Signs:  ?Vitals:  ? 03/18/22 1358  ?BP: (!) 147/70  ?Pulse: 83  ?Resp: 20  ?SpO2: 97%  ?  ? ? ?Allergies  ?Allergen Reactions  ? Lisinopril Swelling  ? ? ?Review of Systems ?Cardiac Review of Systems: Y or  [  N  ]= no ?            Chest Pain [ Y  ]         Resting SOB [ N  ]      Exertional SOB  [Y  ]  ?            Pedal Edema [  N ]     Syncope  Klaus.Mock  ]  ?            General Review of Systems: [Y] = yes [  N]=no ?Constitional: nausea [ N ]; fever [  N];                                                            ?            Resp: cough [ N];  wheezing[ N ];  hemoptysis[  N];  ?GI: vomiting[ N ];  hematochezia [ N ];  ?GU: kidney stones [ Y];   ?            Musculosketetal:  joint pain[ Y ];  ?            Heme/Lymph:   anemia[ N ];  ?Neuro: TIA[ N ];   stroke[N  ];  ?            Psych:depression[ Y ];  ?            Endocrine: diabetes[ N ];   ? ?  Physical Exam: ?CV-RRR, ? soft murmur (systolic ejection murmur) ?Pulmonary-Clear to auscultation bilaterally ?Extremities- Varicose veins lower right leg. No LE edema ? ? ?Diagnostic Tests: ?Narrative & Impression  ?CLINICAL DATA:  Follow-up of aneurysmal dilatation of the ascending ?thoracic aorta. ?  ?EXAM: ?CT ANGIOGRAPHY CHEST WITH CONTRAST ?   ?TECHNIQUE: ?Multidetector CT imaging of the chest was performed using the ?standard protocol during bolus administration of intravenous ?contrast. Multiplanar CT image reconstructions and MIPs were ?obtained to evaluate the vascular anatomy. ?  ?RADIATION DOSE REDUCTION: This exam was performed according to the ?departmental dose-optimization program which includes automated ?exposure control, adjustment of the mA and/or kV according to ?patient size and/or use of iterative reconstruction technique. ?  ?CONTRAST:  100mL OMNIPAQUE IOHEXOL 350 MG/ML SOLN ?  ?COMPARISON:  CT of the chest with contrast on 01/15/2021. Other ?studies including CT of the chest on 04/05/2014. ?  ?FINDINGS: ?Cardiovascular: The aortic root is dilated and measures ?approximately 4.3-4.4 cm at the level of the sinuses of Valsalva. ?The ascending thoracic aorta is mildly dilated and measures ?approximately 4.1 cm in greatest diameter on today study. The ?proximal arch measures 3.6 cm and the distal arch 3.2 cm. The ?descending thoracic aorta measures 2.6 cm. Atherosclerosis present. ?No evidence of aortic dissection. Proximal great vessels demonstrate ?bovine branching anatomy. The trunk supplying the innominate and ?left common carotid arteries demonstrates dilatation measuring up to ?2.2 cm. Visualized proximal great vessels are tortuous and ?demonstrate no visible stenosis. ?  ?The heart size is at the upper limits of normal. Calcified coronary ?artery plaque present. No pericardial fluid identified. Central ?pulmonary arteries are normal in caliber. ?  ?Mediastinum/Nodes: No enlarged mediastinal, hilar, or axillary lymph ?nodes. Stable mild thyroid enlargement. Trachea and esophagus ?demonstrate no significant findings. ?  ?Lungs/Pleura: Stable multiple bilateral small pulmonary nodules. The ?largest measures 5.6 mm on image 67 in the lateral right lower lobe. ?Stable scattered pulmonary scarring. There is no evidence of ?pulmonary edema,  consolidation, pneumothorax or pleural fluid. ?  ?Upper Abdomen: Stable hepatic cysts. ?  ?Musculoskeletal: No chest wall abnormality. No acute or significant ?osseous findings. ?  ?Review of the MIP images co

## 2022-03-18 ENCOUNTER — Institutional Professional Consult (permissible substitution): Payer: Medicare Other | Admitting: Physician Assistant

## 2022-03-18 VITALS — BP 147/70 | HR 83 | Resp 20 | Ht 65.0 in | Wt 149.0 lb

## 2022-03-18 DIAGNOSIS — I7121 Aneurysm of the ascending aorta, without rupture: Secondary | ICD-10-CM

## 2022-03-18 NOTE — Patient Instructions (Addendum)
Risk Modification in those with ascending thoracic aortic aneurysm: ? ?Good control of blood pressure (prefer SBP 130/80 or less)-on Amlodipine 5 mg daily and Losartan 50 mg daily. He has an appointment in September for follow up for blood pressure ? ?2. Avoid fluoroquinolone antibiotics (I.e Ciprofloxacin, Avelox, Levofloxacin, Ofloxacin) ? ?3.  Use of statin (to decrease cardiovascular risk)-continue Crestor ? ?4.  Exercise and activity limitations is individualized, but in general, contact sports are to be avoided and one should avoid heavy lifting (defined as half of ideal body weight) and exercises involving sustained Valsalva maneuver. ? ?5. Counseling for those suspected of having genetically mediated disease. First-degree relatives of those with TAA disease should be screened as well as those who have a connective tissue disease (I.e with Marfan syndrome, Ehlers-Danlos syndrome, and Loeys-Dietz syndrome) or a  bicuspid aortic valve,have an increased risk for  ?complications related to TAA ? ?6. If one has tobacco abuse, smoking cessation is highly encouraged.  ? ?

## 2022-03-23 ENCOUNTER — Telehealth: Payer: Self-pay | Admitting: Pharmacist

## 2022-03-23 NOTE — Progress Notes (Signed)
Chronic Care Management Pharmacy Assistant   Name: [name]  MRN: [id number] DOB: [birth year]  Reason for Encounter: Disease State/ HTN   Conditions to be addressed/monitored: Rosuvastatin tolerance per MP  Recent office visits:  None   Recent consult visits:  03/18/22 Ardelle Balls, PA-C - Patient presented for Aneurysm of ascending aorta without rupture. No medication changes.  03/11/22 Patient presented to [facility] for Pre Admission Testing.  03/03/22 [facility], Roe Rutherford, PA (Cardiology) - Patient presented via Tele-health for Pre -Op visit. Advised to hold Asprin 5-7 days before procedure.  Hospital visits:  None in previous 6 months  Medications: Outpatient Encounter Medications as of 03/23/2022  Medication Sig   albuterol (VENTOLIN HFA) 108 (90 Base) MCG/ACT inhaler Inhale 2 puffs into the lungs every 6 (six) hours as needed for wheezing or shortness of breath.   amLODipine (NORVASC) 5 MG tablet TAKE 1 TABLET BY MOUTH AT BEDTIME   aspirin 81 MG EC tablet Take 81 mg by mouth every morning.   calcium carbonate (TUMS - DOSED IN MG ELEMENTAL CALCIUM) 500 MG chewable tablet Chew 1 tablet by mouth as needed for indigestion or heartburn. Reported on 11/19/2015   FLOVENT HFA 44 MCG/ACT inhaler INHALE 1 PUFF BY MOUTH INTO THE LUNGS TWICE DAILY (Patient taking differently: Inhale 2 puffs into the lungs daily.)   losartan (COZAAR) 50 MG tablet TAKE 1 TABLET BY MOUTH DAILY   montelukast (SINGULAIR) 10 MG tablet TAKE 1 TABLET BY MOUTH EVERY NIGHT AT BEDTIME. GENERIC EQUIVALENT FOR SINGULAIR   Multiple Vitamin (MULTIVITAMIN) tablet Take 1 tablet by mouth every morning. Reported on 11/19/2015   polyvinyl alcohol (LIQUIFILM TEARS) 1.4 % ophthalmic solution Place 1 drop into both eyes daily as needed for dry eyes. Reported on 11/19/2015   rosuvastatin (CRESTOR) 10 MG tablet Take 1 tablet (10 mg total) by mouth daily.   Travoprost, BAK Free, (TRAVATAN) 0.004 % SOLN  ophthalmic solution Place 1 drop into both eyes at bedtime.   No facility-administered encounter medications on file as of 03/23/2022.    Reviewed chart prior to disease state call. Spoke with patient regarding BP  Recent Office Vitals: BP Readings from Last 3 Encounters:  03/18/22 (!) 147/70  02/02/22 136/80  09/02/21 122/80   Pulse Readings from Last 3 Encounters:  03/18/22 83  02/02/22 85  09/02/21 69    Wt Readings from Last 3 Encounters:  03/18/22 149 lb (67.6 kg)  02/02/22 158 lb 2 oz (71.7 kg)  09/02/21 153 lb 3.2 oz (69.5 kg)     Kidney Function Lab Results  Component Value Date/Time   CREATININE 1.02 02/02/2022 08:42 AM   CREATININE 1.12 08/04/2021 11:26 AM   CREATININE 1.09 08/01/2020 08:58 AM   GFR 67.56 02/02/2022 08:42 AM   GFRNONAA 63 08/01/2020 08:58 AM   GFRAA 73 08/01/2020 08:58 AM       Latest Ref Rng & Units 02/02/2022    8:42 AM 08/04/2021   11:26 AM 12/30/2020   12:20 PM  BMP  Glucose 70 - 99 mg/dL 98   94   82    BUN 6 - 23 mg/dL 14   14   13     Creatinine 0.40 - 1.50 mg/dL   9.93   7.16    Sodium 135 - 145 mEq/L 140   142   141    Potassium 3.5 - 5.1 mEq/L 3.5   4.1   4.4    Chloride 96 - 112  mEq/L 104   105   104    CO2 19 - 32 mEq/L 28   31   31     Calcium 8.4 - 10.5 mg/dL   76.7   34.1      Current antihypertensive regimen:  Losartan 50 mg 1 tablet daily - Appropriate, Effective, Safe, Accessible Amlodipine 5 mg 1 tablet daily - Appropriate, Effective, Safe, Accessible How often are you checking your Blood Pressure? daily Current home BP readings:  135/77 p 62 124/68 p 75 139/71 p 59 128/74 p 58 What recent interventions/DTPs have been made by any provider to improve Blood Pressure control since last CPP Visit: Patient reports no changes Any recent hospitalizations or ED visits since last visit with CPP? No  Adherence Review: Is the patient currently on ACE/ARB medication? Yes Does the patient have >5 day gap between last  estimated fill dates? No  Notes: Per 93.7 Call to patient to see if he is experiencing any side effects since starting the Rosuvastatin back every other day. She advises if he is not experiencing any side effects and tolerating it well he may proceed with taking it every day. Patient reports he has been tolerating it well and taking every other day no issues will start daily.  Care Gaps: COVID Booster - Overdue Zoster Vaccine - Overdue BP- 136/80 ( 02/02/22) AWV- 4/23  Star Rating Drugs: Losartan (Cozaar) 50 mg - Last filled 02/09/22 90 DS at [facility] Rosuvastatin (Crestor) 10 mg - Last filled 02/24/22 90 DS at [facility]   04/26/22 CMA Clinical Pharmacist Assistant [phone]

## 2022-03-26 ENCOUNTER — Other Ambulatory Visit: Payer: Self-pay | Admitting: Family Medicine

## 2022-05-17 ENCOUNTER — Telehealth: Payer: Self-pay | Admitting: Pharmacist

## 2022-05-17 NOTE — Chronic Care Management (AMB) (Signed)
Chronic Care Management Pharmacy Assistant   Name: [name]  MRN: [id number] DOB: 04/11/1935  Reason for Encounter: Disease State   Conditions to be addressed/monitored: HTN  Recent office visits:  None  Recent consult visits:  None  Hospital visits:  None in previous 6 months  Medications: Outpatient Encounter Medications as of 05/17/2022  Medication Sig   albuterol (VENTOLIN HFA) 108 (90 Base) MCG/ACT inhaler Inhale 2 puffs into the lungs every 6 (six) hours as needed for wheezing or shortness of breath.   amLODipine (NORVASC) 5 MG tablet TAKE 1 TABLET BY MOUTH AT BEDTIME   aspirin 81 MG EC tablet Take 81 mg by mouth every morning.   calcium carbonate (TUMS - DOSED IN MG ELEMENTAL CALCIUM) 500 MG chewable tablet Chew 1 tablet by mouth as needed for indigestion or heartburn. Reported on 11/19/2015   FLOVENT HFA 44 MCG/ACT inhaler INHALE 1 PUFF BY MOUTH INTO THE LUNGS TWICE DAILY (Patient taking differently: Inhale 2 puffs into the lungs daily.)   losartan (COZAAR) 50 MG tablet TAKE 1 TABLET BY MOUTH DAILY   montelukast (SINGULAIR) 10 MG tablet TAKE 1 TABLET BY MOUTH EVERY NIGHT AT BEDTIME. GENERIC EQUIVALENT FOR SINGULAIR   Multiple Vitamin (MULTIVITAMIN) tablet Take 1 tablet by mouth every morning. Reported on 11/19/2015   polyvinyl alcohol (LIQUIFILM TEARS) 1.4 % ophthalmic solution Place 1 drop into both eyes daily as needed for dry eyes. Reported on 11/19/2015   rosuvastatin (CRESTOR) 10 MG tablet Take 1 tablet (10 mg total) by mouth daily.   Travoprost, BAK Free, (TRAVATAN) 0.004 % SOLN ophthalmic solution Place 1 drop into both eyes at bedtime.   No facility-administered encounter medications on file as of 05/17/2022.   Reviewed chart prior to disease state call. Spoke with patient regarding BP  Recent Office Vitals: BP Readings from Last 3 Encounters:  03/18/22 (!) 147/70  02/02/22 136/80  09/02/21 122/80   Pulse Readings from Last 3 Encounters:  03/18/22 83   02/02/22 85  09/02/21 69    Wt Readings from Last 3 Encounters:  03/18/22 149 lb (67.6 kg)  02/02/22 158 lb 2 oz (71.7 kg)  09/02/21 153 lb 3.2 oz (69.5 kg)     Kidney Function Lab Results  Component Value Date/Time   CREATININE 1.02 02/02/2022 08:42 AM   CREATININE 1.12 08/04/2021 11:26 AM   CREATININE 1.09 08/01/2020 08:58 AM   GFR 67.56 02/02/2022 08:42 AM   GFRNONAA 63 08/01/2020 08:58 AM   GFRAA 73 08/01/2020 08:58 AM       Latest Ref Rng & Units 02/02/2022    8:42 AM 08/04/2021   11:26 AM 12/30/2020   12:20 PM  BMP  Glucose 70 - 99 mg/dL 98  94  82   BUN 6 - 23 mg/dL 14  14  13    Creatinine 0.40 - 1.50 mg/dL  7.89  3.81   Sodium 135 - 145 mEq/L 140  142  141   Potassium 3.5 - 5.1 mEq/L 3.5  4.1  4.4   Chloride 96 - 112 mEq/L 104  105  104   CO2 19 - 32 mEq/L 28  31  31    Calcium 8.4 - 10.5 mg/dL 0.17   51.0     Current antihypertensive regimen:  Losartan 50 mg 1 tablet daily - Appropriate, Effective, Safe, Accessible Amlodipine 5 mg 1 tablet daily - Appropriate, Effective, Safe, Accessible How often are you checking your Blood Pressure? daily Current home BP readings: 119/82  on today on the phone he reports his bottom number has been in the 70's usually had been walking around prior to me calling. He denies any hyper/hypotensive symptoms What recent interventions/DTPs have been made by any provider to improve Blood Pressure control since last CPP Visit: Patient reports none and expresses compliance in the use of the above hypertension medications. Any recent hospitalizations or ED visits since last visit with CPP? No   Adherence Review: Is the patient currently on ACE/ARB medication? Yes Does the patient have >5 day gap between last estimated fill dates? No   Care Gaps: COVID Booster - Overdue Zoster Vaccine - Postponed CCM- 10/23 AWV- 9/22 BP-  136/80 02/02/22  Star Rating Drugs: Losartan (Cozaar) 50 mg - Last filled 05/11/22 90 DS at [facility] Rosuvastatin (Crestor) 10 mg - Last filled 02/24/22 90 DS at [facility]   Pamala Duffel CMA Clinical Pharmacist Assistant [phone]

## 2022-05-24 ENCOUNTER — Telehealth: Payer: Self-pay

## 2022-05-24 DIAGNOSIS — I10 Essential (primary) hypertension: Secondary | ICD-10-CM

## 2022-05-24 NOTE — Telephone Encounter (Signed)
-----   Message from Kathreen Devoid, CMA sent at 05/19/2022  7:30 AM EDT ----- Regarding: FW: CCM referral  ----- Message ----- From: Verner Chol, [facility] Sent: 05/17/2022   5:02 PM EDT To: Kathreen Devoid, CMA Subject: CCM referral                                   Hi,  When you get the chance, can you please put in a CCM referral for Mr. Banks?  Thanks! Maddie

## 2022-05-25 ENCOUNTER — Other Ambulatory Visit: Payer: Self-pay | Admitting: Family Medicine

## 2022-06-01 DIAGNOSIS — H4010X1 Unspecified open-angle glaucoma, mild stage: Secondary | ICD-10-CM | POA: Diagnosis not present

## 2022-06-01 DIAGNOSIS — H401111 Primary open-angle glaucoma, right eye, mild stage: Secondary | ICD-10-CM | POA: Diagnosis not present

## 2022-07-29 ENCOUNTER — Telehealth: Payer: Self-pay | Admitting: Family Medicine

## 2022-07-29 NOTE — Telephone Encounter (Signed)
Left message for patient to call back and schedule Medicare Annual Wellness Visit (AWV) either virtually or in office. Left  my jabber number [phone]   Last AWV ;06/03/21  please schedule at anytime with [facility] Nurse Health Advisor 1 or 2    

## 2022-08-01 ENCOUNTER — Other Ambulatory Visit: Payer: Self-pay

## 2022-08-01 ENCOUNTER — Encounter (HOSPITAL_COMMUNITY): Payer: Self-pay

## 2022-08-01 ENCOUNTER — Emergency Department (HOSPITAL_COMMUNITY)
Admission: EM | Admit: 2022-08-01 | Discharge: 2022-08-02 | Disposition: A | Discharge status: Home / Self Care | Payer: Medicare Other | Attending: Emergency Medicine | Admitting: Emergency Medicine

## 2022-08-01 DIAGNOSIS — N201 Calculus of ureter: Secondary | ICD-10-CM

## 2022-08-01 DIAGNOSIS — Z79899 Other long term (current) drug therapy: Secondary | ICD-10-CM | POA: Diagnosis not present

## 2022-08-01 DIAGNOSIS — E876 Hypokalemia: Secondary | ICD-10-CM | POA: Diagnosis not present

## 2022-08-01 DIAGNOSIS — Z7951 Long term (current) use of inhaled steroids: Secondary | ICD-10-CM | POA: Insufficient documentation

## 2022-08-01 DIAGNOSIS — Z7982 Long term (current) use of aspirin: Secondary | ICD-10-CM | POA: Insufficient documentation

## 2022-08-01 DIAGNOSIS — N200 Calculus of kidney: Secondary | ICD-10-CM | POA: Diagnosis not present

## 2022-08-01 DIAGNOSIS — I10 Essential (primary) hypertension: Secondary | ICD-10-CM | POA: Diagnosis not present

## 2022-08-01 DIAGNOSIS — J45909 Unspecified asthma, uncomplicated: Secondary | ICD-10-CM | POA: Insufficient documentation

## 2022-08-01 DIAGNOSIS — N139 Obstructive and reflux uropathy, unspecified: Secondary | ICD-10-CM | POA: Diagnosis not present

## 2022-08-01 DIAGNOSIS — K7689 Other specified diseases of liver: Secondary | ICD-10-CM | POA: Diagnosis not present

## 2022-08-01 DIAGNOSIS — N132 Hydronephrosis with renal and ureteral calculous obstruction: Secondary | ICD-10-CM | POA: Diagnosis not present

## 2022-08-01 DIAGNOSIS — N23 Unspecified renal colic: Secondary | ICD-10-CM | POA: Diagnosis not present

## 2022-08-01 DIAGNOSIS — R1030 Lower abdominal pain, unspecified: Secondary | ICD-10-CM | POA: Diagnosis present

## 2022-08-01 DIAGNOSIS — N1330 Unspecified hydronephrosis: Secondary | ICD-10-CM | POA: Diagnosis not present

## 2022-08-01 LAB — CBC WITH DIFFERENTIAL/PLATELET
Abs Immature Granulocytes: 0.05 10*3/uL (ref 0.00–0.07)
Basophils Absolute: 0 10*3/uL (ref 0.0–0.1)
Basophils Relative: 0 %
Eosinophils Absolute: 0 10*3/uL (ref 0.0–0.5)
Eosinophils Relative: 0 %
HCT: 44.4 % (ref 39.0–52.0)
Hemoglobin: 14.7 g/dL (ref 13.0–17.0)
Immature Granulocytes: 0 %
Lymphocytes Relative: 11 %
Lymphs Abs: 1.3 10*3/uL (ref 0.7–4.0)
MCH: 30.2 pg (ref 26.0–34.0)
MCHC: 33.1 g/dL (ref 30.0–36.0)
MCV: 91.4 fL (ref 80.0–100.0)
Monocytes Absolute: 0.3 10*3/uL (ref 0.1–1.0)
Monocytes Relative: 3 %
Neutro Abs: 9.6 10*3/uL — ABNORMAL HIGH (ref 1.7–7.7)
Neutrophils Relative %: 86 %
Platelets: 204 10*3/uL (ref 150–400)
RBC: 4.86 MIL/uL (ref 4.22–5.81)
RDW: 14.3 % (ref 11.5–15.5)
WBC: 11.3 10*3/uL — ABNORMAL HIGH (ref 4.0–10.5)
nRBC: 0 % (ref 0.0–0.2)

## 2022-08-01 MED ORDER — ONDANSETRON HCL 4 MG/2ML IJ SOLN
4.0000 mg | Freq: Once | INTRAMUSCULAR | Status: AC
  Administered 2022-08-01: 4 mg via INTRAVENOUS
  Filled 2022-08-01: qty 2

## 2022-08-01 MED ORDER — MORPHINE SULFATE (PF) 4 MG/ML IV SOLN
4.0000 mg | Freq: Once | INTRAVENOUS | Status: AC
  Administered 2022-08-01: 4 mg via INTRAVENOUS
  Filled 2022-08-01: qty 1

## 2022-08-01 NOTE — ED Triage Notes (Signed)
Ambulatory to ED with lower abd pain and vomiting for 3 hours PTA. Hx of kidney stones, states this feels similar.

## 2022-08-01 NOTE — ED Provider Notes (Signed)
[facility]-EMERGENCY DEPT Provider Note: Lowella Dell, MD, FACEP  CSN: [id number] MRN: [id number] ARRIVAL: 08/01/22 at 2257 ROOM: [facility]07/[facility]07   CHIEF COMPLAINT  Abdominal Pain   HISTORY OF PRESENT ILLNESS  08/01/22 11:25 PM KASHIUS DOMINIC is a 87 y.o. male with a history of kidney stones.  He is here with lower abdominal pain for the past 3 hours that has been associated with nausea and vomiting.  He rates the pain as a 7 out of 10.  He describes the pain as feeling like previous kidney stones.  The pain is not worse with palpation or movement.   Past Medical History:  Diagnosis Date   Allergy    SEASONAL   Arthritis    Asthma    BPH (benign prostatic hypertrophy)    GERD (gastroesophageal reflux disease)    History of kidney stones    History of myasthenia gravis    15 yrs ago - no current problem or treatment   Hypertension    Mild carotid artery disease (HCC)    bilateral ICA 0-39%  per duplex 02-16-2013   Multiple pulmonary nodules    per last CT stable   Nephrolithiasis    BILATERAL    Past Surgical History:  Procedure Laterality Date   CATARACT EXTRACTION W/ INTRAOCULAR LENS  IMPLANT, BILATERAL  2007   COLONOSCOPY     COLONOSCOPY W/ POLYPECTOMY  05-14-2010   CYSTO/  LEFT RETROGRADE PYELOGRAM/  LEFT URETEROSCOPY/  LEFT STENT PLACEMENT  06-17-2008   CYSTOSCOPY W/ RETROGRADES Bilateral 03/24/2015   Procedure: CYSTOSCOPY WITH RETROGRADE PYELOGRAM;  Surgeon: Ihor Gully, MD;  Location: [facility];  Service: Urology;  Laterality: Bilateral;   CYSTOSCOPY WITH STENT PLACEMENT Right 03/24/2015   Procedure: CYSTOSCOPY WITH STENT PLACEMENT;  Surgeon: Ihor Gully, MD;  Location: [facility];  Service: Urology;  Laterality: Right;   CYSTOSCOPY WITH URETEROSCOPY Right 03/24/2015   Procedure: CYSTOSCOPY WITH URETEROSCOPY;  Surgeon: Ihor Gully, MD;  Location: [facility];  Service: Urology;  Laterality: Right;   EXTRACORPOREAL SHOCK  WAVE LITHOTRIPSY Bilateral left 02-23-2010 //   right 09-28-2010   HOLMIUM LASER APPLICATION Right 03/24/2015   Procedure: HOLMIUM LASER APPLICATION;  Surgeon: Ihor Gully, MD;  Location: [facility];  Service: Urology;  Laterality: Right;   INGUINAL HERNIA REPAIR Bilateral 2002 approx.   NEPHROLITHOTOMY Left 05/30/2015   Procedure: LEFT PERCUTANEOUS NEPHROLITHOTOMY;  Surgeon: Ihor Gully, MD;  Location: [facility];  Service: Urology;  Laterality: Left;   POLYPECTOMY     STONE EXTRACTION WITH BASKET Right 03/24/2015   Procedure: STONE EXTRACTION WITH BASKET;  Surgeon: Ihor Gully, MD;  Location: [facility];  Service: Urology;  Laterality: Right;    Family History  Problem Relation Age of Onset   Heart disease Mother    Hypertension Mother    Kidney disease Mother    Heart disease Father    Hypertension Father    Diabetes Sister    Diabetes Sister    Hypercalcemia Neg Hx     Social History   Tobacco Use   Smoking status: Never   Smokeless tobacco: Never  Vaping Use   Vaping Use: Never used  Substance Use Topics   Alcohol use: No    Alcohol/week: 0.0 standard drinks of alcohol   Drug use: No    Prior to Admission medications   Medication Sig Start Date End Date Taking? Authorizing Provider  ondansetron (ZOFRAN-ODT) 8 MG disintegrating tablet Take 1 tablet (  8 mg total) by mouth every 8 (eight) hours as needed. 08/02/22  Yes Scottie Metayer, MD  oxyCODONE-acetaminophen (PERCOCET) 5-325 MG tablet Take 1-2 tablets by mouth every 4 (four) hours as needed for severe pain (May cause constipation). 08/02/22  Yes Zeb Rawl, MD  potassium chloride SA (KLOR-CON M) 20 MEQ tablet Take 1 tablet (20 mEq total) by mouth daily for 5 days. 08/02/22 08/07/22 Yes Kourtnei Rauber, MD  albuterol (VENTOLIN HFA) 108 (90 Base) MCG/ACT inhaler Inhale 2 puffs into the lungs every 6 (six) hours as needed for wheezing or shortness of breath. 04/29/21   [place], [name], MD  amLODipine  (NORVASC) 5 MG tablet TAKE 1 TABLET BY MOUTH AT BEDTIME 03/08/22   [place], [name], MD  aspirin 81 MG EC tablet Take 81 mg by mouth every morning.    [provider]  calcium carbonate (TUMS - DOSED IN MG ELEMENTAL CALCIUM) 500 MG chewable tablet Chew 1 tablet by mouth as needed for indigestion or heartburn. Reported on 11/19/2015    [provider]  FLOVENT HFA 44 MCG/ACT inhaler INHALE 1 PUFF BY MOUTH INTO THE LUNGS TWICE DAILY Patient taking differently: Inhale 2 puffs into the lungs daily. 01/23/21   [place], [name], MD  losartan (COZAAR) 50 MG tablet TAKE 1 TABLET BY MOUTH DAILY 02/09/22   [place], [name], MD  montelukast (SINGULAIR) 10 MG tablet TAKE 1 TABLET BY MOUTH EVERY NIGHT AT BEDTIME. GENERIC EQUIVALENT FOR SINGULAIR 03/26/22   [place], [name], MD  Multiple Vitamin (MULTIVITAMIN) tablet Take 1 tablet by mouth every morning. Reported on 11/19/2015    [provider]  polyvinyl alcohol (LIQUIFILM TEARS) 1.4 % ophthalmic solution Place 1 drop into both eyes daily as needed for dry eyes. Reported on 11/19/2015    [provider]  rosuvastatin (CRESTOR) 10 MG tablet TAKE 1 TABLET BY MOUTH DAILY 05/26/22   [place], [name], MD  Travoprost, BAK Free, (TRAVATAN) 0.004 % SOLN ophthalmic solution Place 1 drop into both eyes at bedtime. 01/13/22   [provider]    Allergies Lisinopril   REVIEW OF SYSTEMS  Negative except as noted here or in the History of Present Illness.   PHYSICAL EXAMINATION  Initial Vital Signs Blood pressure (!) 188/89, pulse 69, temperature 97.8 F (36.6 C), temperature source Oral, resp. rate 18, height 5\' 5"  (1.651 m), weight 67.1 kg, SpO2 100 %.  Examination General: Well-developed, well-nourished male in no acute distress; appearance consistent with age of record HENT: normocephalic; atraumatic Eyes: Pupils pinpoint; extraocular muscles grossly intact; arcus senilis bilaterally Neck: supple Heart: regular rate and  rhythm Lungs: clear to auscultation bilaterally Abdomen: soft; nondistended; nontender; bowel sounds present Extremities: No deformity; full range of motion; pulses normal Neurologic: Awake, alert and oriented; motor function intact in all extremities and symmetric; no facial droop Skin: Warm and dry Psychiatric: Normal mood and affect   RESULTS  Summary of this visit's results, reviewed and interpreted by myself:   EKG Interpretation  Date/Time:  Sunday August 01 2022 23:18:09 EDT Ventricular Rate:  67 PR Interval:  267 QRS Duration: 93 QT Interval:  417 QTC Calculation: 441 R Axis:   -39 Text Interpretation: Sinus or ectopic atrial rhythm Prolonged PR interval Probable left ventricular hypertrophy U waves present Confirmed by Desaree Downen ([id number]) on 08/01/2022 11:26:59 PM       Laboratory Studies: Results for orders placed or performed during the hospital encounter of 08/01/22 (from the past 24 hour(s))  Basic metabolic panel  Status: Abnormal   Collection Time: 08/01/22 11:28 PM  Result Value Ref Range   Sodium 142 135 - 145 mmol/L   Potassium 2.9 (L) 3.5 - 5.1 mmol/L   Chloride 107 98 - 111 mmol/L   CO2 25 22 - 32 mmol/L   Glucose, Bld 138 (H) 70 - 99 mg/dL   BUN 13 8 - 23 mg/dL   Creatinine, Ser 1.09 0.61 - 1.24 mg/dL   Calcium 10.5 (H) 8.9 - 10.3 mg/dL   GFR, Estimated >60 >60 mL/min   Anion gap 10 5 - 15  CBC with Differential     Status: Abnormal   Collection Time: 08/01/22 11:28 PM  Result Value Ref Range   WBC 11.3 (H) 4.0 - 10.5 K/uL   RBC 4.86 4.22 - 5.81 MIL/uL   Hemoglobin 14.7 13.0 - 17.0 g/dL   HCT 44.4 39.0 - 52.0 %   MCV 91.4 80.0 - 100.0 fL   MCH 30.2 26.0 - 34.0 pg   MCHC 33.1 30.0 - 36.0 g/dL   RDW 14.3 11.5 - 15.5 %   Platelets 204 150 - 400 K/uL   nRBC 0.0 0.0 - 0.2 %   Neutrophils Relative % 86 %   Neutro Abs 9.6 (H) 1.7 - 7.7 K/uL   Lymphocytes Relative 11 %   Lymphs Abs 1.3 0.7 - 4.0 K/uL   Monocytes Relative 3 %   Monocytes  Absolute 0.3 0.1 - 1.0 K/uL   Eosinophils Relative 0 %   Eosinophils Absolute 0.0 0.0 - 0.5 K/uL   Basophils Relative 0 %   Basophils Absolute 0.0 0.0 - 0.1 K/uL   Immature Granulocytes 0 %   Abs Immature Granulocytes 0.05 0.00 - 0.07 K/uL  Urinalysis, Routine w reflex microscopic     Status: Abnormal   Collection Time: 08/01/22 11:48 PM  Result Value Ref Range   Color, Urine COLORLESS (A) YELLOW   APPearance CLEAR CLEAR   Specific Gravity, Urine 1.005 1.005 - 1.030   pH 8.0 5.0 - 8.0   Glucose, UA NEGATIVE NEGATIVE mg/dL   Hgb urine dipstick MODERATE (A) NEGATIVE   Bilirubin Urine NEGATIVE NEGATIVE   Ketones, ur NEGATIVE NEGATIVE mg/dL   Protein, ur NEGATIVE NEGATIVE mg/dL   Nitrite NEGATIVE NEGATIVE   Leukocytes,Ua NEGATIVE NEGATIVE   RBC / HPF 11-20 0 - 5 RBC/hpf   WBC, UA 0-5 0 - 5 WBC/hpf   Bacteria, UA RARE (A) NONE SEEN   Squamous Epithelial / LPF 6-10 0 - 5   Mucus PRESENT    Imaging Studies: CT Renal Stone Study  Result Date: 08/02/2022 CLINICAL DATA:  Lower abdominal pain and vomiting for several hours, initial encounter EXAM: CT ABDOMEN AND PELVIS WITHOUT CONTRAST TECHNIQUE: Multidetector CT imaging of the abdomen and pelvis was performed following the standard protocol without IV contrast. RADIATION DOSE REDUCTION: This exam was performed according to the departmental dose-optimization program which includes automated exposure control, adjustment of the mA and/or kV according to patient size and/or use of iterative reconstruction technique. COMPARISON:  01/18/2022 FINDINGS: Lower chest: Scattered small less than 5 mm nodules are noted in the lung bases stable in appearance from the prior exam. Hepatobiliary: Gallbladder demonstrates dependent cholelithiasis without complicating factors. Liver again demonstrates cystic lesions similar to that noted on the prior exam. Pancreas: Unremarkable. No pancreatic ductal dilatation or surrounding inflammatory changes. Spleen: Normal in  size without focal abnormality. Adrenals/Urinary Tract: Adrenal glands are within normal limits. Multiple renal calculi are noted on the left. The  largest of these measures up to 19 mm within renal pelvis. This is stable appearance from exam. Multiple smaller stones are noted. No obstructive changes are seen. Right kidney demonstrates multiple nonobstructing calculi. Fullness of the right collecting system and ureter is noted which extends to the level of the urinary bladder. 6 mm stone is noted at the right UVJ causing the obstructive change. Multiple bladder calculi are noted. Stomach/Bowel: Appendix is not well visualized and may have been surgically removed. No inflammatory changes are noted. No obstructive or inflammatory change of the colon is seen. Small bowel and stomach appear within normal limits. Vascular/Lymphatic: Aortic atherosclerosis. No enlarged abdominal or pelvic lymph nodes. Reproductive: Prostate is enlarged indenting upon the inferior aspect of the bladder. Other: No abdominal wall hernia or abnormality. No abdominopelvic ascites. Musculoskeletal: Degenerative changes lumbar spine are noted. IMPRESSION: Interval migration of a 6 mm stone to the right UVJ with significant hydronephrosis. Bilateral nonobstructing renal stones are again noted similar to that seen on prior exam. Cholelithiasis without complicating factors. Multiple bladder calculi are again identified and stable. Multiple less than 5 mm nodules in the lung bases. These are stable from a prior exam from 2022 and felt to be benign in etiology. No further follow-up is recommended. Electronically Signed   By: Alcide Clever M.D.   On: 08/02/2022 01:01    ED COURSE and MDM  Nursing notes, initial and subsequent vitals signs, including pulse oximetry, reviewed and interpreted by myself.  Vitals:   08/01/22 2330 08/01/22 2345 08/01/22 2346 08/02/22 0000  BP: (!) 197/94 (!) 203/91  (!) 136/106  Pulse:   72 73  Resp: 14  14 (!) 25   Temp:      TempSrc:      SpO2:   100% 98%  Weight:      Height:       Medications  potassium chloride 10 mEq in 100 mL IVPB (10 mEq Intravenous New Bag/Given 08/02/22 0033)  oxyCODONE-acetaminophen (PERCOCET/ROXICET) 5-325 MG per tablet 2 tablet (has no administration in time range)  ondansetron (ZOFRAN) injection 4 mg (4 mg Intravenous Given 08/01/22 2341)  morphine (PF) 4 MG/ML injection 4 mg (4 mg Intravenous Given 08/01/22 2342)  potassium chloride SA (KLOR-CON M) CR tablet 40 mEq (40 mEq Oral Given 08/02/22 0032)   12:17 AM Suspected based on U waves seen on EKG the patient's potassium is low.  We will initiate repletion.  1:08 AM Inadequately controlled at this time.  Patient advised of CT findings.  He is a patient of Dr. Cardell Peach of urology and we will have him follow-up.  Due to the size of the stone he may need intervention.  Message sent to Dr. Cardell Peach.   PROCEDURES  Procedures   ED DIAGNOSES     ICD-10-CM   1. Ureterolithiasis  N20.1     2. Hypokalemia  E87.6          Erandi Lemma, Jonny Ruiz, MD 08/02/22 [phone]

## 2022-08-02 ENCOUNTER — Emergency Department (HOSPITAL_COMMUNITY): Payer: Medicare Other

## 2022-08-02 DIAGNOSIS — K7689 Other specified diseases of liver: Secondary | ICD-10-CM | POA: Diagnosis not present

## 2022-08-02 DIAGNOSIS — N200 Calculus of kidney: Secondary | ICD-10-CM | POA: Diagnosis not present

## 2022-08-02 DIAGNOSIS — N1330 Unspecified hydronephrosis: Secondary | ICD-10-CM | POA: Diagnosis not present

## 2022-08-02 DIAGNOSIS — N139 Obstructive and reflux uropathy, unspecified: Secondary | ICD-10-CM | POA: Diagnosis not present

## 2022-08-02 LAB — URINALYSIS, ROUTINE W REFLEX MICROSCOPIC
Bilirubin Urine: NEGATIVE
Glucose, UA: NEGATIVE mg/dL
Ketones, ur: NEGATIVE mg/dL
Leukocytes,Ua: NEGATIVE
Nitrite: NEGATIVE
Protein, ur: NEGATIVE mg/dL
Specific Gravity, Urine: 1.005 (ref 1.005–1.030)
pH: 8 (ref 5.0–8.0)

## 2022-08-02 LAB — BASIC METABOLIC PANEL
Anion gap: 10 (ref 5–15)
BUN: 13 mg/dL (ref 8–23)
CO2: 25 mmol/L (ref 22–32)
Calcium: 10.5 mg/dL — ABNORMAL HIGH (ref 8.9–10.3)
Chloride: 107 mmol/L (ref 98–111)
Creatinine, Ser: 1.09 mg/dL (ref 0.61–1.24)
GFR, Estimated: >60 mL/min (ref >60)
Glucose, Bld: 138 mg/dL — ABNORMAL HIGH (ref 70–99)
Potassium: 2.9 mmol/L — ABNORMAL LOW (ref 3.5–5.1)
Sodium: 142 mmol/L (ref 135–145)

## 2022-08-02 MED ORDER — POTASSIUM CHLORIDE CRYS ER 20 MEQ PO TBCR
40.0000 meq | EXTENDED_RELEASE_TABLET | Freq: Once | ORAL | Status: AC
  Administered 2022-08-02: 40 meq via ORAL
  Filled 2022-08-02: qty 2

## 2022-08-02 MED ORDER — OXYCODONE-ACETAMINOPHEN 5-325 MG PO TABS
2.0000 | ORAL_TABLET | Freq: Once | ORAL | Status: AC
  Administered 2022-08-02: 2 via ORAL
  Filled 2022-08-02 (×2): qty 2

## 2022-08-02 MED ORDER — ONDANSETRON 8 MG PO TBDP: 8.0000 mg | ORAL_TABLET | Freq: Three times a day (TID) | ORAL | 1 refills | Status: DC | PRN

## 2022-08-02 MED ORDER — OXYCODONE-ACETAMINOPHEN 5-325 MG PO TABS: 1.0000 | ORAL_TABLET | ORAL | 0 refills | Status: DC | PRN

## 2022-08-02 MED ORDER — POTASSIUM CHLORIDE CRYS ER 20 MEQ PO TBCR: 20.0000 meq | EXTENDED_RELEASE_TABLET | Freq: Every day | ORAL | 0 refills | Status: DC

## 2022-08-02 MED ORDER — POTASSIUM CHLORIDE 10 MEQ/100ML IV SOLN
10.0000 meq | Freq: Once | INTRAVENOUS | Status: AC
  Administered 2022-08-02: 10 meq via INTRAVENOUS
  Filled 2022-08-02: qty 100

## 2022-08-03 DIAGNOSIS — N200 Calculus of kidney: Secondary | ICD-10-CM | POA: Diagnosis not present

## 2022-08-03 DIAGNOSIS — N320 Bladder-neck obstruction: Secondary | ICD-10-CM | POA: Diagnosis not present

## 2022-08-03 DIAGNOSIS — R3982 Chronic bladder pain: Secondary | ICD-10-CM | POA: Diagnosis not present

## 2022-08-03 DIAGNOSIS — N132 Hydronephrosis with renal and ureteral calculous obstruction: Secondary | ICD-10-CM | POA: Diagnosis not present

## 2022-08-03 DIAGNOSIS — K8020 Calculus of gallbladder without cholecystitis without obstruction: Secondary | ICD-10-CM | POA: Diagnosis not present

## 2022-08-03 DIAGNOSIS — N202 Calculus of kidney with calculus of ureter: Secondary | ICD-10-CM | POA: Diagnosis not present

## 2022-08-03 DIAGNOSIS — N400 Enlarged prostate without lower urinary tract symptoms: Secondary | ICD-10-CM | POA: Diagnosis not present

## 2022-08-05 NOTE — Progress Notes (Unsigned)
[name] is a 87 y.o.male, who is here today to follow on HTN.  Last follow up visit: 02/02/22 Hypertension:  Medications: Amlodipine 5 mg daily, losartan 50 mg daily. BP readings at home:130's/50's. Side effects:None. Negative for unusual or severe headache, visual changes, exertional chest pain, dyspnea,  focal weakness, or edema. + Thoracic aortic aneurysm, he established with cardiothoracic surgeon, 03/18/22.  Chest CT 02/16/22: Dilated aortic root measuring 4.3-4.4 cm and mildly dilated ascending thoracic aorta measuring 4.1 cm. Ascending thoracic aortic measurement is slightly smaller on the current study compared to a prior measurement of 4.3 cm.   Lab Results  Component Value Date   CREATININE 1.09 08/01/2022   BUN 13 08/01/2022   NA 142 08/01/2022   K 2.9 (L) 08/01/2022   CL 107 08/01/2022   CO2 25 08/01/2022   His urologist recommended K+ supplementation, renal a few days ago and resolved. Kidney stone episode 08/01/22, he had a few episodes of vomiting, no diarrhea. His diet back to normal. He started KLOR 20 meq yesterday.  Review of Systems  Constitutional:  Negative for activity change, appetite change and fever.  HENT:  Negative for nosebleeds and sore throat.   Respiratory:  Negative for cough and wheezing.   Gastrointestinal:  Negative for abdominal pain, nausea and vomiting.  Endocrine: Negative for cold intolerance and heat intolerance.  Genitourinary:  Negative for decreased urine volume, dysuria and hematuria.  Neurological:  Negative for dizziness, syncope and facial asymmetry.  Rest see pertinent positives and negatives per HPI.  Current Outpatient Medications on File Prior to Visit  Medication Sig Dispense Refill   albuterol (VENTOLIN HFA) 108 (90 Base) MCG/ACT inhaler Inhale 2 puffs into the lungs every 6 (six) hours as needed for wheezing or shortness of breath. 1 each 2   amLODipine (NORVASC) 5 MG tablet TAKE 1 TABLET BY MOUTH AT BEDTIME  90 tablet 1   aspirin 81 MG EC tablet Take 81 mg by mouth every morning.     calcium carbonate (TUMS - DOSED IN MG ELEMENTAL CALCIUM) 500 MG chewable tablet Chew 1 tablet by mouth as needed for indigestion or heartburn. Reported on 11/19/2015     FLOVENT HFA 44 MCG/ACT inhaler INHALE 1 PUFF BY MOUTH INTO THE LUNGS TWICE DAILY (Patient taking differently: Inhale 2 puffs into the lungs daily.) 21.2 g 2   losartan (COZAAR) 50 MG tablet TAKE 1 TABLET BY MOUTH DAILY 90 tablet 2   montelukast (SINGULAIR) 10 MG tablet TAKE 1 TABLET BY MOUTH EVERY NIGHT AT BEDTIME. GENERIC EQUIVALENT FOR SINGULAIR 90 tablet 1   Multiple Vitamin (MULTIVITAMIN) tablet Take 1 tablet by mouth every morning. Reported on 11/19/2015     ondansetron (ZOFRAN-ODT) 8 MG disintegrating tablet Take 1 tablet (8 mg total) by mouth every 8 (eight) hours as needed. 10 tablet 1   oxyCODONE-acetaminophen (PERCOCET) 5-325 MG tablet Take 1-2 tablets by mouth every 4 (four) hours as needed for severe pain (May cause constipation). 30 tablet 0   polyvinyl alcohol (LIQUIFILM TEARS) 1.4 % ophthalmic solution Place 1 drop into both eyes daily as needed for dry eyes. Reported on 11/19/2015     potassium chloride SA (KLOR-CON M) 20 MEQ tablet Take 1 tablet (20 mEq total) by mouth daily for 5 days. 5 tablet 0   rosuvastatin (CRESTOR) 10 MG tablet TAKE 1 TABLET BY MOUTH DAILY 90 tablet 3   Travoprost, BAK Free, (TRAVATAN) 0.004 % SOLN ophthalmic solution Place 1 drop into both eyes at  bedtime.     No current facility-administered medications on file prior to visit.   Past Medical History:  Diagnosis Date   Allergy    SEASONAL   Arthritis    Asthma    BPH (benign prostatic hypertrophy)    GERD (gastroesophageal reflux disease)    History of kidney stones    History of myasthenia gravis    15 yrs ago - no current problem or treatment   Hypertension    Mild carotid artery disease (HCC)    bilateral ICA 0-39%  per duplex 02-16-2013   Multiple  pulmonary nodules    per last CT stable   Nephrolithiasis    BILATERAL    Allergies  Allergen Reactions   Lisinopril Swelling    Social History   Socioeconomic History   Marital status: Married    Spouse name: Not on file   Number of children: Not on file   Years of education: Not on file   Highest education level: Some college, no degree  Occupational History   Not on file  Tobacco Use   Smoking status: Never   Smokeless tobacco: Never  Vaping Use   Vaping Use: Never used  Substance and Sexual Activity   Alcohol use: No    Alcohol/week: 0.0 standard drinks of alcohol   Drug use: No   Sexual activity: Not on file  Other Topics Concern   Not on file  Social History Narrative   Not on file   Social Determinants of Health   Financial Resource Strain: Low Risk  (08/02/2022)   Overall Financial Resource Strain (CARDIA)    Difficulty of Paying Living Expenses: Not hard at all  Food Insecurity: No Food Insecurity (08/02/2022)   Hunger Vital Sign    Worried About Running Out of Food in the Last Year: Never true    Ran Out of Food in the Last Year: Never true  Transportation Needs: No Transportation Needs (08/02/2022)   PRAPARE - Administrator, Civil Service (Medical): No    Lack of Transportation (Non-Medical): No  Physical Activity: Inactive (08/04/2021)   Exercise Vital Sign    Days of Exercise per Week: 0 days    Minutes of Exercise per Session: 0 min  Stress: No Stress Concern Present (08/02/2022)   [employer] of Occupational Health - Occupational Stress Questionnaire    Feeling of Stress : Not at all  Social Connections: Unknown (08/02/2022)   Social Connection and Isolation Panel [NHANES]    Frequency of Communication with Friends and Family: Not on file    Frequency of Social Gatherings with Friends and Family: Not on file    Attends Religious Services: Not on file    Active Member of Clubs or Organizations: No    Attends Banker  Meetings: Not on file    Marital Status: Married   Vitals:   08/06/22 0837  BP: 132/68  Pulse: 62  Resp: 18  SpO2: 96%  Body mass index is 24.82 kg/m. Physical Exam Vitals and nursing note reviewed.  Constitutional:      General: He is not in acute distress.    Appearance: He is well-developed and well-groomed.  HENT:     Head: Normocephalic and atraumatic.     Mouth/Throat:     Mouth: Mucous membranes are moist.     Dentition: Has dentures.     Pharynx: Oropharynx is clear.  Eyes:     Conjunctiva/sclera: Conjunctivae normal.  Cardiovascular:  Rate and Rhythm: Normal rate and regular rhythm.     Pulses:          Posterior tibial pulses are 2+ on the right side and 2+ on the left side.     Heart sounds: No murmur heard.    Comments: Trace pitting LE edema, bilateral. Pulmonary:     Effort: Pulmonary effort is normal. No respiratory distress.     Breath sounds: Normal breath sounds.  Abdominal:     Palpations: Abdomen is soft. There is no hepatomegaly or mass.     Tenderness: There is no abdominal tenderness.  Lymphadenopathy:     Cervical: No cervical adenopathy.  Skin:    General: Skin is warm.     Findings: No erythema or rash.  Neurological:     Mental Status: He is alert and oriented to person, place, and time.     Cranial Nerves: No cranial nerve deficit.     Gait: Gait normal.  Psychiatric:        Mood and Affect: Mood and affect normal.   ASSESSMENT AND PLAN:   Mr.Md was seen today for follow-up.  Diagnoses and all orders for this visit: Orders Placed This Encounter  Procedures   Flu Vaccine QUAD High Dose(Fluad)   Potassium   Lab Results  Component Value Date   CREATININE 1.09 08/01/2022   BUN 13 08/01/2022   NA 142 08/01/2022   K 4.7 08/06/2022   CL 107 08/01/2022   CO2 25 08/01/2022   Hypokalemia Nausea and vomiting have resolved, and eating normal,so hypoK+ may have resolved. For now no changes in KLOR supplementation. Further  recommendations according to K+ result.  Essential hypertension BP adequately controlled. Continue current management: Losartan 50 mg and Amlodiine 5 mg daily. DASH/low salt diet to continue. Continue monitoring BP at home. Eye exam is current.  Thoracic aortic aneurysm (HCC) Instructed about warning signs. Recommend avoiding quinolones. Chest CT in 02/2023.  Need for influenza vaccination -     Flu Vaccine QUAD High Dose(Fluad)  Return in about 6 months (around 02/04/2023) for F/U and fasting labs.  Kasch Borquez G. [place], MD  [facility]. [place] office.

## 2022-08-06 ENCOUNTER — Telehealth: Payer: Self-pay

## 2022-08-06 ENCOUNTER — Other Ambulatory Visit: Payer: Self-pay | Admitting: Urology

## 2022-08-06 ENCOUNTER — Ambulatory Visit: Payer: Medicare Other | Admitting: Family Medicine

## 2022-08-06 VITALS — BP 132/68 | HR 62 | Resp 18 | Ht 65.0 in | Wt 149.1 lb

## 2022-08-06 DIAGNOSIS — I10 Essential (primary) hypertension: Secondary | ICD-10-CM | POA: Diagnosis not present

## 2022-08-06 DIAGNOSIS — I7122 Aneurysm of the aortic arch, without rupture: Secondary | ICD-10-CM | POA: Diagnosis not present

## 2022-08-06 DIAGNOSIS — Z23 Encounter for immunization: Secondary | ICD-10-CM

## 2022-08-06 DIAGNOSIS — E876 Hypokalemia: Secondary | ICD-10-CM

## 2022-08-06 LAB — POTASSIUM: Potassium: 4.7 mEq/L (ref 3.5–5.1)

## 2022-08-06 NOTE — Patient Instructions (Addendum)
A few things to remember from today's visit:  Essential hypertension  Hypokalemia - Plan: Potassium  Need for influenza vaccination - Plan: Flu Vaccine QUAD High Dose(Fluad)  Aneurysm of aortic arch without rupture (HCC)  No changes today. If potassium is normal today we can stop potassium tabs. Chest CT in 02/2023 to follow on aneurysm.   If you need refills for medications you take chronically, please call your pharmacy. Do not use My Chart to request refills or for acute issues that need immediate attention. If you send a my chart message, it may take a few days to be addressed, specially if I am not in the office.  Please be sure medication list is accurate. If a new problem present, please set up appointment sooner than planned today.

## 2022-08-06 NOTE — Assessment & Plan Note (Signed)
BP adequately controlled. Continue current management: Losartan 50 mg and Amlodiine 5 mg daily. DASH/low salt diet to continue. Continue monitoring BP at home. Eye exam is current.

## 2022-08-06 NOTE — Assessment & Plan Note (Signed)
Instructed about warning signs. Recommend avoiding quinolones. Chest CT in 02/2023.

## 2022-08-06 NOTE — Telephone Encounter (Signed)
     Patient  visit on 9/17  at [facility] you been able to follow up with your primary care physician? YES  The patient was or was not able to obtain any needed medicine or equipment.  YES  Are there diet recommendations that you are having difficulty following?NA  Patient expresses understanding of discharge instructions and education provided has no other needs at this time. [place], [facility], Care Management  [phone] [address], [place], [place] [address] Phone: [phone] Email: [email or web address]

## 2022-08-07 ENCOUNTER — Encounter: Payer: Self-pay | Admitting: Family Medicine

## 2022-08-11 NOTE — Patient Instructions (Signed)
SURGICAL WAITING ROOM VISITATION Patients having surgery or a procedure may have no more than 2 support people in the waiting area - these visitors may rotate in the visitor waiting room.   Children under the age of 75 must have an adult with them who is not the patient. If the patient needs to stay at the hospital during part of their recovery, the visitor guidelines for inpatient rooms apply.  PRE-OP VISITATION  Pre-op nurse will coordinate an appropriate time for 1 support person to accompany the patient in pre-op.  This support person may not rotate.  This visitor will be contacted when the time is appropriate for the visitor to come back in the pre-op area.  Please refer to the [facility] website for the visitor guidelines for Inpatients (after your surgery is over and you are in a regular room).  You are not required to quarantine at this time prior to your surgery. However, you must do this: Hand Hygiene often Do NOT share personal items Notify your provider if you are in close contact with someone who has COVID or you develop fever 100.4 or greater, new onset of sneezing, cough, sore throat, shortness of breath or body aches.   If you received a COVID test during your pre-op visit  it is requested that you wear a mask when out in public, stay away from anyone that may not be feeling well and notify your surgeon if you develop symptoms. If you test positive for Covid or have been in contact with anyone that has tested positive in the last 10 days please notify you surgeon.       Your procedure is scheduled on:  Thursday  August 19, 2022  Report to [facility] Main Entrance.  Report to admitting at: 09:00  AM  +++++Call this number if you have any questions or problems the morning of surgery [phone]  Do not eat food :After Midnight the night prior to your surgery/procedure.  After Midnight you may have the following liquids until    08:15 AM  DAY OF SURGERY  Clear  Liquid Diet Water Black Coffee (sugar ok, NO MILK/CREAM OR CREAMERS)  Tea (sugar ok, NO MILK/CREAM OR CREAMERS) regular and decaf                             Plain Jell-O (NO RED)                                           Fruit ices (not with fruit pulp, NO RED)                                     Popsicles (NO RED)                                                                  Juice: apple, WHITE grape, WHITE cranberry Sports drinks like Gatorade (NO RED)                 FOLLOW BOWEL PREP  AND ANY ADDITIONAL PRE OP INSTRUCTIONS YOU RECEIVED FROM YOUR SURGEON'S OFFICE!!!   Oral Hygiene is also important to reduce your risk of infection.        Remember - BRUSH YOUR TEETH THE MORNING OF SURGERY WITH YOUR REGULAR TOOTHPASTE  Do NOT smoke after Midnight the night before surgery.  Take ONLY these medicines the morning of surgery with A SIP OF WATER:  If needed you may take Percocet for pain, and Zofran ODT for nausea, You may use your Albuterol or Flovent inhalers,                     You may not have any metal on your body including  jewelry, and body piercing  Do not wear  lotions, powders, cologne, or deodorant   Men may shave face and neck.  Contacts, Hearing Aids, dentures or bridgework may not be worn into surgery.    DO NOT BRING YOUR HOME MEDICATIONS TO THE HOSPITAL. PHARMACY WILL DISPENSE MEDICATIONS LISTED ON YOUR MEDICATION LIST TO YOU DURING YOUR ADMISSION IN THE HOSPITAL!   Patients discharged on the day of surgery will not be allowed to drive home.  Someone NEEDS to stay with you for the first 24 hours after anesthesia.  Special Instructions: Bring a copy of your healthcare power of attorney and living will documents the day of surgery, if you wish to have them scanned into your [facility] Records- EPIC  Please read over the following fact sheets you were given: IF YOU HAVE QUESTIONS ABOUT YOUR PRE-OP INSTRUCTIONS, PLEASE CALL [phone]  (KAY)   [facility] - Preparing for Surgery Before surgery, you can play an important role.  Because skin is not sterile, your skin needs to be as free of germs as possible.  You can reduce the number of germs on your skin by washing with CHG (chlorahexidine gluconate) soap before surgery.  CHG is an antiseptic cleaner which kills germs and bonds with the skin to continue killing germs even after washing. Please DO NOT use if you have an allergy to CHG or antibacterial soaps.  If your skin becomes reddened/irritated stop using the CHG and inform your nurse when you arrive at Short Stay. Do not shave (including legs and underarms) for at least 48 hours prior to the first CHG shower.  You may shave your face/neck.  Please follow these instructions carefully:  1.  Shower with CHG Soap the night before surgery and the  morning of surgery.  2.  If you choose to wash your hair, wash your hair first as usual with your normal  shampoo.  3.  After you shampoo, rinse your hair and body thoroughly to remove the shampoo.                             4.  Use CHG as you would any other liquid soap.  You can apply chg directly to the skin and wash.  Gently with a scrungie or clean washcloth.  5.  Apply the CHG Soap to your body ONLY FROM THE NECK DOWN.   Do not use on face/ open                           Wound or open sores. Avoid contact with eyes, ears mouth and genitals (private parts).  Wash face,  Genitals (private parts) with your normal soap.             6.  Wash thoroughly, paying special attention to the area where your  surgery  will be performed.  7.  Thoroughly rinse your body with warm water from the neck down.  8.  DO NOT shower/wash with your normal soap after using and rinsing off the CHG Soap.            9.  Pat yourself dry with a clean towel.            10.  Wear clean pajamas.            11.  Place clean sheets on your bed the night of your first shower and do not  sleep with  pets.  ON THE DAY OF SURGERY : Do not apply any lotions/deodorants the morning of surgery.  Please wear clean clothes to the hospital/surgery center.    FAILURE TO FOLLOW THESE INSTRUCTIONS MAY RESULT IN THE CANCELLATION OF YOUR SURGERY  PATIENT SIGNATURE_________________________________  NURSE SIGNATURE__________________________________  ________________________________________________________________________

## 2022-08-11 NOTE — Progress Notes (Addendum)
COVID Vaccine received:  []  No [x]  Yes Date of any COVID positive Test in last 90 days: None  PCP - [name]. [name], MD Cardiologist - Phineas Inches, MD  Chest x-ray -  EKG -  08-01-2022  Epic Stress Test -  ECHO - 04-18-2020  Epic Cardiac Cath -  CT Chest / Aorta - 02-16-2022  Epic  Pacemaker/ICD device     [x]  N/A Spinal Cord Stimulator:[]  No []  Yes      (Remind patient to bring remote DOS) Other Implants:   Bowel Prep - none per patient  History of Sleep Apnea? [x]  No []  Yes   Sleep Study Date:   CPAP used?- [x]  No []  Yes  (Instruct to bring their mask & Tubing)  Does the patient monitor blood sugar? []  No []  Yes  [x]  N/A   Blood Thinner Instructions: Aspirin Instructions:ASA 81mg , Hold X5 Days Last Dose:08-13-2022  Patient aware  ERAS Protocol Ordered: [x]  No  []  Yes PRE-SURGERY []  ENSURE  []  G2   Comments: patient took 5 days of KlorCon and now his potassium has increased from 2.9 on 08-01-22 to 4.7 on 08-06-2022  Activity level: Patient can climb a flight of stairs without difficulty;  [x]  No CP  [x]  No SOB  Anesthesia review: Asthma, Myasthenia Graves (Patient says dx was years ago), Heart Murmur, HFpEF, TAA  Patient denies shortness of breath, fever, cough and chest pain at PAT appointment.  Patient verbalized understanding and agreement to the Pre-Surgical Instructions that were given to them at this PAT appointment. Patient was also educated of the need to review these PAT instructions again prior to his/her surgery.I reviewed the appropriate phone numbers to call if they have any and questions or concerns.

## 2022-08-13 ENCOUNTER — Encounter (HOSPITAL_COMMUNITY): Payer: Self-pay

## 2022-08-13 ENCOUNTER — Other Ambulatory Visit: Payer: Self-pay

## 2022-08-13 ENCOUNTER — Encounter (HOSPITAL_COMMUNITY)
Admission: RE | Admit: 2022-08-13 | Discharge: 2022-08-13 | Disposition: A | Discharge status: Home / Self Care | Payer: Medicare Other | Source: Ambulatory Visit | Attending: Urology | Admitting: Urology

## 2022-08-13 DIAGNOSIS — G7000 Myasthenia gravis without (acute) exacerbation: Secondary | ICD-10-CM | POA: Diagnosis not present

## 2022-08-13 DIAGNOSIS — I509 Heart failure, unspecified: Secondary | ICD-10-CM | POA: Insufficient documentation

## 2022-08-13 DIAGNOSIS — N201 Calculus of ureter: Secondary | ICD-10-CM | POA: Diagnosis not present

## 2022-08-13 DIAGNOSIS — I110 Hypertensive heart disease with heart failure: Secondary | ICD-10-CM | POA: Insufficient documentation

## 2022-08-13 DIAGNOSIS — Z01812 Encounter for preprocedural laboratory examination: Secondary | ICD-10-CM | POA: Diagnosis not present

## 2022-08-13 DIAGNOSIS — N210 Calculus in bladder: Secondary | ICD-10-CM | POA: Diagnosis not present

## 2022-08-13 HISTORY — DX: Cardiac murmur, unspecified: R01.1

## 2022-08-13 HISTORY — DX: Thoracic aortic aneurysm, without rupture, unspecified: I71.20

## 2022-08-13 HISTORY — DX: Heart failure, unspecified: I50.9

## 2022-08-16 NOTE — Anesthesia Preprocedure Evaluation (Signed)
Anesthesia Evaluation  Patient identified by MRN, date of birth, ID band Patient awake    Reviewed: Allergy & Precautions, NPO status , Patient's Chart, lab work & pertinent test results  Airway Mallampati: II  TM Distance: >3 FB Neck ROM: Full    Dental  (+) Upper Dentures, Lower Dentures   Pulmonary asthma ,    Pulmonary exam normal breath sounds clear to auscultation       Cardiovascular hypertension, Pt. on medications + Peripheral Vascular Disease  Normal cardiovascular exam Rhythm:Regular Rate:Normal     Neuro/Psych negative neurological ROS  negative psych ROS   GI/Hepatic Neg liver ROS, GERD  ,  Endo/Other  negative endocrine ROS  Renal/GU negative Renal ROS  negative genitourinary   Musculoskeletal negative musculoskeletal ROS (+)   Abdominal   Peds negative pediatric ROS (+)  Hematology negative hematology ROS (+)   Anesthesia Other Findings   Reproductive/Obstetrics negative OB ROS                           Anesthesia Physical Anesthesia Plan  ASA: 3  Anesthesia Plan: General   Post-op Pain Management:    Induction: Intravenous  PONV Risk Score and Plan: 2 and Ondansetron, Dexamethasone and Treatment may vary due to age or medical condition  Airway Management Planned: LMA  Additional Equipment:   Intra-op Plan:   Post-operative Plan: Extubation in OR  Informed Consent: I have reviewed the patients History and Physical, chart, labs and discussed the procedure including the risks, benefits and alternatives for the proposed anesthesia with the patient or authorized representative who has indicated his/her understanding and acceptance.     Dental advisory given  Plan Discussed with: CRNA and Surgeon  Anesthesia Plan Comments: (See PAT note 08/13/2022)       Anesthesia Quick Evaluation

## 2022-08-16 NOTE — Progress Notes (Signed)
Anesthesia Chart Review   Case: [id number] Date/Time: 08/19/22 1100   Procedures:      CYSTOSCOPY/RETROGRADE/URETEROSCOPY/HOLMIUM LASER/STENT PLACEMENT (Right) - ONLY NEEDS 75 MIN FOR ALL PROCEDURES     CYSTOSCOPY WITH LITHOLAPAXY   Anesthesia type: General   Pre-op diagnosis: RIGHT URETERAL STONE, BLADDER STONES   Location: [facility]   Surgeons: Janith Lima, MD       DISCUSSION:86 y.o. never smoker with h/o HTN, CHF, thoracic aortic aneurysm, Myasthenia Gravis, right ureteral stone, bladder stone scheduled for above procedure 08/19/2022 with Dr. Rexene Alberts.   Pt found to have an ascending thoracic aortic aneurysm that Measured 4.3 cm in March 2022.  Last seen by cardiothoracic surgery 03/18/2022. Per OV note he will return in 1 year for recheck and CTA.   Chest CT 02/16/22: Dilated aortic root measuring 4.3-4.4 cm and mildly dilated ascending thoracic aorta measuring 4.1 cm. Ascending thoracic aortic measurement is slightly smaller on the current study compared to a prior measurement of 4.3 cm.   Pt last seen by cardiology 03/03/2022. Per OV note, "He does not have a history of ischemic heart disease or stroke.  He can complete more than 4 METS without angina.  He takes aspirin for primary prevention.  According to the [facility] he has a 0.4% risk of Mace during the perioperative period.  He may hold aspirin 5 to 7 days prior to procedure."  Last seen by PCP 08/06/2022.   Anticipate pt can proceed with planned procedure barring acute status change.   VS: BP (!) 149/88 Comment: Right arm sitting  Pulse 72   Temp 36.8 C   Resp 16   Ht 5\' 5"  (1.651 m)   Wt 66.2 kg   SpO2 99%   BMI 24.30 kg/m   PROVIDERS: [name], [name], MD is PCP   Cardiologist - Phineas Inches, MD LABS: Labs reviewed: Acceptable for surgery. (all labs ordered are listed, but only abnormal results are displayed)  Labs Reviewed - No data to display   IMAGES:   EKG:   CV: Echo 04/18/2020 1. Left  ventricular ejection fraction, by estimation, is 65 to 70%. The  left ventricle has normal function. The left ventricle has no regional  wall motion abnormalities. There is moderate concentric left ventricular  hypertrophy. Left ventricular  diastolic parameters are consistent with Grade I diastolic dysfunction  (impaired relaxation). Elevated left atrial pressure.   2. Right ventricular systolic function is normal. The right ventricular  size is normal.   3. The mitral valve is normal in structure. Mild mitral valve  regurgitation. No evidence of mitral stenosis.   4. The aortic valve is normal in structure. Aortic valve regurgitation is  mild. Mild to moderate aortic valve sclerosis/calcification is present,  without any evidence of aortic stenosis.   5. Aortic dilatation noted. There is mild dilatation of the ascending  aorta measuring 43 mm.   6. The inferior vena cava is normal in size with greater than 50%  respiratory variability, suggesting right atrial pressure of 3 mmHg.  Past Medical History:  Diagnosis Date   Allergy    SEASONAL   Arthritis    Asthma    BPH (benign prostatic hypertrophy)    CHF (congestive heart failure) (HCC)    GERD (gastroesophageal reflux disease)    Heart murmur    History of kidney stones    History of myasthenia gravis    15 yrs ago - no current problem or treatment  Hypertension    Mild carotid artery disease ([place])    bilateral ICA 0-39%  per duplex 02-16-2013   Multiple pulmonary nodules    per last CT stable   Nephrolithiasis    BILATERAL   Thoracic aortic aneurysm (TAA) [facility])     Past Surgical History:  Procedure Laterality Date   CATARACT EXTRACTION W/ INTRAOCULAR LENS  IMPLANT, BILATERAL  2007   COLONOSCOPY     COLONOSCOPY W/ POLYPECTOMY  05-14-2010   CYSTO/  LEFT RETROGRADE PYELOGRAM/  LEFT URETEROSCOPY/  LEFT STENT PLACEMENT  06-17-2008   CYSTOSCOPY W/ RETROGRADES Bilateral 03/24/2015   Procedure: CYSTOSCOPY WITH RETROGRADE  PYELOGRAM;  Surgeon: Kathie Rhodes, MD;  Location: [place];  Service: Urology;  Laterality: Bilateral;   CYSTOSCOPY WITH STENT PLACEMENT Right 03/24/2015   Procedure: CYSTOSCOPY WITH STENT PLACEMENT;  Surgeon: Kathie Rhodes, MD;  Location: [facility];  Service: Urology;  Laterality: Right;   CYSTOSCOPY WITH URETEROSCOPY Right 03/24/2015   Procedure: CYSTOSCOPY WITH URETEROSCOPY;  Surgeon: Kathie Rhodes, MD;  Location: [facility];  Service: Urology;  Laterality: Right;   EXTRACORPOREAL SHOCK WAVE LITHOTRIPSY Bilateral left 02-23-2010 //   right 09-28-2010   HOLMIUM LASER APPLICATION Right 07/18/8181   Procedure: HOLMIUM LASER APPLICATION;  Surgeon: Kathie Rhodes, MD;  Location: [facility];  Service: Urology;  Laterality: Right;   INGUINAL HERNIA REPAIR Bilateral 2002 approx.   NEPHROLITHOTOMY Left 05/30/2015   Procedure: LEFT PERCUTANEOUS NEPHROLITHOTOMY;  Surgeon: Kathie Rhodes, MD;  Location: [facility];  Service: Urology;  Laterality: Left;   POLYPECTOMY     STONE EXTRACTION WITH BASKET Right 03/24/2015   Procedure: STONE EXTRACTION WITH BASKET;  Surgeon: Kathie Rhodes, MD;  Location: [facility];  Service: Urology;  Laterality: Right;    MEDICATIONS:  albuterol (VENTOLIN HFA) 108 (90 Base) MCG/ACT inhaler   amLODipine (NORVASC) 5 MG tablet   aspirin 81 MG EC tablet   calcium carbonate (TUMS - DOSED IN MG ELEMENTAL CALCIUM) 500 MG chewable tablet   ciprofloxacin (CIPRO) 500 MG tablet   FLOVENT HFA 44 MCG/ACT inhaler   losartan (COZAAR) 50 MG tablet   montelukast (SINGULAIR) 10 MG tablet   Multiple Vitamin (MULTIVITAMIN) tablet   ondansetron (ZOFRAN-ODT) 8 MG disintegrating tablet   oxyCODONE-acetaminophen (PERCOCET) 5-325 MG tablet   polyvinyl alcohol (LIQUIFILM TEARS) 1.4 % ophthalmic solution   potassium chloride SA (KLOR-CON M) 20 MEQ tablet   rosuvastatin (CRESTOR) 10 MG tablet   Travoprost, BAK Free, (TRAVATAN) 0.004  % SOLN ophthalmic solution   No current facility-administered medications for this encounter.    Konrad Felix Ward, PA-C [facility] [phone]

## 2022-08-18 ENCOUNTER — Ambulatory Visit (INDEPENDENT_AMBULATORY_CARE_PROVIDER_SITE_OTHER): Payer: Medicare Other

## 2022-08-18 VITALS — BP 120/64 | HR 62 | Temp 98.1°F | Ht 65.0 in | Wt 140.5 lb

## 2022-08-18 DIAGNOSIS — Z0000 Encounter for general adult medical examination without abnormal findings: Secondary | ICD-10-CM

## 2022-08-18 NOTE — Patient Instructions (Addendum)
[name] , Thank you for taking time to come for your Medicare Wellness Visit. I appreciate your ongoing commitment to your health goals. Please review the following plan we discussed and let me know if I can assist you in the future.   These are the goals we discussed:  Goals       Acknowledge receipt of Advanced Directive package     Please review forms and consider signing them.     Complete yard work project     Exercise 150 minutes per week (moderate activity)     Get the punching bag back out; start slow      Exercise 3x per week (30 min per time)     Try to walk 3-4 times per week as exercise. Be careful with falls.     Patient Stated     None at this time     Track and Manage My Blood Pressure-Hypertension     Timeframe:  Short-Term Goal Priority:  High Start Date:                             Expected End Date:                       Follow Up Date 08/12/2021    - check blood pressure daily - choose a place to take my blood pressure (home, clinic or office, retail store) - write blood pressure results in a log or diary    Why is this important?   You won't feel high blood pressure, but it can still hurt your blood vessels.  High blood pressure can cause heart or kidney problems. It can also cause a stroke.  Making lifestyle changes like losing a little weight or eating less salt will help.  Checking your blood pressure at home and at different times of the day can help to control blood pressure.  If the doctor prescribes medicine remember to take it the way the doctor ordered.  Call the office if you cannot afford the medicine or if there are questions about it.     Notes:         This is a list of the screening recommended for you and due dates:  Health Maintenance  Topic Date Due   COVID-19 Vaccine (5 - Pfizer series) 09/03/2022*   Zoster (Shingles) Vaccine (1 of 2) 11/19/2022*   Pneumonia Vaccine  Completed   Flu Shot  Completed   HPV Vaccine  Aged  Out   Tetanus Vaccine  Discontinued  *Topic was postponed. The date shown is not the original due date.   Opioid Pain Medicine Management Opioids are powerful medicines that are used to treat moderate to severe pain. When used for short periods of time, they can help you to: Sleep better. Do better in physical or occupational therapy. Feel better in the first few days after an injury. Recover from surgery. Opioids should be taken with the supervision of a trained health care provider. They should be taken for the shortest period of time possible. This is because opioids can be addictive, and the longer you take opioids, the greater your risk of addiction. This addiction can also be called opioid use disorder. What are the risks? Using opioid pain medicines for longer than 3 days increases your risk of side effects. Side effects include: Constipation. Nausea and vomiting. Breathing difficulties (respiratory depression). Drowsiness. Confusion. Opioid use disorder.  Itching. Taking opioid pain medicine for a long period of time can affect your ability to do daily tasks. It also puts you at risk for: Motor vehicle crashes. Depression. Suicide. Heart attack. Overdose, which can be life-threatening. What is a pain treatment plan? A pain treatment plan is an agreement between you and your health care provider. Pain is unique to each person, and treatments vary depending on your condition. To manage your pain, you and your health care provider need to work together. To help you do this: Discuss the goals of your treatment, including how much pain you might expect to have and how you will manage the pain. Review the risks and benefits of taking opioid medicines. Remember that a good treatment plan uses more than one approach and minimizes the chance of side effects. Be honest about the amount of medicines you take and about any drug or alcohol use. Get pain medicine prescriptions from only one  health care provider. Pain can be managed with many types of alternative treatments. Ask your health care provider to refer you to one or more specialists who can help you manage pain through: Physical or occupational therapy. Counseling (cognitive behavioral therapy). Good nutrition. Biofeedback. Massage. Meditation. Non-opioid medicine. Following a gentle exercise program. How to use opioid pain medicine Taking medicine Take your pain medicine exactly as told by your health care provider. Take it only when you need it. If your pain gets less severe, you may take less than your prescribed dose if your health care provider approves. If you are not having pain, do nottake pain medicine unless your health care provider tells you to take it. If your pain is severe, do nottry to treat it yourself by taking more pills than instructed on your prescription. Contact your health care provider for help. Write down the times when you take your pain medicine. It is easy to become confused while on pain medicine. Writing the time can help you avoid overdose. Take other over-the-counter or prescription medicines only as told by your health care provider. Keeping yourself and others safe  While you are taking opioid pain medicine: Do not drive, use machinery, or power tools. Do not sign legal documents. Do not drink alcohol. Do not take sleeping pills. Do not supervise children by yourself. Do not do activities that require climbing or being in high places. Do not go to a lake, river, ocean, spa, or swimming pool. Do not share your pain medicine with anyone. Keep pain medicine in a locked cabinet or in a secure area where pets and children cannot reach it. Stopping your use of opioids If you have been taking opioid medicine for more than a few weeks, you may need to slowly decrease (taper) how much you take until you stop completely. Tapering your use of opioids can decrease your risk of symptoms of  withdrawal, such as: Pain and cramping in the abdomen. Nausea. Sweating. Sleepiness. Restlessness. Uncontrollable shaking (tremors). Cravings for the medicine. Do not attempt to taper your use of opioids on your own. Talk with your health care provider about how to do this. Your health care provider may prescribe a step-down schedule based on how much medicine you are taking and how long you have been taking it. Getting rid of leftover pills Do not save any leftover pills. Get rid of leftover pills safely by: Taking the medicine to a prescription take-back program. This is usually offered by the county or law enforcement. Bringing them to a pharmacy that  has a drug disposal container. Flushing them down the toilet. Check the label or package insert of your medicine to see whether this is safe to do. Throwing them out in the trash. Check the label or package insert of your medicine to see whether this is safe to do. If it is safe to throw it out, remove the medicine from the original container, put it into a sealable bag or container, and mix it with used coffee grounds, food scraps, dirt, or cat litter before putting it in the trash. Follow these instructions at home: Activity Do exercises as told by your health care provider. Avoid activities that make your pain worse. Return to your normal activities as told by your health care provider. Ask your health care provider what activities are safe for you. General instructions You may need to take these actions to prevent or treat constipation: Drink enough fluid to keep your urine pale yellow. Take over-the-counter or prescription medicines. Eat foods that are high in fiber, such as beans, whole grains, and fresh fruits and vegetables. Limit foods that are high in fat and processed sugars, such as fried or sweet foods. Keep all follow-up visits. This is important. Where to find support If you have been taking opioids for a long time, you  may benefit from receiving support for quitting from a local support group or counselor. Ask your health care provider for a referral to these resources in your area. Where to find more information Centers for Disease Control and Prevention (CDC): http://www.wolf.info/ [place] Food and Drug Administration (FDA): [place] Get help right away if: You may have taken too much of an opioid (overdosed). Common symptoms of an overdose: Your breathing is slower or more shallow than normal. You have a very slow heartbeat (pulse). You have slurred speech. You have nausea and vomiting. Your pupils become very small. You have other potential symptoms: You are very confused. You faint or feel like you will faint. You have cold, clammy skin. You have blue lips or fingernails. You have thoughts of harming yourself or harming others. These symptoms may represent a serious problem that is an emergency. Do not wait to see if the symptoms will go away. Get medical help right away. Call your local emergency services (911 in the [place] Do not drive yourself to the hospital.  If you ever feel like you may hurt yourself or others, or have thoughts about taking your own life, get help right away. Go to your nearest emergency department or: Call your local emergency services (911 in the [place] Call the [facility] (414)621-3882 in the [place] Call a suicide crisis helpline, such as the [place] at 580-086-4358 or 988 in the [place]. This is open 24 hours a day in the [place] Text the Crisis Text Line at 2070588433 (in the [place] Summary Opioid medicines can help you manage moderate to severe pain for a short period of time. A pain treatment plan is an agreement between you and your health care provider. Discuss the goals of your treatment, including how much pain you might expect to have and how you will manage the pain. If you think that you or someone else may have taken too much of  an opioid, get medical help right away. This information is not intended to replace advice given to you by your health care provider. Make sure you discuss any questions you have with your health care provider. Document Revised: 05/27/2021 Document Reviewed: 02/11/2021 Elsevier Patient  Education  2023 [employer].  Advanced directives: Advance directive discussed with you today. Even though you declined this today, please call our office should you change your mind, and we can give you the proper paperwork for you to fill out.   Conditions/risks identified: None  Next appointment: Follow up in one year for your annual wellness visit.   Preventive Care 36 Years and Older, Male  Preventive care refers to lifestyle choices and visits with your health care provider that can promote health and wellness. What does preventive care include? A yearly physical exam. This is also called an annual well check. Dental exams once or twice a year. Routine eye exams. Ask your health care provider how often you should have your eyes checked. Personal lifestyle choices, including: Daily care of your teeth and gums. Regular physical activity. Eating a healthy diet. Avoiding tobacco and drug use. Limiting alcohol use. Practicing safe sex. Taking low doses of aspirin every day. Taking vitamin and mineral supplements as recommended by your health care provider. What happens during an annual well check? The services and screenings done by your health care provider during your annual well check will depend on your age, overall health, lifestyle risk factors, and family history of disease. Counseling  Your health care provider may ask you questions about your: Alcohol use. Tobacco use. Drug use. Emotional well-being. Home and relationship well-being. Sexual activity. Eating habits. History of falls. Memory and ability to understand (cognition). Work and work Astronomer. Screening  You may have the  following tests or measurements: Height, weight, and BMI. Blood pressure. Lipid and cholesterol levels. These may be checked every 5 years, or more frequently if you are over 68 years old. Skin check. Lung cancer screening. You may have this screening every year starting at age 39 if you have a 30-pack-year history of smoking and currently smoke or have quit within the past 15 years. Fecal occult blood test (FOBT) of the stool. You may have this test every year starting at age 68. Flexible sigmoidoscopy or colonoscopy. You may have a sigmoidoscopy every 5 years or a colonoscopy every 10 years starting at age 41. Prostate cancer screening. Recommendations will vary depending on your family history and other risks. Hepatitis C blood test. Hepatitis B blood test. Sexually transmitted disease (STD) testing. Diabetes screening. This is done by checking your blood sugar (glucose) after you have not eaten for a while (fasting). You may have this done every 1-3 years. Abdominal aortic aneurysm (AAA) screening. You may need this if you are a current or former smoker. Osteoporosis. You may be screened starting at age 70 if you are at high risk. Talk with your health care provider about your test results, treatment options, and if necessary, the need for more tests. Vaccines  Your health care provider may recommend certain vaccines, such as: Influenza vaccine. This is recommended every year. Tetanus, diphtheria, and acellular pertussis (Tdap, Td) vaccine. You may need a Td booster every 10 years. Zoster vaccine. You may need this after age 47. Pneumococcal 13-valent conjugate (PCV13) vaccine. One dose is recommended after age 83. Pneumococcal polysaccharide (PPSV23) vaccine. One dose is recommended after age 46. Talk to your health care provider about which screenings and vaccines you need and how often you need them. This information is not intended to replace advice given to you by your health care  provider. Make sure you discuss any questions you have with your health care provider. Document Released: 11/28/2015 Document Revised: 07/21/2016 Document  Reviewed: 09/02/2015 Elsevier Interactive Patient Education  2017 [employer].  Fall Prevention in the Home Falls can cause injuries. They can happen to people of all ages. There are many things you can do to make your home safe and to help prevent falls. What can I do on the outside of my home? Regularly fix the edges of walkways and driveways and fix any cracks. Remove anything that might make you trip as you walk through a door, such as a raised step or threshold. Trim any bushes or trees on the path to your home. Use bright outdoor lighting. Clear any walking paths of anything that might make someone trip, such as rocks or tools. Regularly check to see if handrails are loose or broken. Make sure that both sides of any steps have handrails. Any raised decks and porches should have guardrails on the edges. Have any leaves, snow, or ice cleared regularly. Use sand or salt on walking paths during winter. Clean up any spills in your garage right away. This includes oil or grease spills. What can I do in the bathroom? Use night lights. Install grab bars by the toilet and in the tub and shower. Do not use towel bars as grab bars. Use non-skid mats or decals in the tub or shower. If you need to sit down in the shower, use a plastic, non-slip stool. Keep the floor dry. Clean up any water that spills on the floor as soon as it happens. Remove soap buildup in the tub or shower regularly. Attach bath mats securely with double-sided non-slip rug tape. Do not have throw rugs and other things on the floor that can make you trip. What can I do in the bedroom? Use night lights. Make sure that you have a light by your bed that is easy to reach. Do not use any sheets or blankets that are too big for your bed. They should not hang down onto the  floor. Have a firm chair that has side arms. You can use this for support while you get dressed. Do not have throw rugs and other things on the floor that can make you trip. What can I do in the kitchen? Clean up any spills right away. Avoid walking on wet floors. Keep items that you use a lot in easy-to-reach places. If you need to reach something above you, use a strong step stool that has a grab bar. Keep electrical cords out of the way. Do not use floor polish or wax that makes floors slippery. If you must use wax, use non-skid floor wax. Do not have throw rugs and other things on the floor that can make you trip. What can I do with my stairs? Do not leave any items on the stairs. Make sure that there are handrails on both sides of the stairs and use them. Fix handrails that are broken or loose. Make sure that handrails are as long as the stairways. Check any carpeting to make sure that it is firmly attached to the stairs. Fix any carpet that is loose or worn. Avoid having throw rugs at the top or bottom of the stairs. If you do have throw rugs, attach them to the floor with carpet tape. Make sure that you have a light switch at the top of the stairs and the bottom of the stairs. If you do not have them, ask someone to add them for you. What else can I do to help prevent falls? Wear shoes that: Do not  have high heels. Have rubber bottoms. Are comfortable and fit you well. Are closed at the toe. Do not wear sandals. If you use a stepladder: Make sure that it is fully opened. Do not climb a closed stepladder. Make sure that both sides of the stepladder are locked into place. Ask someone to hold it for you, if possible. Clearly mark and make sure that you can see: Any grab bars or handrails. First and last steps. Where the edge of each step is. Use tools that help you move around (mobility aids) if they are needed. These include: Canes. Walkers. Scooters. Crutches. Turn on the  lights when you go into a dark area. Replace any light bulbs as soon as they burn out. Set up your furniture so you have a clear path. Avoid moving your furniture around. If any of your floors are uneven, fix them. If there are any pets around you, be aware of where they are. Review your medicines with your doctor. Some medicines can make you feel dizzy. This can increase your chance of falling. Ask your doctor what other things that you can do to help prevent falls. This information is not intended to replace advice given to you by your health care provider. Make sure you discuss any questions you have with your health care provider. Document Released: 08/28/2009 Document Revised: 04/08/2016 Document Reviewed: 12/06/2014 Elsevier Interactive Patient Education  2017 [employer].

## 2022-08-18 NOTE — Progress Notes (Signed)
Subjective:   [name] is a 87 y.o. male who presents for Medicare Annual/Subsequent preventive examination.  Review of Systems     Cardiac Risk Factors include: advanced age (>355men, 69>65 women);hypertension;male gender     Objective:    Today's Vitals   08/18/22 0818 08/18/22 0825  BP: 120/64   Pulse: 62   Temp: 98.1 F (36.7 C)   TempSrc: Oral   SpO2: 99%   Weight: 140 lb 8 oz (63.7 kg)   Height: 5\' 5"  (1.651 m)   PainSc:  0-No pain   Body mass index is 23.38 kg/m.     08/18/2022    8:31 AM 08/13/2022    9:24 AM 08/01/2022   11:10 PM 08/04/2021   11:40 AM 11/15/2019   12:43 PM 12/13/2017    1:01 PM 04/06/2017    9:08 AM  Advanced Directives  Does Patient Have a Medical Advance Directive? No No No No No No No  Would patient like information on creating a medical advance directive? No - Patient declined No - Patient declined  Yes (MAU/Ambulatory/Procedural Areas - Information given)       Current Medications (verified) Outpatient Encounter Medications as of 08/18/2022  Medication Sig   albuterol (VENTOLIN HFA) 108 (90 Base) MCG/ACT inhaler Inhale 2 puffs into the lungs every 6 (six) hours as needed for wheezing or shortness of breath.   amLODipine (NORVASC) 5 MG tablet TAKE 1 TABLET BY MOUTH AT BEDTIME   aspirin 81 MG EC tablet Take 81 mg by mouth every morning.   calcium carbonate (TUMS - DOSED IN MG ELEMENTAL CALCIUM) 500 MG chewable tablet Chew 1 tablet by mouth daily as needed for indigestion or heartburn. Reported on 11/19/2015   ciprofloxacin (CIPRO) 500 MG tablet Take 500 mg by mouth 2 (two) times daily.   FLOVENT HFA 44 MCG/ACT inhaler INHALE 1 PUFF BY MOUTH INTO THE LUNGS TWICE DAILY (Patient taking differently: Inhale 1 puff into the lungs 2 (two) times daily.)   losartan (COZAAR) 50 MG tablet TAKE 1 TABLET BY MOUTH DAILY   montelukast (SINGULAIR) 10 MG tablet TAKE 1 TABLET BY MOUTH EVERY NIGHT AT BEDTIME. GENERIC EQUIVALENT FOR SINGULAIR   Multiple  Vitamin (MULTIVITAMIN) tablet Take 1 tablet by mouth every morning.   ondansetron (ZOFRAN-ODT) 8 MG disintegrating tablet Take 1 tablet (8 mg total) by mouth every 8 (eight) hours as needed.   oxyCODONE-acetaminophen (PERCOCET) 5-325 MG tablet Take 1-2 tablets by mouth every 4 (four) hours as needed for severe pain (May cause constipation).   polyvinyl alcohol (LIQUIFILM TEARS) 1.4 % ophthalmic solution Place 1 drop into both eyes daily as needed for dry eyes. Reported on 11/19/2015   potassium chloride SA (KLOR-CON M) 20 MEQ tablet Take 1 tablet (20 mEq total) by mouth daily for 5 days. (Patient not taking: Reported on 08/09/2022)   rosuvastatin (CRESTOR) 10 MG tablet TAKE 1 TABLET BY MOUTH DAILY   Travoprost, BAK Free, (TRAVATAN) 0.004 % SOLN ophthalmic solution Place 1 drop into both eyes at bedtime.   No facility-administered encounter medications on file as of 08/18/2022.    Allergies (verified) Lisinopril   History: Past Medical History:  Diagnosis Date   Allergy    SEASONAL   Arthritis    Asthma    BPH (benign prostatic hypertrophy)    CHF (congestive heart failure) (HCC)    GERD (gastroesophageal reflux disease)    Heart murmur    History of kidney stones    History of myasthenia  gravis    15 yrs ago - no current problem or treatment   Hypertension    Mild carotid artery disease ([place])    bilateral ICA 0-39%  per duplex 02-16-2013   Multiple pulmonary nodules    per last CT stable   Nephrolithiasis    BILATERAL   Thoracic aortic aneurysm (TAA) [facility])    Past Surgical History:  Procedure Laterality Date   CATARACT EXTRACTION W/ INTRAOCULAR LENS  IMPLANT, BILATERAL  2007   COLONOSCOPY     COLONOSCOPY W/ POLYPECTOMY  05-14-2010   CYSTO/  LEFT RETROGRADE PYELOGRAM/  LEFT URETEROSCOPY/  LEFT STENT PLACEMENT  06-17-2008   CYSTOSCOPY W/ RETROGRADES Bilateral 03/24/2015   Procedure: CYSTOSCOPY WITH RETROGRADE PYELOGRAM;  Surgeon: Kathie Rhodes, MD;  Location: [facility];  Service: Urology;  Laterality: Bilateral;   CYSTOSCOPY WITH STENT PLACEMENT Right 03/24/2015   Procedure: CYSTOSCOPY WITH STENT PLACEMENT;  Surgeon: Kathie Rhodes, MD;  Location: [facility];  Service: Urology;  Laterality: Right;   CYSTOSCOPY WITH URETEROSCOPY Right 03/24/2015   Procedure: CYSTOSCOPY WITH URETEROSCOPY;  Surgeon: Kathie Rhodes, MD;  Location: [facility];  Service: Urology;  Laterality: Right;   EXTRACORPOREAL SHOCK WAVE LITHOTRIPSY Bilateral left 02-23-2010 //   right 09-28-2010   HOLMIUM LASER APPLICATION Right 07/21/931   Procedure: HOLMIUM LASER APPLICATION;  Surgeon: Kathie Rhodes, MD;  Location: [facility];  Service: Urology;  Laterality: Right;   INGUINAL HERNIA REPAIR Bilateral 2002 approx.   NEPHROLITHOTOMY Left 05/30/2015   Procedure: LEFT PERCUTANEOUS NEPHROLITHOTOMY;  Surgeon: Kathie Rhodes, MD;  Location: [facility];  Service: Urology;  Laterality: Left;   POLYPECTOMY     STONE EXTRACTION WITH BASKET Right 03/24/2015   Procedure: STONE EXTRACTION WITH BASKET;  Surgeon: Kathie Rhodes, MD;  Location: [facility];  Service: Urology;  Laterality: Right;   Family History  Problem Relation Age of Onset   Heart disease Mother    Hypertension Mother    Kidney disease Mother    Heart disease Father    Hypertension Father    Diabetes Sister    Diabetes Sister    Hypercalcemia Neg Hx    Social History   Socioeconomic History   Marital status: Married    Spouse name: Not on file   Number of children: Not on file   Years of education: Not on file   Highest education level: Some college, no degree  Occupational History   Not on file  Tobacco Use   Smoking status: Never   Smokeless tobacco: Never  Vaping Use   Vaping Use: Never used  Substance and Sexual Activity   Alcohol use: No    Alcohol/week: 0.0 standard drinks of alcohol   Drug use: No   Sexual activity: Not Currently  Other Topics Concern   Not  on file  Social History Narrative   Not on file   Social Determinants of Health   Financial Resource Strain: Low Risk  (08/18/2022)   Overall Financial Resource Strain (CARDIA)    Difficulty of Paying Living Expenses: Not hard at all  Food Insecurity: No Food Insecurity (08/18/2022)   Hunger Vital Sign    Worried About Running Out of Food in the Last Year: Never true    [place] in the Last Year: Never true  Transportation Needs: No Transportation Needs (08/18/2022)   PRAPARE - Hydrologist (Medical): No    Lack of Transportation (Non-Medical): No  Physical Activity: Inactive (08/18/2022)   Exercise Vital Sign    Days of Exercise per Week: 0 days    Minutes of Exercise per Session: 0 min  Stress: No Stress Concern Present (08/18/2022)   [employer] of Occupational Health - Occupational Stress Questionnaire    Feeling of Stress : Not at all  Social Connections: Moderately Isolated (08/18/2022)   Social Connection and Isolation Panel [NHANES]    Frequency of Communication with Friends and Family: More than three times a week    Frequency of Social Gatherings with Friends and Family: More than three times a week    Attends Religious Services: Never    Database administrator or Organizations: No    Attends Engineer, structural: Never    Marital Status: Married    Tobacco Counseling Counseling given: Not Answered   Clinical Intake:  Pre-visit preparation completed: No  Pain : No/denies pain Pain Score: 0-No pain     BMI - recorded: 23.38 Nutritional Status: BMI of 19-24  Normal Nutritional Risks: None Diabetes: No  How often do you need to have someone help you when you read instructions, pamphlets, or other written materials from your doctor or pharmacy?: 1 - Never  Diabetic?  No  Interpreter Needed?: No  Information entered by :: Theresa Mulligan LPN   Activities of Daily Living    08/18/2022    8:30 AM 08/13/2022     9:26 AM  In your present state of health, do you have any difficulty performing the following activities:  Hearing? 0   Vision? 0   Difficulty concentrating or making decisions? 0   Walking or climbing stairs? 0   Dressing or bathing? 0   Doing errands, shopping? 0 0  Preparing Food and eating ? N   Using the Toilet? N   In the past six months, have you accidently leaked urine? N   Do you have problems with loss of bowel control? N   Managing your Medications? N   Managing your Finances? N   Housekeeping or managing your Housekeeping? N     Patient Care Team: [place], [name], MD as PCP - General (Family Medicine) Wyline Mood Alben Spittle, MD as PCP - Cardiology (Cardiology) Verner Chol, [facility] as Pharmacist (Pharmacist)  Indicate any recent Medical Services you may have received from other than [facility] providers in the past year (date may be approximate).     Assessment:   This is a routine wellness examination for [name].  Hearing/Vision screen Hearing Screening - Comments:: Denies hearing difficulties   Vision Screening - Comments:: Wears rx glasses - up to date with routine eye exams with  Dr Hyacinth Meeker  Dietary issues and exercise activities discussed: Current Exercise Habits: The patient does not participate in regular exercise at present, Exercise limited by: None identified   Goals Addressed             This Visit's Progress    Complete yard work project         Depression Screen    08/18/2022    8:29 AM 08/06/2022    8:42 AM 02/02/2022    7:25 PM 08/04/2021   11:38 AM 08/04/2021   10:56 AM 08/01/2020   10:15 PM 06/19/2019    8:35 AM  PHQ 2/9 Scores  PHQ - 2 Score 0 0 0 0 0 0 0  PHQ- 9 Score 0 1         Fall Risk    08/18/2022  8:30 AM 08/06/2022    8:43 AM 02/02/2022    7:25 PM 08/04/2021   11:41 AM 08/04/2021   10:56 AM  Fall Risk   Falls in the past year? 0 0 0 0 0  Number falls in past yr: 0 0 0 0   Injury with Fall? 0 0 0 0   Risk for fall due to : No Fall  Risks   Impaired vision   Risk for fall due to: Comment    related to pain in eyes and arthritis in the eye   Follow up Falls prevention discussed  Education provided      FALL RISK PREVENTION PERTAINING TO THE HOME:  Any stairs in or around the home? Yes  If so, are there any without handrails? No  Home free of loose throw rugs in walkways, pet beds, electrical cords, etc? Yes  Adequate lighting in your home to reduce risk of falls? Yes   ASSISTIVE DEVICES UTILIZED TO PREVENT FALLS:  Life alert? No  Use of a cane, walker or w/c? No  Grab bars in the bathroom? No Shower chair or bench in shower? No  Elevated toilet seat or a handicapped toilet? No   TIMED UP AND GO:  Was the test performed? Yes .  Length of time to ambulate 10 feet: 10 sec.   Gait steady and fast without use of assistive device  Cognitive Function:        08/18/2022    8:31 AM 08/04/2021   11:44 AM  6CIT Screen  What Year? 0 points 0 points  What month? 0 points 0 points  What time? 0 points 0 points  Count back from 20 0 points 0 points  Months in reverse 0 points 0 points  Repeat phrase 0 points 0 points  Total Score 0 points 0 points    Immunizations Immunization History  Administered Date(s) Administered   Fluad Quad(high Dose 65+) 10/02/2019, 08/01/2020, 08/04/2021, 08/06/2022   Influenza Whole 10/14/2004, 08/18/2007, 08/28/2008, 09/08/2009, 08/20/2010   Influenza, High Dose Seasonal PF 08/27/2013, 09/10/2015, 12/06/2016, 08/25/2017, 09/18/2018   Influenza,inj,Quad PF,6+ Mos 08/29/2014   PFIZER(Purple Top)SARS-COV-2 Vaccination 01/19/2020, 02/19/2020, 08/21/2020, 04/19/2021   Pneumococcal Conjugate-13 08/29/2014   Pneumococcal Polysaccharide-23 02/18/2005   Td 11/15/1998, 05/13/2008   Zoster, Live 06/03/2009      Flu Vaccine status: Up to date  Pneumococcal vaccine status: Up to date  Covid-19 vaccine status: Completed vaccines  Qualifies for Shingles Vaccine? Yes   Zostavax  completed No   Shingrix Completed?: No.    Education has been provided regarding the importance of this vaccine. Patient has been advised to call insurance company to determine out of pocket expense if they have not yet received this vaccine. Advised may also receive vaccine at local pharmacy or Health Dept. Verbalized acceptance and understanding.  Screening Tests Health Maintenance  Topic Date Due   COVID-19 Vaccine (5 - Pfizer series) 09/03/2022 (Originally 06/14/2021)   Zoster Vaccines- Shingrix (1 of 2) 11/19/2022 (Originally 09/23/1987)   Pneumonia Vaccine 66+ Years old  Completed   INFLUENZA VACCINE  Completed   HPV VACCINES  Aged Out   TETANUS/TDAP  Discontinued    Health Maintenance  There are no preventive care reminders to display for this patient.   Colorectal cancer screening: No longer required.   Lung Cancer Screening: (Low Dose CT Chest recommended if Age 33-80 years, 30 pack-year currently smoking OR have quit w/in 15years.) does not qualify.     Additional Screening:  Hepatitis  C Screening: does not qualify; Completed   Vision Screening: Recommended annual ophthalmology exams for early detection of glaucoma and other disorders of the eye. Is the patient up to date with their annual eye exam?  Yes  Who is the provider or what is the name of the office in which the patient attends annual eye exams? Dr Hyacinth Meeker If pt is not established with a provider, would they like to be referred to a provider to establish care? No .   Dental Screening: Recommended annual dental exams for proper oral hygiene  Community Resource Referral / Chronic Care Management:  CRR required this visit?  No   CCM required this visit?  No      Plan:     I have personally reviewed and noted the following in the patient's chart:   Medical and social history Use of alcohol, tobacco or illicit drugs  Current medications and supplements including opioid prescriptions. Patient is currently  taking opioid prescriptions. Information provided to patient regarding non-opioid alternatives. Patient advised to discuss non-opioid treatment plan with their provider. Functional ability and status Nutritional status Physical activity Advanced directives List of other physicians Hospitalizations, surgeries, and ER visits in previous 12 months Vitals Screenings to include cognitive, depression, and falls Referrals and appointments  In addition, I have reviewed and discussed with patient certain preventive protocols, quality metrics, and best practice recommendations. A written personalized care plan for preventive services as well as general preventive health recommendations were provided to patient.     Tillie Rung, LPN   [id number]/12/[id number]   Nurse Notes: None

## 2022-08-19 ENCOUNTER — Ambulatory Visit (HOSPITAL_COMMUNITY): Payer: Medicare Other

## 2022-08-19 ENCOUNTER — Ambulatory Visit (HOSPITAL_BASED_OUTPATIENT_CLINIC_OR_DEPARTMENT_OTHER): Payer: Medicare Other | Admitting: Anesthesiology

## 2022-08-19 ENCOUNTER — Encounter (HOSPITAL_COMMUNITY)
Admission: RE | Disposition: A | Discharge status: Home / Self Care | Payer: Self-pay | Source: Ambulatory Visit | Attending: Urology

## 2022-08-19 ENCOUNTER — Ambulatory Visit (HOSPITAL_COMMUNITY)
Admission: RE | Admit: 2022-08-19 | Discharge: 2022-08-19 | Disposition: A | Discharge status: Home / Self Care | Payer: Medicare Other | Source: Ambulatory Visit | Attending: Urology | Admitting: Urology

## 2022-08-19 ENCOUNTER — Ambulatory Visit (HOSPITAL_COMMUNITY): Payer: Medicare Other | Admitting: Physician Assistant

## 2022-08-19 ENCOUNTER — Encounter (HOSPITAL_COMMUNITY): Payer: Self-pay | Admitting: Urology

## 2022-08-19 DIAGNOSIS — K219 Gastro-esophageal reflux disease without esophagitis: Secondary | ICD-10-CM | POA: Insufficient documentation

## 2022-08-19 DIAGNOSIS — N200 Calculus of kidney: Secondary | ICD-10-CM | POA: Diagnosis not present

## 2022-08-19 DIAGNOSIS — N210 Calculus in bladder: Secondary | ICD-10-CM

## 2022-08-19 DIAGNOSIS — N401 Enlarged prostate with lower urinary tract symptoms: Secondary | ICD-10-CM | POA: Insufficient documentation

## 2022-08-19 DIAGNOSIS — I739 Peripheral vascular disease, unspecified: Secondary | ICD-10-CM | POA: Insufficient documentation

## 2022-08-19 DIAGNOSIS — G7000 Myasthenia gravis without (acute) exacerbation: Secondary | ICD-10-CM | POA: Insufficient documentation

## 2022-08-19 DIAGNOSIS — N35912 Unspecified bulbous urethral stricture, male: Secondary | ICD-10-CM | POA: Diagnosis not present

## 2022-08-19 DIAGNOSIS — N130 Hydronephrosis with ureteropelvic junction obstruction: Secondary | ICD-10-CM | POA: Diagnosis not present

## 2022-08-19 DIAGNOSIS — N132 Hydronephrosis with renal and ureteral calculous obstruction: Secondary | ICD-10-CM

## 2022-08-19 DIAGNOSIS — I10 Essential (primary) hypertension: Secondary | ICD-10-CM

## 2022-08-19 DIAGNOSIS — J45909 Unspecified asthma, uncomplicated: Secondary | ICD-10-CM | POA: Diagnosis not present

## 2022-08-19 DIAGNOSIS — N35919 Unspecified urethral stricture, male, unspecified site: Secondary | ICD-10-CM | POA: Diagnosis not present

## 2022-08-19 DIAGNOSIS — Z79899 Other long term (current) drug therapy: Secondary | ICD-10-CM | POA: Insufficient documentation

## 2022-08-19 HISTORY — PX: CYSTOSCOPY/URETEROSCOPY/HOLMIUM LASER/STENT PLACEMENT: SHX6546

## 2022-08-19 HISTORY — PX: CYSTOSCOPY WITH LITHOLAPAXY: SHX1425

## 2022-08-19 SURGERY — CYSTOSCOPY/URETEROSCOPY/HOLMIUM LASER/STENT PLACEMENT
Anesthesia: General | Site: Urethra | Laterality: Right

## 2022-08-19 MED ORDER — ACETAMINOPHEN 10 MG/ML IV SOLN: 1000.0000 mg | Freq: Once | INTRAVENOUS | Status: DC | PRN

## 2022-08-19 MED ORDER — ORAL CARE MOUTH RINSE: 15.0000 mL | Freq: Once | OROMUCOSAL | Status: AC

## 2022-08-19 MED ORDER — PHENYLEPHRINE 80 MCG/ML (10ML) SYRINGE FOR IV PUSH (FOR BLOOD PRESSURE SUPPORT)
PREFILLED_SYRINGE | INTRAVENOUS | Status: AC
  Filled 2022-08-19: qty 10

## 2022-08-19 MED ORDER — SODIUM CHLORIDE 0.9 % IR SOLN
Status: DC | PRN
  Administered 2022-08-19: 3000 mL

## 2022-08-19 MED ORDER — ONDANSETRON HCL 4 MG/2ML IJ SOLN
INTRAMUSCULAR | Status: AC
  Filled 2022-08-19: qty 2

## 2022-08-19 MED ORDER — LIDOCAINE 2% (20 MG/ML) 5 ML SYRINGE
INTRAMUSCULAR | Status: DC | PRN
  Administered 2022-08-19: 100 mg via INTRAVENOUS

## 2022-08-19 MED ORDER — FENTANYL CITRATE PF 50 MCG/ML IJ SOSY: 25.0000 ug | PREFILLED_SYRINGE | INTRAMUSCULAR | Status: DC | PRN

## 2022-08-19 MED ORDER — PROPOFOL 10 MG/ML IV BOLUS
INTRAVENOUS | Status: DC | PRN
  Administered 2022-08-19: 120 mg via INTRAVENOUS

## 2022-08-19 MED ORDER — DEXAMETHASONE SODIUM PHOSPHATE 4 MG/ML IJ SOLN
INTRAMUSCULAR | Status: DC | PRN
  Administered 2022-08-19: 5 mg via INTRAVENOUS

## 2022-08-19 MED ORDER — CEFAZOLIN SODIUM-DEXTROSE 2-4 GM/100ML-% IV SOLN
2.0000 g | Freq: Once | INTRAVENOUS | Status: AC
  Administered 2022-08-19: 2 g via INTRAVENOUS
  Filled 2022-08-19: qty 100

## 2022-08-19 MED ORDER — LIDOCAINE HCL (PF) 2 % IJ SOLN
INTRAMUSCULAR | Status: AC
  Filled 2022-08-19: qty 5

## 2022-08-19 MED ORDER — CHLORHEXIDINE GLUCONATE 0.12 % MT SOLN
15.0000 mL | Freq: Once | OROMUCOSAL | Status: AC
  Administered 2022-08-19: 15 mL via OROMUCOSAL

## 2022-08-19 MED ORDER — EPHEDRINE SULFATE (PRESSORS) 50 MG/ML IJ SOLN
INTRAMUSCULAR | Status: DC | PRN
  Administered 2022-08-19 (×2): 10 mg via INTRAVENOUS

## 2022-08-19 MED ORDER — FENTANYL CITRATE (PF) 100 MCG/2ML IJ SOLN
INTRAMUSCULAR | Status: AC
  Filled 2022-08-19: qty 2

## 2022-08-19 MED ORDER — OXYCODONE HCL 5 MG PO TABS: 5.0000 mg | ORAL_TABLET | Freq: Once | ORAL | Status: DC | PRN

## 2022-08-19 MED ORDER — EPHEDRINE 5 MG/ML INJ
INTRAVENOUS | Status: AC
  Filled 2022-08-19: qty 5

## 2022-08-19 MED ORDER — DOCUSATE SODIUM 100 MG PO CAPS: 100.0000 mg | ORAL_CAPSULE | Freq: Every day | ORAL | 0 refills | Status: DC | PRN

## 2022-08-19 MED ORDER — ONDANSETRON HCL 4 MG/2ML IJ SOLN
INTRAMUSCULAR | Status: DC | PRN
  Administered 2022-08-19: 4 mg via INTRAVENOUS

## 2022-08-19 MED ORDER — OXYCODONE-ACETAMINOPHEN 5-325 MG PO TABS: 1.0000 | ORAL_TABLET | ORAL | 0 refills | Status: DC | PRN

## 2022-08-19 MED ORDER — IOHEXOL 300 MG/ML  SOLN
INTRAMUSCULAR | Status: DC | PRN
  Administered 2022-08-19: 50 mL via URETHRAL

## 2022-08-19 MED ORDER — ONDANSETRON HCL 4 MG/2ML IJ SOLN: 4.0000 mg | Freq: Once | INTRAMUSCULAR | Status: DC | PRN

## 2022-08-19 MED ORDER — PHENYLEPHRINE HCL (PRESSORS) 10 MG/ML IV SOLN
INTRAVENOUS | Status: DC | PRN
  Administered 2022-08-19 (×4): 80 ug via INTRAVENOUS

## 2022-08-19 MED ORDER — PROPOFOL 10 MG/ML IV BOLUS
INTRAVENOUS | Status: AC
  Filled 2022-08-19: qty 20

## 2022-08-19 MED ORDER — FENTANYL CITRATE (PF) 100 MCG/2ML IJ SOLN
INTRAMUSCULAR | Status: DC | PRN
  Administered 2022-08-19 (×4): 25 ug via INTRAVENOUS
  Administered 2022-08-19: 50 ug via INTRAVENOUS
  Administered 2022-08-19 (×2): 25 ug via INTRAVENOUS

## 2022-08-19 MED ORDER — OXYCODONE HCL 5 MG/5ML PO SOLN: 5.0000 mg | Freq: Once | ORAL | Status: DC | PRN

## 2022-08-19 MED ORDER — LACTATED RINGERS IV SOLN: INTRAVENOUS | Status: DC

## 2022-08-19 SURGICAL SUPPLY — 35 items
APL SKNCLS STERI-STRIP NONHPOA (GAUZE/BANDAGES/DRESSINGS)
BAG DRN RND TRDRP ANRFLXCHMBR (UROLOGICAL SUPPLIES) ×2
BAG URINE DRAIN 2000ML AR STRL (UROLOGICAL SUPPLIES) IMPLANT
BAG URO CATCHER STRL LF (MISCELLANEOUS) ×2 IMPLANT
BASKET ZERO TIP NITINOL 2.4FR (BASKET) IMPLANT
BENZOIN TINCTURE PRP APPL 2/3 (GAUZE/BANDAGES/DRESSINGS) IMPLANT
BSKT STON RTRVL ZERO TP 2.4FR (BASKET)
CATH FOLEY 2W COUNCIL 5CC 18FR (CATHETERS) IMPLANT
CATH SET URETHRAL DILATOR (CATHETERS) IMPLANT
CATH URETERAL DUAL LUMEN 10F (MISCELLANEOUS) IMPLANT
CATH URETL OPEN 5X70 (CATHETERS) ×2 IMPLANT
CLOTH BEACON ORANGE TIMEOUT ST (SAFETY) ×2 IMPLANT
DRSG TEGADERM 2-3/8X2-3/4 SM (GAUZE/BANDAGES/DRESSINGS) IMPLANT
FIBER LASER MOSES 200 DFL (Laser) IMPLANT
GLOVE BIO SURGEON STRL SZ7 (GLOVE) ×2 IMPLANT
GLOVE BIOGEL M 7.0 STRL (GLOVE) ×2 IMPLANT
GOWN STRL REUS W/ TWL XL LVL3 (GOWN DISPOSABLE) ×2 IMPLANT
GOWN STRL REUS W/TWL XL LVL3 (GOWN DISPOSABLE) ×2
GUIDEWIRE STR DUAL SENSOR (WIRE) ×4 IMPLANT
GUIDEWIRE ZIPWRE .038 STRAIGHT (WIRE) IMPLANT
KIT TURNOVER KIT A (KITS) IMPLANT
LASER FIB FLEXIVA PULSE ID 365 (Laser) IMPLANT
LASER FIB FLEXIVA PULSE ID 550 (Laser) IMPLANT
LASER FIB FLEXIVA PULSE ID 910 (Laser) IMPLANT
LOOP CUT BIPOLAR 24F LRG (ELECTROSURGICAL) IMPLANT
MANIFOLD NEPTUNE II (INSTRUMENTS) ×2 IMPLANT
PACK CYSTO (CUSTOM PROCEDURE TRAY) ×2 IMPLANT
SHEATH NAVIGATOR HD 12/14X46 (SHEATH) IMPLANT
STENT URET 6FRX26 CONTOUR (STENTS) IMPLANT
SYR TOOMEY IRRIG 70ML (MISCELLANEOUS)
SYRINGE TOOMEY IRRIG 70ML (MISCELLANEOUS) IMPLANT
TRACTIP FLEXIVA PULS ID 200XHI (Laser) IMPLANT
TRACTIP FLEXIVA PULSE ID 200 (Laser)
TUBING CONNECTING 10 (TUBING) ×2 IMPLANT
TUBING UROLOGY SET (TUBING) ×2 IMPLANT

## 2022-08-19 NOTE — H&P (Signed)
Office Visit Report     08/03/2022     CC/HPI: [name] is an 87 year old male seen in follow-up for history of elevated PSA, urolithiasis, bladder stones and gross hematuria.   1. Elevated PSA:  He previously had a history of elevated PSA in 02/2018 at 6.0. This declined in his last PSA on 06/27/2018 was 3.9. Given his age, he elects to forego additional PSA screening which I think is very reasonable. He denies bone pain or unexpected weight loss. He has stable appetite.  -He denies significantly bothersome voiding symptoms. IPSS is 3, QOL 0.   #2. Urolithiasis:  -He has a long history of urolithiasis. He underwent lithotripsy of left-sided UPJ stone in 2009. CT scan at that time revealed bilateral stones. On 02/2010, he underwent lithotripsy of a right-sided renal pelvic stone. He had lithotripsy on 09/2010 for a right ureteral stone. He passed 2 separate stones spontaneously in 05/2013 and 07/2013. He underwent ureteroscopy with laser lithotripsy of a right ureteral stone on 03/2015. He underwent left PCNL in 05/2015.  -CT A/P 01/2022 demonstrated distal right ureteral stone measuring 7 mm with no hydroureter. He also has multiple bilateral renal stones with dominant conglomerate in the left renal pelvis measuring 1.6 cm as well as bladder stones.  -After considering options, he initially elected for cystolitholopaxy and right ureteroscopy and preferred to not undergo an additional PCNL on his left. He then called and canceled his surgery in April 2023 as he was concerned about an aortic aneurysm.  -He presents to the office in 07/2022 with complaints of intermittent right-sided groin discomfort. He denies fevers, chills, dysuria. He denies gross hematuria.  -CT A/P 08/03/2022 with right hydronephrosis to level of bladder with several bladder stones seen and a 1cm stone seen at region of right UVJ. Also present is a large 1.7cm left UPJ stone.   He denies abdominal pain or flank pain. He denies  fevers or chills.   #3. Bladder stones: Bladder stones were seen on CT A/P 01/18/2022 . He denies bothersome lower urinary tract symptoms. In 08/03/2022 again, he has several large bladder stones including a stone near the level of the right UVJ.   #4Johney Maine hematuria: Negative evaluation other than stones as above in 01/2022.  -CT A/P 01/2022 with bilateral renal stones, distal right ureteral stone, bladder stones.  -Cystoscopy 01/2022 with no suspicious bladder lesions. He does have bilobar obstructing prostate and at least 2 separate bladder stones in his bladder.   #5. Partial left UPJ obstruction: Renal scan 09/2013 demonstrated 43% function of the left kidney and 57% function on the right kidney with partial obstruction of the left. He declined pyeloplasty at that time. He remains asymptomatic.   Patient currently denies fever, chills, sweats, nausea, vomiting, abdominal or flank pain, gross hematuria or dysuria.     ALLERGIES: No Allergies    MEDICATIONS: Lisinopril  Albuterol Sulfate  Fish Oil  Ibuprofen 200 mg capsule  Low Dose Aspirin Ec 81 mg tablet, delayed release  Montelukast Sodium 10 mg tablet  Multivitamin  Travoprost 0.004 % drops     GU PSH: Cysto Uretero Lithotripsy - 2016 Cystoscopy Insert Stent - 2016, 2009 Cystoscopy Ureteroscopy - 2009 ESWL - 2011, 2011, 2009, 2009 Locm 300-399Mg /Ml Iodine,1Ml - 01/18/2022 Percut Stone Removal >2cm - 2016 Remove Stent Via Transureth - 2009       PSH Notes: Percutaneous Lithotomy For Stone Over 2cm., Cystoscopy With Insertion Of Ureteral Stent Right, Cystoscopy With Ureteroscopy With Lithotripsy, Lithotripsy, Lithotripsy,  Transurethral Removal Of Internally Dwelling Ureteral Stent, Lithotripsy, Cystoscopy With Ureteroscopy Left, Cystoscopy With Insertion Of Ureteral Stent Left, Cataract Surgery, Inguinal Hernia Repair, Lithotripsy   NON-GU PSH: No Non-GU PSH    GU PMH: Bladder Stone - 02/09/2022 Gross hematuria - 02/09/2022, -  01/18/2022, - 01/05/2022 Renal calculus - 02/09/2022, - 01/05/2022, - 2022 (Worsening), He has bilateral renal calculi and has had some increase in size of his stones. He was taking in dap him I would. I recommended he re-initiate therapy and have sent in a prescription., - 2019 (Stable), It appears as renal calculi are stable but he has passed a few stones that had been small. What I recommended was we restart indapamide and then have him return again in a year for KUB and sooner if needed., - 2018, Bilateral kidney stones, - 2016 Ureteral calculus (Stable) - 02/09/2022, Ureteral Stone, - 2014 Elevated PSA - 2022, (Improving), He had a transient elevation of his PSA which returned to normal and I noted no worrisome findings on DRE. He will return again in 1 year for repeat DRE., - 2020, I noted no worrisome findings on his DRE but his PSA has increased significantly so we discussed repeating that today and if it remains elevated he will need to consider further evaluation., - 2019 Renal cyst, Bilateral renal cysts - 2016 Ureteral stricture, UPJ (ureteropelvic junction) obstruction - 2016      PMH Notes: History of bilateral renal calculi: He underwent lithotripsy of a left sided UPJ stone in 8/09. A CT scan at that time had revealed stones in both kidneys (Right: 6 stones - 5 mm and less, Left: 5 stones - 4 mm and less). On 02/23/10 he underwent lithotripsy of a right renal pelvic stone. A stone in the lower pole of his left kidney was in the shock path and fragmented as well with complete clearance of the renal pelvic stone on followup KUB. He then developed a right ureteral stone which required lithotripsy on 09/28/10. He passed 2 stones spontaneously in 7/14 and another stone in 9/14.  A CT scan done on 09/17/13 revealed 4 stones in the right kidney all peripherally located without any hydronephrosis. On the left-hand side he has a stone in the upper pole but is punctate but he also appears to have 2 stones in  the renal pelvis with Hounsfield units of ~800 the largest measuring about 6 mm. He also appears to possibly have some dilatation of the renal pelvis on the left-hand side suggesting mild UPJ obstruction. He seems to pass all his stones from the left-hand side.  Ureteroscopy and laser lithotripsy of right ureteral stone on 03/24/15  Left PCNL 05/30/15: 2 large stones in his renal pelvis were removed however the access by IR was through a medial portion of the renal pelvis and therefore the 2 peripherally located lower pole stones could not be accessed.  Stone analysis: Calcium oxalate 3.  Treatment: Indapamide 2.5 mg and low-sodium diet    Partial left UPJ obstruction: Renogram 11/14 - 43% function on the left hand side is 57% on the right with partial obstruction on the left. We discussed treatment with pyeloplasty however the patient did not want to undergo any form of surgery as he was asymptomatic and has maintained normal renal parenchyma without atrophy.     NON-GU PMH: Encounter for general adult medical examination without abnormal findings, Encounter for preventive health examination - 2016 Asthma, Asthma - 2014 Personal history of other diseases of the circulatory  system, History of hypertension - 2014    FAMILY HISTORY: Congestive Heart Failure - Runs In Family Family Health Status Number - Runs In Family   SOCIAL HISTORY: Marital Status: Married Preferred Language: English; Ethnicity: Not Hispanic Or Latino; Race: Black or African American     Notes: Never smoker, Previous History Of Smoking, Caffeine Use, Tobacco Use, Marital History - Currently Married   REVIEW OF SYSTEMS:    GU Review Male:   Patient denies frequent urination, hard to postpone urination, burning/ pain with urination, get up at night to urinate, leakage of urine, stream starts and stops, trouble starting your stream, have to strain to urinate , erection problems, and penile pain.  Gastrointestinal (Upper):    Patient denies nausea, vomiting, and indigestion/ heartburn.  Gastrointestinal (Lower):   Patient denies constipation and diarrhea.  Constitutional:   Patient denies fever, night sweats, weight loss, and fatigue.  Skin:   Patient denies skin rash/ lesion and itching.  Eyes:   Patient denies blurred vision and double vision.  Ears/ Nose/ Throat:   Patient denies sore throat and sinus problems.  Hematologic/Lymphatic:   Patient denies swollen glands and easy bruising.  Cardiovascular:   Patient denies leg swelling and chest pains.  Respiratory:   Patient denies cough and shortness of breath.  Endocrine:   Patient denies excessive thirst.  Musculoskeletal:   Patient denies back pain and joint pain.  Neurological:   Patient denies headaches and dizziness.  Psychologic:   Patient denies depression and anxiety.   VITAL SIGNS: None   MULTI-SYSTEM PHYSICAL EXAMINATION:    Constitutional: Well-nourished. No physical deformities. Normally developed. Good grooming.  Respiratory: No labored breathing, no use of accessory muscles.   Cardiovascular: Normal temperature, normal extremity pulses, no swelling, no varicosities.  Gastrointestinal: No mass, no tenderness, no rigidity, non obese abdomen. No CVA tenderness     Complexity of Data:  Source Of History:  Patient, Medical Record Summary  Records Review:   Previous Doctor Records  Urine Test Review:   Urinalysis  X-Ray Review: C.T. Abdomen/Pelvis: Reviewed Films. Reviewed Report. Discussed With Patient.     06/27/18 03/27/18 03/13/18 12/06/16 09/10/15  PSA  Total PSA 3.94 ng/mL 4.59 ng/mL 6.80 ng/dl 2.80 ng/dl 0.34 ng/dl  Free PSA  9.17 ng/mL     % Free PSA  13 % PSA      Notes:                     CLINICAL DATA: Suprapubic pain. Nephrolithiasis.   EXAM:  CT ABDOMEN AND PELVIS WITHOUT CONTRAST   TECHNIQUE:  Multidetector CT imaging of the abdomen and pelvis was performed  following the standard protocol without IV contrast.    RADIATION DOSE REDUCTION: This exam was performed according to the  departmental dose-optimization program which includes automated  exposure control, adjustment of the mA and/or kV according to  patient size and/or use of iterative reconstruction technique.   COMPARISON: 01/18/2022   FINDINGS:  Lower chest: No acute findings.   Hepatobiliary: Several fluid attenuation cysts in both the right and  left hepatic lobes are stable. Other tiny sub-cm low-attenuation  lesions are too small to characterize, but also remain stable. No  new or enlarging liver lesions are seen on this exam. A tiny less  than 1 cm calcified gallstone is noted, however there is no evidence  of cholecystitis or biliary ductal dilatation.   Pancreas: No mass or inflammatory process visualized on this  unenhanced exam.  Spleen: Within normal limits in size.   Adrenals/Urinary tract: Multiple renal calculi are again seen  bilaterally. New severe right hydroureteronephrosis is seen to the  level of the urinary bladder. Several bladder calculi are again  seen, largest measuring 10 mm in the region of the right UVJ.  Moderate left renal pelvicaliectasis is stable with multiple calculi  in the left renal collecting system measuring up to 1.7 cm. No  evidence of left ureteral dilatation or calculi.   Mild diffuse bladder wall thickening is seen, likely due to chronic  bladder outlet obstruction given enlarged prostate.   Stomach/Bowel: No evidence of obstruction, inflammatory process, or  abnormal fluid collections.   Vascular/Lymphatic: No pathologically enlarged lymph nodes  identified. No evidence of abdominal aortic aneurysm. Aortic  atherosclerotic calcification incidentally noted.   Reproductive: Stable mildly enlarged prostate.   Other: None.   Musculoskeletal: No suspicious bone lesions identified.   IMPRESSION:  New severe right hydroureteronephrosis to the level of the urinary  bladder.  Several bladder calculi again seen, largest measuring 10 mm  in the region of the right UVJ.   Stable moderate left renal pelvicaliectasis, with multiple calculi  in the left renal collecting system measuring up to 17 mm. Left UPJ  obstruction cannot be excluded.   Bilateral nephrolithiasis.   Stable mildly enlarged prostate and findings of chronic bladder  outlet obstruction.   Cholelithiasis. No radiographic evidence of cholecystitis.    Electronically Signed  By: Danae Orleans M.D.  On: 08/03/2022 16:41     PROCEDURES:         C.T. Urogram - [id number]      Patient confirmed No Neulasta OnPro Device.         Urinalysis w/Scope Dipstick Dipstick Cont'd Micro  Color: Yellow Bilirubin: Neg mg/dL WBC/hpf: 10 - 82/NFA  Appearance: Cloudy Ketones: Neg mg/dL RBC/hpf: 40 - 21/HYQ  Specific Gravity: 1.025 Blood: 3+ ery/uL Bacteria: Rare (0-9/hpf)  pH: 5.5 Protein: Trace mg/dL Cystals: NS (Not Seen)  Glucose: Neg mg/dL Urobilinogen: 0.2 mg/dL Casts: Hyaline    Nitrites: Neg Trichomonas: Not Present    Leukocyte Esterase: 2+ leu/uL Mucous: Present      Epithelial Cells: 20 - 40/hpf      Yeast: NS (Not Seen)      Sperm: Not Present    ASSESSMENT:      ICD-10 Details  1 GU:   Renal calculus - N20.0   2   Suprapubic pain - R39.82   3   Ureteral calculus - N20.1    PLAN:           Orders Labs CULTURE, URINE  X-Rays: C.T. Stone Protocol Without I.V. Contrast          Schedule         Document Letter(s):  Created for Patient: Clinical Summary         Notes:   #1. History of elevated PSA:  -PSA in 02/2018 was elevated at 6.0. This declined without intervention to 3.9 on 06/27/2018.  -Given his age, he elects to forego additional PSA screening which I think is very reasonable   #2. Urolithiasis:  -I reviewed CT A/P 07/2022 with right hydronephrosis to level of bladder with several bladder stones seen and a 1cm stone seen at region of right UVJ. Also present is a large 1.7cm  left UPJ stone.  -We discussed options and he would like to proceed with cystolitholapaxy, right ureteroscopy with treating his right ureteral stones. Surgery letter sent.  -  He does not have severe pain. No indication for urgent stent. However I did discuss strict return precautions including severe pain, fevers and chills.  -I discussed that in order to treat his left-sided renal stones, he would require PCNL. He prefers not to undergo PCNL. He understands risk of renal deterioration, infection, gross of stones and obstruction.   We discussed the options for management of kidney stones, including observation, ESWL, ureteroscopy with laser lithotripsy, and PCNL. The risks and benefits of each option were discussed.  For observation I described the risks which include but are not limited to silent renal damage, life-threatening infection, need for emergent surgery, failure to pass stone, and pain.   ESWL: risks and benefits of ESWL were outlined including infection, bleeding, pain, steinstrasse, kidney injury, need for ancillary treatments, and global anesthesia risks including but not limited to CVA, MI, DVT, PE, pneumonia, and death.   Ureteroscopy: risks and benefits of ureteroscopy were outlined, including infection, bleeding, pain, temporary ureteral stent and associated stent bother, ureteral injury, ureteral stricture, need for ancillary treatments, and global anesthesia risks including but not limited to CVA, MI, DVT, PE, pneumonia, and death.   PCNL: risks and benefits of PCNL were outlined including infection, bleeding, blood transfusion, pain, pneumothorax, bowel injury, persistent urine leak, positioning injury, inability to clear stone burden, renal laceration, arterial venous fistula or malformation, need for ancillary treatments, and global anesthesia risks including but not limited to CVA, MI, DVT, PE, pneumonia, and death.    #3. Bladder stones: I discussed options of cystolitholopaxy.  Discussed risk and benefits. He agrees to proceed. We will perform concomitantly with treatment of his ureteral stone as above.   #4Michaell Cowing hematuria: Negative evaluation other than stones as detailed above.   #5. Partial left UPJ obstruction: Renal scan in 2014 demonstrated 43% function on the left kidney and 57% function of the right kidney. He remains asymptomatic. He does not wish to undergo any intervention which I think is reasonable considering his age, rhytides.   CC: Betty Swazil[place], MD   Urology Preoperative H&P   Chief Complaint: bladder stones and right ureteral stone  History of Present Illness: [name] is a 87 y.o. male with bladder stones and right ureteral stone. Denies fevers, chills, dysuria. He completed a course of ciprofloxacin preoperatively.    Past Medical History:  Diagnosis Date   Allergy    SEASONAL   Arthritis    Asthma    BPH (benign prostatic hypertrophy)    CHF (congestive heart failure) (HCC)    GERD (gastroesophageal reflux disease)    Heart murmur    History of kidney stones    History of myasthenia gravis    15 yrs ago - no current problem or treatment   Hypertension    Mild carotid artery disease (HCC)    bilateral ICA 0-39%  per duplex 02-16-2013   Multiple pulmonary nodules    per last CT stable   Nephrolithiasis    BILATERAL   Thoracic aortic aneurysm (TAA) (HCC)     Past Surgical History:  Procedure Laterality Date   CATARACT EXTRACTION W/ INTRAOCULAR LENS  IMPLANT, BILATERAL  2007   COLONOSCOPY     COLONOSCOPY W/ POLYPECTOMY  05-14-2010   CYSTO/  LEFT RETROGRADE PYELOGRAM/  LEFT URETEROSCOPY/  LEFT STENT PLACEMENT  06-17-2008   CYSTOSCOPY W/ RETROGRADES Bilateral 03/24/2015   Procedure: CYSTOSCOPY WITH RETROGRADE PYELOGRAM;  Surgeon: Ihor Gully, MD;  Location: [facility] [place];  Service: Urology;  Laterality:  Bilateral;   CYSTOSCOPY WITH STENT PLACEMENT Right 03/24/2015   Procedure: CYSTOSCOPY WITH STENT  PLACEMENT;  Surgeon: Ihor GullyMark Ottelin, MD;  [facility] [place];  Service: Urology;  Laterality: Right;   CYSTOSCOPY WITH URETEROSCOPY Right 03/24/2015   Procedure: CYSTOSCOPY WITH URETEROSCOPY;  Surgeon: Ihor GullyMark Ottelin, MD;  [facility] [place];  Service: Urology;  Laterality: Right;   EXTRACORPOREAL SHOCK WAVE LITHOTRIPSY Bilateral left 02-23-2010 //   right 09-28-2010   HOLMIUM LASER APPLICATION Right 03/24/2015   Procedure: HOLMIUM LASER APPLICATION;  Surgeon: Ihor GullyMark Ottelin, MD;  [facility] [place];  Service: Urology;  Laterality: Right;   INGUINAL HERNIA REPAIR Bilateral 2002 approx.   NEPHROLITHOTOMY Left 05/30/2015   Procedure: LEFT PERCUTANEOUS NEPHROLITHOTOMY;  Surgeon: Ihor GullyMark Ottelin, MD;  Location: [facility];  Service: Urology;  Laterality: Left;   POLYPECTOMY     STONE EXTRACTION WITH BASKET Right 03/24/2015   Procedure: STONE EXTRACTION WITH BASKET;  Surgeon: Ihor GullyMark Ottelin, MD;  [facility] [place];  Service: Urology;  Laterality: Right;    Allergies:  Allergies  Allergen Reactions   Lisinopril Swelling    Family History  Problem Relation Age of Onset   Heart disease Mother    Hypertension Mother    Kidney disease Mother    Heart disease Father    Hypertension Father    Diabetes Sister    Diabetes Sister    Hypercalcemia Neg Hx     Social History:  reports that he has never smoked. He has never used smokeless tobacco. He reports that he does not drink alcohol and does not use drugs.  ROS: A complete review of systems was performed.  All systems are negative except for pertinent findings as noted.  Physical Exam:  Vital signs in last 24 hours:   Constitutional:  Alert and oriented, No acute distress Cardiovascular: Regular rate and rhythm Respiratory: Normal respiratory effort, Lungs clear bilaterally GI: Abdomen is soft, nontender, nondistended, no abdominal masses GU: No CVA tenderness Lymphatic: No  lymphadenopathy Neurologic: Grossly intact, no focal deficits Psychiatric: Normal mood and affect  Laboratory Data:  No results for input(s): "WBC", "HGB", "HCT", "PLT" in the last 72 hours.  No results for input(s): "NA", "K", "CL", "GLUCOSE", "BUN", "CALCIUM", "CREATININE" in the last 72 hours.  Invalid input(s): "CO3"   No results found for this or any previous visit (from the past 24 hour(s)). No results found for this or any previous visit (from the past 240 hour(s)).  Renal Function: No results for input(s): "CREATININE" in the last 168 hours. Estimated Creatinine Clearance: 43.9 mL/min (by C-G formula based on SCr of 1.09 mg/dL).  Radiologic Imaging: No results found.  I independently reviewed the above imaging studies.  Assessment and Plan [name] is a 87 y.o. male with bladder stones and right ureteral stone. Denies fevers, chills, dysuria. He completed a course of ciprofloxacin preoperatively.  -The risks, benefits and alternatives of cystoscopy with cystolitholapaxy, right URS/LL left J stent placement was discussed with the patient.  Risks include, but are not limited to: bleeding, urinary tract infection, ureteral injury, ureteral stricture disease, chronic pain, urinary symptoms, bladder injury, stent migration, the need for nephrostomy tube placement, MI, CVA, DVT, PE and the inherent risks with general anesthesia.  The patient voices understanding and wishes to proceed.       Brian R. Chamberlain Steinborn MD 08/19/2022, 8:44 AM  [facility] Pager: [phone]): [phone]

## 2022-08-19 NOTE — Op Note (Signed)
Operative Note  Preoperative diagnosis:  1.  Right renal stone 2. Right ureteral stone 3. Bladder stones  Postoperative diagnosis: 1.  Urethral stricture 2. Bladder stones 3. Right renal stone  Procedure(s): 1.  Cystoscopy 2. Urethral dilation 3. Right ureteroscopy with laser lithotripsy and basket extraction of stones 4. Right retrograde pyelogram 5. Right ureteral stent placement 7. Fluoroscopy with intraoperative interpretation 8. Cystolitholapaxy >2cm 9. Fulguration of prostate  Surgeon: Jettie Pagan, MD  Assistants:  None  Anesthesia:  General  Complications:  None  EBL:  Minimal  Specimens: 1. Stones for stone analysis (to be done at [facility])  Drains/Catheters: 1.  Right 6Fr x 26cm ureteral stent with a tether string tied to indwelling foley catheter 2. 18 Fr council foley catheter  Intraoperative findings:   Cystoscopy demonstrated 8 Fr soft bulbar urethral stricture <2cm. Bilobar obstructing prostate. Large stone adhered to left ureteral orifice however within the bladder. Large bladder stones, approximately 4 largest measuring about 2.5cm.  Right ureteroscopy demonstrated tortuous right ureter with chronic severe hydronephrosis to the level of the bladder however no ureteral stones. There were 2 right renal stones that were predominantly dusted and large fragments basket extracted. Stone free on the right at the end of the case/ Successful right ureteral stent placement.  Indication:  [name] is a 87 y.o. male with a history of bilateral renal stones, bladder stones who presents for right ureteroscopy laser lithotripsy and basket extraction of stones as well as cystolitholapaxy.  Description of procedure: After informed consent was obtained from the patient, the patient was identified and taken to the operating room and placed in the supine position.  General anesthesia was administered as well as perioperative IV antibiotics.  At the  beginning of the case, a time-out was performed to properly identify the patient, the surgery to be performed, and the surgical site.  Sequential compression devices were applied to the lower extremities at the beginning of the case for DVT prophylaxis.  The patient was then placed in the dorsal lithotomy supine position, prepped and draped in sterile fashion.  We then passed the 21-French rigid cystoscope through the urethra and identified an approximately 8 Fr soft bulbar urethral stricture. A 0.038 sensor wire was passed through the stricture into the bladder.  I then used Cook urethral dilators dilated from 10 [place] to 24 [place] without difficulty.  The stricture did appear quite soft.  The length was less than 2 cm.  I then navigate 21 French rigid cystoscope into the bladder and observed bilobar obstructing prostate.  He had at least 4 large bladder stones in the bladder largest measuring about 2.5 cm.  There was a large stone that was adhered to the right ureterovesical junction.  This was within the bladder rather than in the ureter.  However I do think this was causing obstruction of his right ureter.  I then was able to move this left side.  I then attempted to pass a wire into his right ureter however this coiled in the right distal ureter.  I then introduced a semirigid ureteroscope into the right distal ureter and encountered a tortuous ureter.  I was able to navigate the scope following the proximal ureter.  There is no areas of stricture.  The ureter was chronically dilated with tortuosity.  There were no stones in the ureter.  I was then able to pass a 0.038 sensor wire into the kidney.  I performed a right retrograde pyelogram through a dual-lumen catheter  demonstrating severe torturous right ureter and chronic hydronephrosis.  I then passed a separate 0.038 sensor wire into the kidney.  Over this wire, passed a dual lumen flexible ureteroscope.  This was navigated to the renal pelvis.  I  encountered 2 separate stones in this location.  These were predominantly dusted.  These were then fragmented and a large stones were basket extracted.  I did perform a repeat right retrograde pyelogram and surveyed the kidney x2 noting no other stones and no large stone fragments.  I then withdrew the scope down the ureter noting no trauma to the ureter and no other fragments in the ureter.  I left a wire in place.  I then introduced a laser bridge scope into the bladder.  I passed and a 900 m holmium laser fiber and proceeded to fragment his large bladder stone to small pieces.  I then introduced the resectoscope with a 3 French scope in order to irrigate away his bladder stones.  The bladder was then emptied there were no further stones.  There was some bleeding on his prostate.  I did fulgurate the prostate and ensured excellent hemostasis.  I passed a 6 [place] by 26 cm stent noted previously placed guidewire.  I did leave a tether string and tied this to a [address] [place] catheter.  I then navigated the catheter over the wire in the bladder.  The bladder was irrigated with return of clear yellow urine.  Balloon was inflated with 10 cc water.  Patient tolerated the procedure well there are no complications.  Plan:  Patient will be discharged home.  Follow-up in the office in 7 days for Foley catheter removal and right ureteral stent removal that is attached to Foley catheter.   [facility]  Pager: [phone]

## 2022-08-19 NOTE — Transfer of Care (Signed)
Immediate Anesthesia Transfer of Care Note  Patient: [name]  Procedure(s) Performed: Procedure(s) (LRB): CYSTOSCOPY/RETROGRADE/URETEROSCOPY/HOLMIUM LASER/STENT PLACEMENT,URETHRAL DILATION (Right) CYSTOSCOPY WITH LITHOLAPAXY,FULGERATION OF PROSTATE, BASKET EXTRACTION OF STONE (N/A)  Patient Location: PACU  Anesthesia Type: General  Level of Consciousness: awake, sedated, patient cooperative and responds to stimulation  Airway & Oxygen Therapy: Patient Spontanous Breathing and Patient connected to face mask oxygen  Post-op Assessment: Report given to PACU RN, Post -op Vital signs reviewed and stable and Patient moving all extremities  Post vital signs: Reviewed and stable  Complications: No apparent anesthesia complications

## 2022-08-19 NOTE — Anesthesia Procedure Notes (Signed)
Procedure Name: LMA Insertion Date/Time: 08/19/2022 11:31 AM  Performed by: Justice Rocher, CRNAPre-anesthesia Checklist: Patient identified, Emergency Drugs available, Suction available, Patient being monitored and Timeout performed Patient Re-evaluated:Patient Re-evaluated prior to induction Oxygen Delivery Method: Circle system utilized Preoxygenation: Pre-oxygenation with 100% oxygen Induction Type: IV induction Ventilation: Mask ventilation without difficulty LMA: LMA with gastric port inserted LMA Size: 5.0 Number of attempts: 1 Airway Equipment and Method: Bite block Placement Confirmation: positive ETCO2, breath sounds checked- equal and bilateral and CO2 detector Tube secured with: Tape Dental Injury: Teeth and Oropharynx as per pre-operative assessment

## 2022-08-19 NOTE — Anesthesia Postprocedure Evaluation (Signed)
Anesthesia Post Note  Patient: TAYVIAN HOLYCROSS  Procedure(s) Performed: CYSTOSCOPY/RETROGRADE/URETEROSCOPY/HOLMIUM LASER/STENT PLACEMENT,URETHRAL DILATION (Right: Ureter) CYSTOSCOPY WITH LITHOLAPAXY,FULGERATION OF PROSTATE, BASKET EXTRACTION OF STONE (Urethra)     Patient location during evaluation: PACU Anesthesia Type: General Level of consciousness: awake and alert Pain management: pain level controlled Vital Signs Assessment: post-procedure vital signs reviewed and stable Respiratory status: spontaneous breathing, nonlabored ventilation, respiratory function stable and patient connected to nasal cannula oxygen Cardiovascular status: blood pressure returned to baseline and stable Postop Assessment: no apparent nausea or vomiting Anesthetic complications: no   No notable events documented.  Last Vitals:  Vitals:   08/19/22 0951 08/19/22 1304  BP: (!) 162/95 (!) 167/84  Pulse: 67 63  Resp: 16 13  Temp: 36.8 C 36.6 C  SpO2: 100% 100%    Last Pain:  Vitals:   08/19/22 1304  TempSrc:   PainSc: Asleep                 Ausencio Vaden S

## 2022-08-19 NOTE — Discharge Instructions (Signed)
Follow-up in the office in 7 days for Foley catheter removal and right ureteral stent removal that is attached to Foley catheter.

## 2022-08-20 ENCOUNTER — Encounter (HOSPITAL_COMMUNITY): Payer: Self-pay | Admitting: Urology

## 2022-08-26 DIAGNOSIS — N202 Calculus of kidney with calculus of ureter: Secondary | ICD-10-CM | POA: Diagnosis not present

## 2022-08-26 DIAGNOSIS — N35011 Post-traumatic bulbous urethral stricture: Secondary | ICD-10-CM | POA: Diagnosis not present

## 2022-08-26 DIAGNOSIS — N210 Calculus in bladder: Secondary | ICD-10-CM | POA: Diagnosis not present

## 2022-08-27 ENCOUNTER — Telehealth: Payer: Self-pay | Admitting: Pharmacist

## 2022-08-27 NOTE — Chronic Care Management (AMB) (Signed)
    Chronic Care Management Pharmacy Assistant   Name: [name]  MRN: [id number] DOB: Apr 10, 1935  08/30/2022 [place], No answer, left message of appointment on 08/30/2022 at 11:00 via telephone visit with Jeni Salles, Pharm D. Notified to have all medications, supplements, blood pressure and/or blood sugar logs available during appointment and to return call if need to reschedule.  Care Gaps: AWV - scheduled 08/23/2023 Last BP - 132/68 on 08/06/2022  Star Rating Drug: Losartan 50 mg - last filled 05/11/2022 90 DS at [facility] verified with Vicente Males Rosuvastatin 10 mg - last filled 05/26/2022 90 DS at [facility]  Any gaps in medications fill history? Yes  [place] Pharmacist Assistant [phone]

## 2022-08-30 ENCOUNTER — Ambulatory Visit (INDEPENDENT_AMBULATORY_CARE_PROVIDER_SITE_OTHER): Payer: Medicare Other | Admitting: Pharmacist

## 2022-08-30 VITALS — BP 142/78

## 2022-08-30 DIAGNOSIS — I10 Essential (primary) hypertension: Secondary | ICD-10-CM

## 2022-08-30 DIAGNOSIS — E785 Hyperlipidemia, unspecified: Secondary | ICD-10-CM

## 2022-08-30 NOTE — Progress Notes (Signed)
Chronic Care Management Pharmacy Note  08/30/2022 Name:  [name] MRN:  [id number] DOB:  08-09-1935  Summary: LDL not at goal < 70 BP at goal < 140/90 per home readings Pt has an abundance of medications  Recommendations/Changes made from today's visit: -Recommend repeat lipid panel and consider increasing rosuvastatin if LDL is above goal < 70 -Recommended starting in January to use all of his medications before getting refills through mail order  Plan: BP assessment in 6 months Follow up in 1 year  Subjective: [name] is an 87 y.o. year old male who is a primary patient of Galvan, [name], MD.  The CCM team was consulted for assistance with disease management and care coordination needs.    Engaged with patient by telephone for follow up visit in response to provider referral for pharmacy case management and/or care coordination services.   Consent to Services:  The patient was given information about Chronic Care Management services, agreed to services, and gave verbal consent prior to initiation of services.  Please see initial visit note for detailed documentation.   Patient Care Team: Galvan, Brian G, MD as PCP - General (Family Medicine) [name] Crochet, MD as PCP - Cardiology (Cardiology) [name], [facility] as Pharmacist (Pharmacist)  Recent office visits: 08/18/22 Brian Rakes, LPN: Patient presented for AWV.  08/06/22 Brian [name], MD: Patient presented for HTN follow up. Influenza vaccine administered. Follow up in 6 months for CPE.  Recent consult visits: 03/18/22 Brian Skillern, PA-C - Patient presented for Aneurysm of ascending aorta without rupture. No medication changes.   03/11/22 Patient presented to [facility] for Pre Admission Testing.   03/03/22 Galvan, Brian Lin, PA (Cardiology) - Patient presented via Tele-health for Pre -Op visit. Advised to hold Asprin 5-7 days before procedure.  Hospital visits: 08/19/22  Patient admitted to [facility] for 5 hours for cystoscopy and stent placement for kidney stones.  9/17-9/18/23 Patient admitted to [facility] ED for urolithiasis.    Objective:  Lab Results  Component Value Date   CREATININE 1.09 08/01/2022   BUN 13 08/01/2022   GFR 67.56 02/02/2022   GFRNONAA >60 08/01/2022   GFRAA 73 08/01/2020   NA 142 08/01/2022   K 4.7 08/06/2022   CALCIUM 10.5 (H) 08/01/2022   CO2 25 08/01/2022   GLUCOSE 138 (H) 08/01/2022    Lab Results  Component Value Date/Time   GFR 67.56 02/02/2022 08:42 AM   GFR 60.60 08/04/2021 11:26 AM    Last diabetic Eye exam: No results found for: "HMDIABEYEEXA"  Last diabetic Foot exam: No results found for: "HMDIABFOOTEX"   Lab Results  Component Value Date   CHOL 167 02/02/2022   HDL 51.80 02/02/2022   LDLCALC 97 02/02/2022   LDLDIRECT 140.4 05/10/2007   TRIG 87.0 02/02/2022   CHOLHDL 3 02/02/2022       Latest Ref Rng & Units 02/02/2022    8:42 AM 08/04/2021   11:26 AM 08/01/2020    8:58 AM  Hepatic Function  Total Protein 6.0 - 8.3 Galvan/dL 7.7  7.4  7.1   Albumin 3.5 - 5.2 Galvan/dL 4.5  4.2    AST 0 - 37 U/L 36  34  24   ALT 0 - 53 U/L 26  23  17    Alk Phosphatase 39 - 117 U/L 94  74    Total Bilirubin 0.2 - 1.2 mg/dL 0.5  0.6  0.3   Bilirubin, Direct  0.0 - 0.3 mg/dL 0.1       Lab Results  Component Value Date/Time   TSH 1.22 08/04/2021 11:26 AM   TSH 2.08 12/05/2017 11:42 AM       Latest Ref Rng & Units 08/01/2022   11:28 PM 08/04/2021   11:26 AM 06/19/2019    9:27 AM  CBC  WBC 4.0 - 10.5 K/uL 11.3  6.3  6.7   Hemoglobin 13.0 - 17.0 Galvan/dL 14.7  13.5  14.0   Hematocrit 39.0 - 52.0 % 44.4  41.0  41.3   Platelets 150 - 400 K/uL 204  208.0  204.0     Lab Results  Component Value Date/Time   VD25OH 39.56 02/02/2022 08:42 AM   VD25OH 37.05 12/30/2020 12:20 PM    Clinical ASCVD: Yes  The ASCVD Risk score (Arnett DK, et al., 2019) failed to calculate for the following  reasons:   The 2019 ASCVD risk score is only valid for ages 30 to 13       08/18/2022    8:29 AM 08/06/2022    8:42 AM 02/02/2022    7:25 PM  Depression screen PHQ 2/9  Decreased Interest 0 0 0  Down, Depressed, Hopeless 0 0 0  PHQ - 2 Score 0 0 0  Altered sleeping 0 0   Tired, decreased energy 0 1   Change in appetite 0 0   Feeling bad or failure about yourself  0 0   Trouble concentrating 0 0   Moving slowly or fidgety/restless 0 0   Suicidal thoughts 0 0   PHQ-9 Score 0 1   Difficult doing work/chores Not difficult at all Not difficult at all      Social History   Tobacco Use  Smoking Status Never  Smokeless Tobacco Never   BP Readings from Last 3 Encounters:  08/30/22 (!) 142/78  08/19/22 (!) 142/75  08/18/22 120/64   Pulse Readings from Last 3 Encounters:  08/19/22 60  08/18/22 62  08/13/22 72   Wt Readings from Last 3 Encounters:  08/18/22 140 lb 8 oz (63.7 kg)  08/13/22 146 lb (66.2 kg)  08/06/22 149 lb 2 oz (67.6 kg)   BMI Readings from Last 3 Encounters:  08/18/22 23.38 kg/m  08/13/22 24.30 kg/m  08/06/22 24.82 kg/m    Assessment/Interventions: Review of patient past medical history, allergies, medications, health status, including review of consultants reports, laboratory and other test data, was performed as part of comprehensive evaluation and provision of chronic care management services.   SDOH:  (Social Determinants of Health) assessments and interventions performed: Yes SDOH Interventions    Flowsheet Row Chronic Care Management from 08/30/2022 in [place] at [place] from 08/18/2022 in [place] at [place] Management from 06/11/2021 in [place] at [place] Interventions -- Intervention Not Indicated --  Housing Interventions -- Intervention Not Indicated --  Transportation Interventions -- Intervention Not Indicated Intervention Not  Indicated  Utilities Interventions -- Intervention Not Indicated --  Alcohol Usage Interventions -- Intervention Not Indicated (Score <7) --  Financial Strain Interventions Intervention Not Indicated Intervention Not Indicated Intervention Not Indicated  Physical Activity Interventions -- Intervention Not Indicated --  Stress Interventions -- Intervention Not Indicated --  Social Connections Interventions -- Intervention Not Indicated --       SDOH Screenings   Food Insecurity: No Food Insecurity (08/18/2022)  Housing: Low Risk  (08/18/2022)  Transportation Needs: No Transportation  Needs (08/18/2022)  Utilities: Not At Risk (08/18/2022)  Alcohol Screen: Low Risk  (08/18/2022)  Depression (PHQ2-9): Low Risk  (08/18/2022)  Financial Resource Strain: Low Risk  (08/30/2022)  Physical Activity: Inactive (08/18/2022)  Social Connections: Moderately Isolated (08/18/2022)  Stress: No Stress Concern Present (08/18/2022)  Tobacco Use: Low Risk  (08/20/2022)   CCM Care Plan  Allergies  Allergen Reactions   Lisinopril Swelling    Medications Reviewed Today     Reviewed by [name], [facility] (Pharmacist) on 08/30/22 at 1116  Med List Status: <None>   Medication Order Taking? Sig Documenting Provider Last Dose Status Informant  albuterol (VENTOLIN HFA) 108 (90 Base) MCG/ACT inhaler [id number]  Inhale 2 puffs into the lungs every 6 (six) hours as needed for wheezing or shortness of breath. Galvan, Brian G, MD  Active Self  amLODipine (NORVASC) 5 MG tablet [id number]  TAKE 1 TABLET BY MOUTH AT BEDTIME Galvan, Brian G, MD  Active Self  aspirin 81 MG EC tablet [id number]  Take 81 mg by mouth every morning. [provider]  Active Self  calcium carbonate (TUMS - DOSED IN MG ELEMENTAL CALCIUM) 500 MG chewable tablet [id number]  Chew 1 tablet by mouth daily as needed for indigestion or heartburn. Reported on 11/19/2015 [provider]  Active Self    Discontinued 08/30/22 1113 (Completed  Course)          Med Note Lenor Derrick Aug 09, 2022 10:00 AM) Started 08/06/22    Discontinued 08/30/22 1115 (Completed Course)   FLOVENT HFA 44 MCG/ACT inhaler [id number]  INHALE 1 PUFF BY MOUTH INTO THE LUNGS TWICE DAILY  Patient taking differently: Inhale 1 puff into the lungs 2 (two) times daily.   Galvan, Brian G, MD  Active Self  losartan (COZAAR) 50 MG tablet [id number] Yes TAKE 1 TABLET BY MOUTH DAILY Galvan, Brian G, MD Taking Active Self  montelukast (SINGULAIR) 10 MG tablet [id number]  TAKE 1 TABLET BY MOUTH EVERY NIGHT AT BEDTIME. GENERIC EQUIVALENT FOR SINGULAIR Galvan, Brian G, MD  Active Self  Multiple Vitamin (MULTIVITAMIN) tablet [id number]  Take 1 tablet by mouth every morning. [provider]  Active Self    Discontinued 08/30/22 1115 (Completed Course)   Discontinued 08/30/22 1114 (Completed Course)   polyvinyl alcohol (LIQUIFILM TEARS) 1.4 % ophthalmic solution [id number]  Place 1 drop into both eyes daily as needed for dry eyes. Reported on 11/19/2015 [provider]  Active Self   Patient not taking:   Discontinued 08/30/22 1114 (Completed Course) rosuvastatin (CRESTOR) 10 MG tablet [id number]  TAKE 1 TABLET BY MOUTH DAILY Galvan, Brian G, MD  Active Self  Travoprost, BAK Free, (TRAVATAN) 0.004 % SOLN ophthalmic solution [id number]  Place 1 drop into both eyes at bedtime. [provider]  Active Self            Patient Active Problem List   Diagnosis Date Noted   Vitamin D deficiency, unspecified 12/30/2020   Thoracic aortic aneurysm ([place]) 08/01/2020   (HFpEF) heart failure with preserved ejection fraction ([place]) 08/01/2020   Heart murmur 01/28/2020   Elevated PSA 01/28/2020   Hx of angioedema 11/26/2019   Aortic atherosclerosis ([place]) 11/26/2019   Dyslipidemia (high LDL; low HDL) 06/18/2019   Routine general medical examination at a health care facility 12/05/2017   Primary hyperparathyroidism ([place]) 11/26/2015   Hypercalcemia  11/19/2015   Family history of prostate cancer 09/10/2015   Recurrent kidney stones 03/18/2015   History of multiple pulmonary nodules 04/10/2014  ERECTILE DYSFUNCTION, ORGANIC 05/16/2009   Osteoarthritis, multiple sites 11/20/2008   MIGRAINE, OPHTHALMIC 03/21/2007   Essential hypertension 03/21/2007   Allergic rhinitis 03/21/2007   Asthma 03/21/2007   GERD 03/21/2007   HERNIA 03/21/2007    Immunization History  Administered Date(s) Administered   Fluad Quad(high Dose 65+) 10/02/2019, 08/01/2020, 08/04/2021, 08/06/2022   Influenza Whole 10/14/2004, 08/18/2007, 08/28/2008, 09/08/2009, 08/20/2010   Influenza, High Dose Seasonal PF 08/27/2013, 09/10/2015, 12/06/2016, 08/25/2017, 09/18/2018   Influenza,inj,Quad PF,6+ Mos 08/29/2014   PFIZER(Purple Top)SARS-COV-2 Vaccination 01/19/2020, 02/19/2020, 08/21/2020, 04/19/2021   Pneumococcal Conjugate-13 08/29/2014   Pneumococcal Polysaccharide-23 02/18/2005   Td 11/15/1998, 05/13/2008   Zoster, Live 06/03/2009   Patient reports he had the procedure to blast the kidney stones and had very minimal pain. He only took APAP for pain and did not need the oxycodone at all.  Patient reported he thinks he is taking the rosuvastatin but was unsure with initial questioning. Patient reports the mail order pharmacy gets him backed up with medications and he has a lot of abundance.  Conditions to be addressed/monitored:  Hypertension, Hyperlipidemia, GERD, Asthma, and Allergic Rhinitis  Conditions addressed this visit: Hypertension, Hyperlipidemia  Care Plan : [place]  Updates made by [name], [place] since 08/30/2022 12:00 AM     Problem: Problem: Hypertension, Hyperlipidemia, GERD, Asthma, and Allergic Rhinitis      Long-Range Goal: Patient-Specific Goal   Start Date: 06/11/2021  Expected End Date: 06/11/2022  Recent Progress: On track  Priority: High  Note:   Current Barriers:  Unable to maintain control of blood  pressure Suboptimal therapeutic regimen for cholesterol  Pharmacist Clinical Goal(s):  Patient will maintain control of blood pressure as evidenced by home and office readings  adhere to plan to optimize therapeutic regimen for cholesterol as evidenced by report of adherence to recommended medication management changes through collaboration with PharmD and provider.   Interventions: 1:1 collaboration with Galvan, Brian G, MD regarding development and update of comprehensive plan of care as evidenced by provider attestation and co-signature Inter-disciplinary care team collaboration (see longitudinal plan of care) Comprehensive medication review performed; medication list updated in electronic medical record  Hypertension (BP goal <140/90) -Controlled -Current treatment: Losartan 50 mg 1 tablet daily - Appropriate, Effective, Safe, Accessible Amlodipine 5 mg 1 tablet daily - Appropriate, Effective, Safe, Accessible -Medications previously tried: amlodipine (blurry vision), lisinopril-HCTZ   -Current home readings:  lowest 116; usually 130-145/65-74; checking 2-3 times a week and has brought arm cuff into office before -Current dietary habits: eating out more often; not limiting salt much; has switched to less salty foods; does not read package labels -Current exercise habits: more active with home repairs -Denies hypotensive/hypertensive symptoms -Educated on BP goals and benefits of medications for prevention of heart attack, stroke and kidney damage; Exercise goal of 150 minutes per week; Importance of home blood pressure monitoring; Proper BP monitoring technique; -Counseled to monitor BP at home daily, document, and provide log at future appointments -Counseled on diet and exercise extensively Recommended to continue current medication  Hyperlipidemia: (LDL goal < 70) -Uncontrolled -Current treatment: Rosuvastatin 10 mg 1 tablet daily - Appropriate, Query effective, Safe,  Accessible -Medications previously tried: fish oil (not needed) -Current dietary patterns: limiting fried foods -Current exercise habits: active around the house -Educated on Cholesterol goals;  Benefits of statin for ASCVD risk reduction; Importance of limiting foods high in cholesterol; Exercise goal of 150 minutes per week; -Counseled on diet and exercise extensively Recommended repeat lipid  panel with next CPE.  Aortic atherosclerosis (Goal: prevent heart events) -Controlled -Current treatment  Rosuvastatin 10 mg 1 tablet daily - Appropriate, Effective, Safe, Accessible Aspirin 81 mg 1 tablet daily - Appropriate, Effective, Safe, Accessible -Medications previously tried: none  -Recommended to continue current medication   Asthma (Goal: control symptoms) -Controlled -Current treatment  Flovent 44 mcg/act 2 puffs once daily - Appropriate, Effective, Safe, Accessible Albuterol HFA as needed - Appropriate, Effective, Safe, Accessible Montelukast 10 mg 1 tablet at bedtime - Appropriate, Effective, Safe, Accessible -Medications previously tried: none  -Pulmonary function testing: n/a -Patient reports consistent use of maintenance inhaler -Frequency of rescue inhaler use: rare (once in 20 years) -Counseled on Proper inhaler technique; Benefits of consistent maintenance inhaler use -Recommended to continue current medication Counseled on importance of rinsing with water and spitting after each use of Flovent.  GERD (Goal: minimize symptoms) -Controlled -Current treatment  Tums as needed - Appropriate, Effective, Safe, Accessible -Medications previously tried: pantoprazole (not needed) -Counseled on non-pharmacologic management of symptoms such as elevating the head of your bed, avoiding eating 2-3 hours before bed, avoiding triggering foods such as acidic, spicy, or fatty foods, eating smaller meals, and wearing clothes that are loose around the waist Recommended use of Tums for  quick relief  Health Maintenance -Vaccine gaps: shingrix, COVID booster -Current therapy:  Multivitamin 1 tablet daily Liquifilm tears as needed Xalatan 1 drop in both eyes at bedtime -Educated on Cost vs benefit of each product must be carefully weighed by individual consumer -Patient is satisfied with current therapy and denies issues -Recommended to continue current medication  Patient Goals/Self-Care Activities Patient will:  - take medications as prescribed check blood pressure daily, document, and provide at future appointments target a minimum of 150 minutes of moderate intensity exercise weekly  Follow Up Plan: Telephone follow up appointment with care management team member scheduled for: 1 year       Medication Assistance: None required.  Patient affirms current coverage meets needs.  Compliance/Adherence/Medication fill history: Care Gaps: Shingrix, COVID booster Last BP - 132/68 on 08/06/2022  Star-Rating Drugs: Losartan 50 mg - last filled 05/11/2022 90 DS at [facility] verified with Vicente Males Rosuvastatin 10 mg - last filled 05/26/2022 90 DS at [facility]  Patient's preferred pharmacy is:  [facility] [address], [place] [address] Phone: [phone] Fax: [phone]  [facility] [place], [place] - [address] [place] [address] Phone: [phone] Fax: [phone]  Uses pill box? Yes - weekly Pt endorses 100% compliance -Rarely misses medications - maybe once a month  We discussed: Current pharmacy is preferred with insurance plan and patient is satisfied with pharmacy services Patient decided to: Continue current medication management strategy  Care Plan and Follow Up Patient Decision:  Patient agrees to Care Plan and Follow-up.  Plan: Telephone follow up appointment  with care management team member scheduled for:  1 year  Jeni Salles, PharmD, [place] Pharmacist [employer] at [place] [phone]

## 2022-08-30 NOTE — Patient Instructions (Signed)
Hi Ed,  It was great to get to meet you in person! I would try to use up all of your medications in [place] and then request them once you run out. That way you can do a reset each year.  Please reach out to me if you have any questions or need anything before our follow up!  Best, Maddie  Jeni Salles, PharmD, [place] [facility] [place]   Visit Information   Goals Addressed   None    Patient Care Plan: CCM Pharmacy Care Plan     Problem Identified: Problem: Hypertension, Hyperlipidemia, GERD, Asthma, and Allergic Rhinitis      Long-Range Goal: Patient-Specific Goal   Start Date: 06/11/2021  Expected End Date: 06/11/2022  Recent Progress: On track  Priority: High  Note:   Current Barriers:  Unable to maintain control of blood pressure Suboptimal therapeutic regimen for cholesterol  Pharmacist Clinical Goal(s):  Patient will maintain control of blood pressure as evidenced by home and office readings  adhere to plan to optimize therapeutic regimen for cholesterol as evidenced by report of adherence to recommended medication management changes through collaboration with PharmD and provider.   Interventions: 1:1 collaboration with [name], [name], MD regarding development and update of comprehensive plan of care as evidenced by provider attestation and co-signature Inter-disciplinary care team collaboration (see longitudinal plan of care) Comprehensive medication review performed; medication list updated in electronic medical record  Hypertension (BP goal <140/90) -Controlled -Current treatment: Losartan 50 mg 1 tablet daily - Appropriate, Effective, Safe, Accessible Amlodipine 5 mg 1 tablet daily - Appropriate, Effective, Safe, Accessible -Medications previously tried: amlodipine (blurry vision), lisinopril-HCTZ   -Current home readings:  lowest 116; usually 130-145/65-74; checking 2-3 times a week and has brought arm cuff  into office before -Current dietary habits: eating out more often; not limiting salt much; has switched to less salty foods; does not read package labels -Current exercise habits: more active with home repairs -Denies hypotensive/hypertensive symptoms -Educated on BP goals and benefits of medications for prevention of heart attack, stroke and kidney damage; Exercise goal of 150 minutes per week; Importance of home blood pressure monitoring; Proper BP monitoring technique; -Counseled to monitor BP at home daily, document, and provide log at future appointments -Counseled on diet and exercise extensively Recommended to continue current medication  Hyperlipidemia: (LDL goal < 70) -Uncontrolled -Current treatment: Rosuvastatin 10 mg 1 tablet daily - Appropriate, Query effective, Safe, Accessible -Medications previously tried: fish oil (not needed) -Current dietary patterns: limiting fried foods -Current exercise habits: active around the house -Educated on Cholesterol goals;  Benefits of statin for ASCVD risk reduction; Importance of limiting foods high in cholesterol; Exercise goal of 150 minutes per week; -Counseled on diet and exercise extensively Recommended repeat lipid panel with next CPE.  Aortic atherosclerosis (Goal: prevent heart events) -Controlled -Current treatment  Rosuvastatin 10 mg 1 tablet daily - Appropriate, Effective, Safe, Accessible Aspirin 81 mg 1 tablet daily - Appropriate, Effective, Safe, Accessible -Medications previously tried: none  -Recommended to continue current medication   Asthma (Goal: control symptoms) -Controlled -Current treatment  Flovent 44 mcg/act 2 puffs once daily - Appropriate, Effective, Safe, Accessible Albuterol HFA as needed - Appropriate, Effective, Safe, Accessible Montelukast 10 mg 1 tablet at bedtime - Appropriate, Effective, Safe, Accessible -Medications previously tried: none  -Pulmonary function testing: n/a -Patient  reports consistent use of maintenance inhaler -Frequency of rescue inhaler use: rare (once in 20 years) -Counseled on Proper inhaler technique; Benefits  of consistent maintenance inhaler use -Recommended to continue current medication Counseled on importance of rinsing with water and spitting after each use of Flovent.  GERD (Goal: minimize symptoms) -Controlled -Current treatment  Tums as needed - Appropriate, Effective, Safe, Accessible -Medications previously tried: pantoprazole (not needed) -Counseled on non-pharmacologic management of symptoms such as elevating the head of your bed, avoiding eating 2-3 hours before bed, avoiding triggering foods such as acidic, spicy, or fatty foods, eating smaller meals, and wearing clothes that are loose around the waist Recommended use of Tums for quick relief  Health Maintenance -Vaccine gaps: shingrix, COVID booster -Current therapy:  Multivitamin 1 tablet daily Liquifilm tears as needed Xalatan 1 drop in both eyes at bedtime -Educated on Cost vs benefit of each product must be carefully weighed by individual consumer -Patient is satisfied with current therapy and denies issues -Recommended to continue current medication  Patient Goals/Self-Care Activities Patient will:  - take medications as prescribed check blood pressure daily, document, and provide at future appointments target a minimum of 150 minutes of moderate intensity exercise weekly  Follow Up Plan: Telephone follow up appointment with care management team member scheduled for: 1 year       Patient verbalizes understanding of instructions and care plan provided today and agrees to view in MyChart. Active MyChart status and patient understanding of how to access instructions and care plan via MyChart confirmed with patient.    Telephone follow up appointment with pharmacy team member scheduled for: 1 year  Verner Chol, [facility]

## 2022-09-14 DIAGNOSIS — I10 Essential (primary) hypertension: Secondary | ICD-10-CM

## 2022-09-14 DIAGNOSIS — J45909 Unspecified asthma, uncomplicated: Secondary | ICD-10-CM | POA: Diagnosis not present

## 2022-09-14 DIAGNOSIS — E785 Hyperlipidemia, unspecified: Secondary | ICD-10-CM | POA: Diagnosis not present

## 2022-10-05 ENCOUNTER — Other Ambulatory Visit: Payer: Self-pay | Admitting: Family Medicine

## 2022-10-05 DIAGNOSIS — J4520 Mild intermittent asthma, uncomplicated: Secondary | ICD-10-CM

## 2022-10-12 DIAGNOSIS — R310 Gross hematuria: Secondary | ICD-10-CM | POA: Diagnosis not present

## 2022-10-12 DIAGNOSIS — N200 Calculus of kidney: Secondary | ICD-10-CM | POA: Diagnosis not present

## 2022-10-17 ENCOUNTER — Other Ambulatory Visit: Payer: Self-pay | Admitting: Family Medicine

## 2022-10-17 DIAGNOSIS — I10 Essential (primary) hypertension: Secondary | ICD-10-CM

## 2022-10-18 ENCOUNTER — Telehealth: Payer: Self-pay | Admitting: Family Medicine

## 2022-10-18 NOTE — Telephone Encounter (Signed)
Refill montelukast (SINGULAIR) 10 MG tablet amLODipine (NORVASC) 5 MG tablet   [facility]) [facility] - [place], [place] - [address] AT [address] Phone: [phone]  Fax: [phone]

## 2022-10-18 NOTE — Telephone Encounter (Signed)
These were sent in this morning. °

## 2022-10-22 NOTE — Progress Notes (Unsigned)
HPI: Mr.Janoah L Noy is a 87 y.o. male with PMHx of HTN,primary hyperparathyroidism,GERD,asthma,and thoracic aortic aneurysm here today to follow on some of his chronic medical problems. He was last seen on 08/06/22. States that he recently had his Medicare wellness and there was some confusion about some of his mediations.  HTN: He reports taking amlodipine 5 mg daily and losartan 50 mg daily. Negative for severe/frequent chest pain, dyspnea, palpitation, orthopnea,PND,claudication, focal weakness, or edema. He has been monitoring his blood pressure at home, with readings averaging around 134/76.  Lab Results  Component Value Date   CREATININE 1.09 08/01/2022   BUN 13 08/01/2022   NA 142 08/01/2022   K 4.7 08/06/2022   CL 107 08/01/2022   CO2 25 08/01/2022   Asthma on Flovent 44 mcg 1 puf bid and montelukast 10 mg daily. He reports occasionally skipping the Flovent for a few days at a time and experiencing symptoms after about four days.  He also skips montelukast 10 mg sometimes.  He recently experienced sneezing and coughing, which resolved after a day or two. Negative for wheezing or DOE. Occasional nasal congestion and rhinorrhea, not severe. Inquiring about OTC allergic medication he can take if needed.  Review of Systems  Constitutional:  Negative for chills and fever.  HENT:  Negative for sinus pressure and sore throat.   Eyes:  Negative for redness and visual disturbance.  Gastrointestinal:  Negative for abdominal pain.  Allergic/Immunologic: Positive for environmental allergies.  Neurological:  Negative for syncope, weakness and headaches.  See other pertinent positives and negatives in HPI.  Current Outpatient Medications on File Prior to Visit  Medication Sig Dispense Refill   amLODipine (NORVASC) 5 MG tablet TAKE 1 TABLET BY MOUTH AT BEDTIME 90 tablet 1   aspirin 81 MG EC tablet Take 81 mg by mouth every morning.     calcium carbonate (TUMS - DOSED IN MG  ELEMENTAL CALCIUM) 500 MG chewable tablet Chew 1 tablet by mouth daily as needed for indigestion or heartburn. Reported on 11/19/2015     fluticasone (FLOVENT HFA) 44 MCG/ACT inhaler INHALE 1 PUFF INTO THE LUNGS TWICE DAILY 21.2 g 2   montelukast (SINGULAIR) 10 MG tablet TAKE 1 TABLET BY MOUTH EVERY NIGHT AT BEDTIME. GENERIC EQUIVALENT FOR SINGULAIR 90 tablet 1   Multiple Vitamin (MULTIVITAMIN) tablet Take 1 tablet by mouth every morning.     polyvinyl alcohol (LIQUIFILM TEARS) 1.4 % ophthalmic solution Place 1 drop into both eyes daily as needed for dry eyes. Reported on 11/19/2015     rosuvastatin (CRESTOR) 10 MG tablet TAKE 1 TABLET BY MOUTH DAILY 90 tablet 3   Travoprost, BAK Free, (TRAVATAN) 0.004 % SOLN ophthalmic solution Place 1 drop into both eyes at bedtime.     No current facility-administered medications on file prior to visit.   Past Medical History:  Diagnosis Date   Allergy    SEASONAL   Arthritis    Asthma    BPH (benign prostatic hypertrophy)    CHF (congestive heart failure) (HCC)    GERD (gastroesophageal reflux disease)    Heart murmur    History of kidney stones    History of myasthenia gravis    15 yrs ago - no current problem or treatment   Hypertension    Mild carotid artery disease (HCC)    bilateral ICA 0-39%  per duplex 02-16-2013   Multiple pulmonary nodules    per last CT stable   Nephrolithiasis    BILATERAL  Thoracic aortic aneurysm (TAA) (HCC)    Allergies  Allergen Reactions   Lisinopril Swelling   Social History   Socioeconomic History   Marital status: Married    Spouse name: Not on file   Number of children: Not on file   Years of education: Not on file   Highest education level: Some college, no degree  Occupational History   Not on file  Tobacco Use   Smoking status: Never   Smokeless tobacco: Never  Vaping Use   Vaping Use: Never used  Substance and Sexual Activity   Alcohol use: No    Alcohol/week: 0.0 standard drinks of  alcohol   Drug use: No   Sexual activity: Not Currently  Other Topics Concern   Not on file  Social History Narrative   Not on file   Social Determinants of Health   Financial Resource Strain: Low Risk  (08/30/2022)   Overall Financial Resource Strain (CARDIA)    Difficulty of Paying Living Expenses: Not very hard  Food Insecurity: No Food Insecurity (08/18/2022)   Hunger Vital Sign    Worried About Running Out of Food in the Last Year: Never true    Ran Out of Food in the Last Year: Never true  Transportation Needs: No Transportation Needs (08/18/2022)   PRAPARE - Administrator, Civil Service (Medical): No    Lack of Transportation (Non-Medical): No  Physical Activity: Inactive (08/18/2022)   Exercise Vital Sign    Days of Exercise per Week: 0 days    Minutes of Exercise per Session: 0 min  Stress: No Stress Concern Present (08/18/2022)   [employer] of Occupational Health - Occupational Stress Questionnaire    Feeling of Stress : Not at all  Social Connections: Moderately Isolated (08/18/2022)   Social Connection and Isolation Panel [NHANES]    Frequency of Communication with Friends and Family: More than three times a week    Frequency of Social Gatherings with Friends and Family: More than three times a week    Attends Religious Services: Never    Database administrator or Organizations: No    Attends Banker Meetings: Never    Marital Status: Married   Vitals:   10/25/22 1113  BP: 128/80  Pulse: 71  Resp: 16  Temp: 98.1 F (36.7 C)  SpO2: 99%  Body mass index is 25.31 kg/m.  Physical Exam Vitals and nursing note reviewed.  Constitutional:      General: He is not in acute distress.    Appearance: He is well-developed and well-groomed.  HENT:     Head: Normocephalic and atraumatic.  Eyes:     Conjunctiva/sclera: Conjunctivae normal.  Cardiovascular:     Rate and Rhythm: Normal rate and regular rhythm.     Heart sounds: Murmur  (SEM I/VI RUSB) heard.     Comments: DP pulses palpable. Pulmonary:     Effort: Pulmonary effort is normal. No respiratory distress.     Breath sounds: Normal breath sounds.  Abdominal:     Palpations: Abdomen is soft. There is no mass.     Tenderness: There is no abdominal tenderness.  Musculoskeletal:     Right lower leg: No edema.     Left lower leg: No edema.  Lymphadenopathy:     Cervical: No cervical adenopathy.  Skin:    General: Skin is warm.     Findings: No erythema or rash.  Neurological:     Mental Status: He is alert  and oriented to person, place, and time.     Cranial Nerves: No cranial nerve deficit.     Gait: Gait normal.  Psychiatric:        Mood and Affect: Mood and affect normal.   ASSESSMENT AND PLAN:  Mr.Jordyn was seen today for medication management.  Diagnoses and all orders for this visit:  Mild intermittent asthma without complication -     albuterol (VENTOLIN HFA) 108 (90 Base) MCG/ACT inhaler; Inhale 2 puffs into the lungs every 6 (six) hours as needed for wheezing or shortness of breath.  Essential hypertension -     losartan (COZAAR) 50 MG tablet; Take 1 tablet (50 mg total) by mouth daily.  Allergic rhinitis due to pollen, unspecified seasonality    No orders of the defined types were placed in this encounter.   Essential hypertension BP adequately controlled. Continue Losartan 50 mg, Amlodiine 5 mg daily, and low salt diet. Continue monitoring BP at home. Eye exam is current.  Asthma Problem is well controlled. Continue Flovent 44 mcg 1 puff bid, swish after use. He can take Montelukast 10 mg during spring and fall specially and rest of year prn.  Return if symptoms worsen or fail to improve, for keep next appointment.  Isahia Hollerbach G. [place], MD  [facility]. [place] office.

## 2022-10-25 ENCOUNTER — Ambulatory Visit (INDEPENDENT_AMBULATORY_CARE_PROVIDER_SITE_OTHER): Payer: Medicare Other | Admitting: Family Medicine

## 2022-10-25 ENCOUNTER — Encounter: Payer: Self-pay | Admitting: Family Medicine

## 2022-10-25 VITALS — BP 128/80 | HR 71 | Temp 98.1°F | Resp 16 | Ht 65.0 in | Wt 152.1 lb

## 2022-10-25 DIAGNOSIS — J4520 Mild intermittent asthma, uncomplicated: Secondary | ICD-10-CM | POA: Diagnosis not present

## 2022-10-25 DIAGNOSIS — I10 Essential (primary) hypertension: Secondary | ICD-10-CM | POA: Diagnosis not present

## 2022-10-25 DIAGNOSIS — J301 Allergic rhinitis due to pollen: Secondary | ICD-10-CM | POA: Diagnosis not present

## 2022-10-25 MED ORDER — ALBUTEROL SULFATE HFA 108 (90 BASE) MCG/ACT IN AERS: 2.0000 | INHALATION_SPRAY | Freq: Four times a day (QID) | RESPIRATORY_TRACT | 2 refills | Status: AC | PRN

## 2022-10-25 MED ORDER — LOSARTAN POTASSIUM 50 MG PO TABS: 50.0000 mg | ORAL_TABLET | Freq: Every day | ORAL | 2 refills | Status: DC

## 2022-10-25 NOTE — Assessment & Plan Note (Signed)
Problem is well controlled. Continue Flovent 44 mcg 1 puff bid, swish after use. He can take Montelukast 10 mg during spring and fall specially and rest of year prn.

## 2022-10-25 NOTE — Assessment & Plan Note (Signed)
BP adequately controlled. Continue Losartan 50 mg, Amlodiine 5 mg daily, and low salt diet. Continue monitoring BP at home. Eye exam is current.

## 2022-10-25 NOTE — Patient Instructions (Addendum)
A few things to remember from today's visit:  Mild intermittent asthma without complication - Plan: albuterol (VENTOLIN HFA) 108 (90 Base) MCG/ACT inhaler  Essential hypertension - Plan: losartan (COZAAR) 50 MG tablet  Allergic rhinitis due to pollen, unspecified seasonality  No changes today. Rinse after Flovent use. I see you next year in March or April. If you need refills for medications you take chronically, please call your pharmacy. Do not use My Chart to request refills or for acute issues that need immediate attention. If you send a my chart message, it may take a few days to be addressed, specially if I am not in the office.  Please be sure medication list is accurate. If a new problem present, please set up appointment sooner than planned today.

## 2022-10-26 ENCOUNTER — Other Ambulatory Visit: Payer: Self-pay | Admitting: Family Medicine

## 2022-10-26 DIAGNOSIS — I10 Essential (primary) hypertension: Secondary | ICD-10-CM

## 2022-11-05 ENCOUNTER — Telehealth: Payer: Self-pay | Admitting: Family Medicine

## 2022-11-05 NOTE — Telephone Encounter (Signed)
Patient

## 2022-11-05 NOTE — Telephone Encounter (Signed)
error 

## 2022-11-22 DIAGNOSIS — H5213 Myopia, bilateral: Secondary | ICD-10-CM | POA: Diagnosis not present

## 2022-11-22 DIAGNOSIS — H5212 Myopia, left eye: Secondary | ICD-10-CM | POA: Diagnosis not present

## 2022-12-22 ENCOUNTER — Telehealth: Payer: Self-pay

## 2022-12-22 NOTE — Progress Notes (Signed)
Patient ID: [name], male   DOB: [birth year], 88 y.o.   MRN: [id number]  Care Management & Coordination Services Pharmacy Team  Reason for Encounter: Hypertension  Contacted patient to discuss hypertension disease state. Spoke with patient on 12/22/2022     Current antihypertensive regimen:  Losartan 50 mg 1 tablet daily - Appropriate, Effective, Safe, Accessible Amlodipine 5 mg 1 tablet daily - Appropriate, Effective, Safe, Accessible  Patient verbally confirms he is taking the above medications as directed. Yes  How often are you checking your Blood Pressure? daily  he checks his blood pressure in the afternoon after taking his medication.  Current home BP readings:  140/76 Patient reports he was not currently at home and this was most recent, will try and give me a call with more readings once he is in front of his log book.  arm cuff Salt intake: Patient reports he is not always good about monitoring but is trying to be better about it.  Patient denies any hypertensive symptoms reports he has had a dull ache over one eye that passed fairly quickly   Any readings above 180/100? No   What recent interventions/DTPs have been made by any provider to improve Blood Pressure control since last CPP Visit: Patient reports none  Any recent hospitalizations or ED visits since last visit with CPP? No  What diet changes have been made to improve Blood Pressure Control?  Patient reports he will try to be better about watching the amount of sodium he is consuming.   Adherence Review: Is the patient currently on ACE/ARB medication? Yes Does the patient have >5 day gap between last estimated fill dates? Yes  Star Rating Drugs:  Losartan 50 mg - last filled 10/26/2022 90 DS at [facility]  Rosuvastatin 10 mg - last filled 08/30/2022 90 DS at [facility] Verified  Chart Updates: Recent office visits:  10/25/22 [name], [name], MD - Patient presented for Mild intermittent  asthma without complication and other concerns. No medication changes.   Recent consult visits:  None  Hospital visits:  Medication Reconciliation was completed by comparing discharge summary, patient's EMR and Pharmacy list, and upon discussion with patient.  Patient presented to [facility] on 08/19/22 due to Cytoscopy. Patient was present for 5 hours.  New?Medications Started at [facility] Discharge:?? -started  docusate sodium  Medication Changes at Hospital Discharge: -Changed  oxyCODONE-acetaminophen ASK how to take: ciprofloxacin 500 MG tablet  Medications Discontinued at Hospital Discharge: -Stopped  none  Medications that remain the same after Hospital Discharge:??  -All other medications will remain the same.    Medications: Outpatient Encounter Medications as of 12/22/2022  Medication Sig   albuterol (VENTOLIN HFA) 108 (90 Base) MCG/ACT inhaler Inhale 2 puffs into the lungs every 6 (six) hours as needed for wheezing or shortness of breath.   amLODipine (NORVASC) 5 MG tablet TAKE 1 TABLET BY MOUTH AT BEDTIME   aspirin 81 MG EC tablet Take 81 mg by mouth every morning.   calcium carbonate (TUMS - DOSED IN MG ELEMENTAL CALCIUM) 500 MG chewable tablet Chew 1 tablet by mouth daily as needed for indigestion or heartburn. Reported on 11/19/2015   fluticasone (FLOVENT HFA) 44 MCG/ACT inhaler INHALE 1 PUFF INTO THE LUNGS TWICE DAILY   losartan (COZAAR) 50 MG tablet Take 1 tablet (50 mg total) by mouth daily.   montelukast (SINGULAIR) 10 MG tablet TAKE 1 TABLET BY MOUTH EVERY NIGHT AT BEDTIME. GENERIC EQUIVALENT FOR SINGULAIR   Multiple  Vitamin (MULTIVITAMIN) tablet Take 1 tablet by mouth every morning.   polyvinyl alcohol (LIQUIFILM TEARS) 1.4 % ophthalmic solution Place 1 drop into both eyes daily as needed for dry eyes. Reported on 11/19/2015   rosuvastatin (CRESTOR) 10 MG tablet TAKE 1 TABLET BY MOUTH DAILY   Travoprost, BAK Free, (TRAVATAN) 0.004 % SOLN ophthalmic  solution Place 1 drop into both eyes at bedtime.   No facility-administered encounter medications on file as of 12/22/2022.    Recent Office Vitals: BP Readings from Last 3 Encounters:  10/25/22 128/80  08/30/22 (!) 142/78  08/19/22 (!) 142/75   Pulse Readings from Last 3 Encounters:  10/25/22 71  08/19/22 60  08/18/22 62    Wt Readings from Last 3 Encounters:  10/25/22 152 lb 2 oz (69 kg)  08/18/22 140 lb 8 oz (63.7 kg)  08/13/22 146 lb (66.2 kg)     Kidney Function Lab Results  Component Value Date/Time   CREATININE 1.09 08/01/2022 11:28 PM   CREATININE 1.02 02/02/2022 08:42 AM   CREATININE 1.09 08/01/2020 08:58 AM   GFR 67.56 02/02/2022 08:42 AM   GFRNONAA >60 08/01/2022 11:28 PM   GFRNONAA 63 08/01/2020 08:58 AM   GFRAA 73 08/01/2020 08:58 AM       Latest Ref Rng & Units 08/06/2022    9:08 AM 08/01/2022   11:28 PM 02/02/2022    8:42 AM  BMP  Glucose 70 - 99 mg/dL  138  98   BUN 8 - 23 mg/dL  13  14   Creatinine 0.61 - 1.24 mg/dL  1.09  1.02   Sodium 135 - 145 mmol/L  142  140   Potassium 3.5 - 5.1 mEq/L 4.7  2.9  3.5   Chloride 98 - 111 mmol/L  107  104   CO2 22 - 32 mmol/L  25  28   Calcium 8.9 - 10.3 mg/dL  10.5  10.7        Ned Clines CMA Clinical Pharmacist Assistant [phone]

## 2023-02-23 ENCOUNTER — Other Ambulatory Visit: Payer: Self-pay | Admitting: Surgery

## 2023-02-23 DIAGNOSIS — I7121 Aneurysm of the ascending aorta, without rupture: Secondary | ICD-10-CM

## 2023-03-23 ENCOUNTER — Ambulatory Visit: Payer: Medicare Other | Admitting: Surgery

## 2023-03-23 ENCOUNTER — Encounter: Payer: Self-pay | Admitting: Surgery

## 2023-03-23 ENCOUNTER — Ambulatory Visit
Admission: RE | Admit: 2023-03-23 | Discharge: 2023-03-23 | Disposition: A | Discharge status: Home / Self Care | Payer: Medicare Other | Source: Ambulatory Visit | Attending: Surgery | Admitting: Surgery

## 2023-03-23 VITALS — BP 162/74 | HR 76 | Resp 20 | Ht 65.0 in | Wt 148.0 lb

## 2023-03-23 DIAGNOSIS — I700 Atherosclerosis of aorta: Secondary | ICD-10-CM | POA: Diagnosis not present

## 2023-03-23 DIAGNOSIS — I7121 Aneurysm of the ascending aorta, without rupture: Secondary | ICD-10-CM

## 2023-03-23 DIAGNOSIS — Z8679 Personal history of other diseases of the circulatory system: Secondary | ICD-10-CM | POA: Diagnosis not present

## 2023-03-23 NOTE — Progress Notes (Signed)
HPI:  The patient is an 88 year old gentleman with a history of hypertension who was seen in our office for initial consultation in April 2023 for a 4.3 to 4.4 cm aortic root and a 4.1 cm fusiform ascending aortic aneurysm.  He has had no change in his medical history over the past year.  He denies any chest pain or pressure.  He has had no back pain or shortness of breath.  Current Outpatient Medications  Medication Sig Dispense Refill   albuterol (VENTOLIN HFA) 108 (90 Base) MCG/ACT inhaler Inhale 2 puffs into the lungs every 6 (six) hours as needed for wheezing or shortness of breath. 1 each 2   amLODipine (NORVASC) 5 MG tablet TAKE 1 TABLET BY MOUTH AT BEDTIME 90 tablet 1   aspirin 81 MG EC tablet Take 81 mg by mouth every morning.     calcium carbonate (TUMS - DOSED IN MG ELEMENTAL CALCIUM) 500 MG chewable tablet Chew 1 tablet by mouth daily as needed for indigestion or heartburn. Reported on 11/19/2015     fluticasone (FLOVENT HFA) 44 MCG/ACT inhaler INHALE 1 PUFF INTO THE LUNGS TWICE DAILY 21.2 g 2   losartan (COZAAR) 50 MG tablet Take 1 tablet (50 mg total) by mouth daily. 90 tablet 2   montelukast (SINGULAIR) 10 MG tablet TAKE 1 TABLET BY MOUTH EVERY NIGHT AT BEDTIME. GENERIC EQUIVALENT FOR SINGULAIR 90 tablet 1   Multiple Vitamin (MULTIVITAMIN) tablet Take 1 tablet by mouth every morning.     polyvinyl alcohol (LIQUIFILM TEARS) 1.4 % ophthalmic solution Place 1 drop into both eyes daily as needed for dry eyes. Reported on 11/19/2015     rosuvastatin (CRESTOR) 10 MG tablet TAKE 1 TABLET BY MOUTH DAILY 90 tablet 3   Travoprost, BAK Free, (TRAVATAN) 0.004 % SOLN ophthalmic solution Place 1 drop into both eyes at bedtime.     No current facility-administered medications for this visit.     Physical Exam: BP (!) 162/74 (BP Location: Left Arm, Patient Position: Sitting)   Pulse 76   Resp 20   Ht 5\' 5"  (1.651 m)   Wt 148 lb (67.1 kg)   SpO2 94% Comment: RA  BMI 24.63 kg/m  He  looks well. Cardiac exam shows a regular rate and rhythm with normal heart sounds.  There is no murmur. Lungs are clear. There is no peripheral edema.  Diagnostic Tests:  Narrative & Impression  CLINICAL DATA:  History of thoracic aortic aneurysm. Follow-up exam.   EXAM: CT CHEST WITHOUT CONTRAST   TECHNIQUE: Multidetector CT imaging of the chest was performed following the standard protocol without IV contrast.   RADIATION DOSE REDUCTION: This exam was performed according to the departmental dose-optimization program which includes automated exposure control, adjustment of the mA and/or kV according to patient size and/or use of iterative reconstruction technique.   COMPARISON:  02/15/2022   FINDINGS: Cardiovascular: Stable dilatation of the aortic root which measures 4.4 cm. Unchanged dilated ascending thoracic aorta measuring 4 cm, image 95/4. The proximal arch measures 3.6 cm and the distal arch measures 3.1 cm. The distal descending aorta is stable measuring 2.6 cm.   Aortic atherosclerosis and coronary artery calcifications. No pericardial effusion.   Mediastinum/Nodes: No enlarged mediastinal or axillary lymph nodes. Thyroid gland, trachea, and esophagus demonstrate no significant findings.   Lungs/Pleura: No pleural fluid, airspace consolidation, or pneumothorax. Within the left upper lobe there is a new part solid nodular density which measures 1.5 cm with central solid component measuring  1.1 cm, image 46/3. Additional small scattered solid lung nodules are stable in the interval. The largest of these is in the periphery of the right lower lobe measuring 6 mm, image 98/3.   Upper Abdomen: Multiple liver cysts are again seen. Gallstones. Multiple calcifications in the left renal collecting system are identified measuring up to 1.6 cm. Stones within the upper pole of the right kidney measure up to 3 mm. Aortic atherosclerotic calcifications.    Musculoskeletal: Spondylosis identified within the thoracic spine. No acute or suspicious osseous findings.   IMPRESSION: 1. Stable dilatation of the aortic root and ascending thoracic aorta. Recommend annual imaging followup by CTA or MRA. This recommendation follows 2010 ACCF/AHA/AATS/ACR/ASA/SCA/SCAI/SIR/STS/SVM Guidelines for the Diagnosis and Management of Patients with Thoracic Aortic Disease. Circulation. 2010; 121: Z610-R604. Aortic aneurysm NOS (ICD10-I71.9) 2. New part solid nodular density within the left upper lobe measures 1.5 cm with central solid component measuring 1.1 cm. This is nonspecific and may be inflammatory or infectious in etiology. Neoplasm cannot be excluded. Follow-up non-contrast CT recommended at 3-6 months to confirm persistence. If unchanged, and solid component remains <6 mm, annual CT is recommended until 5 years of stability has been established. If persistent these nodules should be considered highly suspicious if the solid component of the nodule is 6 mm or greater in size and enlarging. This recommendation follows the consensus statement: Guidelines for Management of Incidental Pulmonary Nodules Detected on CT Images: From the Fleischner Society 2017; Radiology 2017; 284:228-243. 3. Gallstones. 4. Bilateral nephrolithiasis. 5.  Aortic Atherosclerosis (ICD10-I70.0).     Electronically Signed   By: Signa Kell M.D.   On: 03/23/2023 11:24      Impression:  This 88 year old gentleman has stable dilation of the aortic root at 4.4 cm and a small fusiform ascending aortic aneurysm measuring 4.0 cm.  The distal descending aorta measures 2.6 cm.  His aorta is well below the surgical threshold of 5.5 cm.  I reviewed the CT images with him and answered his questions.  I stressed the importance of continued good blood pressure control in preventing further enlargement and acute aortic dissection.  I advised him against doing any heavy lifting or  strenuous physical activity that may require a Valsalva maneuver and could suddenly raise his blood pressure to high levels.  His CT scan also showed a new part solid nodular density within the left upper lobe of the lung measuring 1.5 cm with a central solid component measuring 1.1 cm.  It was felt that this could be inflammatory or infectious but neoplasm cannot be excluded.  A follow-up CT scan of the chest was recommended in 3 to 6 months.  Plan:  I will see him back in 4 months with a CT scan of the chest without contrast to follow-up on the new left upper lobe nodule.   I spent 15 minutes performing this established patient evaluation and > 50% of this time was spent face to face counseling and coordinating the care of this patient's aortic aneurysm and evaluation of new left upper lobe pulmonary nodule.    Alleen Borne, MD [facility] [phone]

## 2023-04-14 DIAGNOSIS — N130 Hydronephrosis with ureteropelvic junction obstruction: Secondary | ICD-10-CM | POA: Diagnosis not present

## 2023-04-14 DIAGNOSIS — N200 Calculus of kidney: Secondary | ICD-10-CM | POA: Diagnosis not present

## 2023-04-25 DIAGNOSIS — H401131 Primary open-angle glaucoma, bilateral, mild stage: Secondary | ICD-10-CM | POA: Diagnosis not present

## 2023-05-07 ENCOUNTER — Other Ambulatory Visit: Payer: Self-pay | Admitting: Family Medicine

## 2023-05-07 DIAGNOSIS — I10 Essential (primary) hypertension: Secondary | ICD-10-CM

## 2023-06-07 ENCOUNTER — Other Ambulatory Visit: Payer: Self-pay | Admitting: Family Medicine

## 2023-06-27 ENCOUNTER — Other Ambulatory Visit: Payer: Self-pay | Admitting: Surgery

## 2023-06-27 DIAGNOSIS — I7121 Aneurysm of the ascending aorta, without rupture: Secondary | ICD-10-CM

## 2023-07-19 DIAGNOSIS — H43811 Vitreous degeneration, right eye: Secondary | ICD-10-CM | POA: Diagnosis not present

## 2023-07-19 DIAGNOSIS — H43391 Other vitreous opacities, right eye: Secondary | ICD-10-CM | POA: Diagnosis not present

## 2023-07-19 DIAGNOSIS — H53143 Visual discomfort, bilateral: Secondary | ICD-10-CM | POA: Diagnosis not present

## 2023-07-22 ENCOUNTER — Telehealth: Payer: Self-pay | Admitting: Family Medicine

## 2023-07-22 DIAGNOSIS — J4520 Mild intermittent asthma, uncomplicated: Secondary | ICD-10-CM

## 2023-07-22 MED ORDER — FLUTICASONE PROPIONATE HFA 44 MCG/ACT IN AERO: INHALATION_SPRAY | RESPIRATORY_TRACT | 0 refills | Status: DC

## 2023-07-22 NOTE — Telephone Encounter (Signed)
Prescription Request  07/22/2023  LOV: 10/25/2022  What is the name of the medication or equipment?  fluticasone (FLOVENT HFA) 44 MCG/ACT inhaler  Have you contacted your pharmacy to request a refill? No   Which pharmacy would you like this sent to?  [facility] - [place], [place] - [address] [place] [address] Phone: [phone] Fax: [phone]    Patient notified that their request is being sent to the clinical staff for review and that they should receive a response within 2 business days.   Please advise at Mobile [phone] (mobile)

## 2023-07-22 NOTE — Telephone Encounter (Signed)
Rx sent 

## 2023-07-27 ENCOUNTER — Ambulatory Visit
Admission: RE | Admit: 2023-07-27 | Discharge: 2023-07-27 | Disposition: A | Discharge status: Home / Self Care | Payer: Medicare Other | Source: Ambulatory Visit | Attending: Surgery | Admitting: Surgery

## 2023-07-27 ENCOUNTER — Encounter: Payer: Self-pay | Admitting: Surgery

## 2023-07-27 ENCOUNTER — Ambulatory Visit: Payer: Medicare Other | Admitting: Surgery

## 2023-07-27 VITALS — BP 164/69 | HR 70 | Resp 20 | Ht 65.0 in | Wt 148.0 lb

## 2023-07-27 DIAGNOSIS — R918 Other nonspecific abnormal finding of lung field: Secondary | ICD-10-CM | POA: Diagnosis not present

## 2023-07-27 DIAGNOSIS — I7121 Aneurysm of the ascending aorta, without rupture: Secondary | ICD-10-CM

## 2023-07-27 DIAGNOSIS — I700 Atherosclerosis of aorta: Secondary | ICD-10-CM | POA: Diagnosis not present

## 2023-07-27 DIAGNOSIS — I2510 Atherosclerotic heart disease of native coronary artery without angina pectoris: Secondary | ICD-10-CM | POA: Diagnosis not present

## 2023-07-27 NOTE — Progress Notes (Signed)
HPI:  The patient is an 88 year old gentleman with history of hypertension and a 4.4 cm aortic root and 4.0 cm ascending aortic aneurysm who I last saw on 03/23/2023.  CT scan at that time also showed a new part solid nodular density within the left upper lobe of the lung measuring 1.5 cm with a central solid component measuring 1.1 cm.  He had other bilateral small lung nodules that have been present for some time and were likely benign so it was felt that this was most likely inflammatory or infectious but neoplasm could not be excluded.  He returns today for follow-up scan.  He feels fine and has had no change in his medical history.  Current Outpatient Medications  Medication Sig Dispense Refill   albuterol (VENTOLIN HFA) 108 (90 Base) MCG/ACT inhaler Inhale 2 puffs into the lungs every 6 (six) hours as needed for wheezing or shortness of breath. 1 each 2   amLODipine (NORVASC) 5 MG tablet TAKE 1 TABLET BY MOUTH AT BEDTIME 90 tablet 1   aspirin 81 MG EC tablet Take 81 mg by mouth every morning.     calcium carbonate (TUMS - DOSED IN MG ELEMENTAL CALCIUM) 500 MG chewable tablet Chew 1 tablet by mouth daily as needed for indigestion or heartburn. Reported on 11/19/2015     fluticasone (FLOVENT HFA) 44 MCG/ACT inhaler INHALE 1 PUFF INTO THE LUNGS TWICE DAILY 21.2 g 0   losartan (COZAAR) 50 MG tablet Take 1 tablet (50 mg total) by mouth daily. 90 tablet 2   montelukast (SINGULAIR) 10 MG tablet TAKE 1 TABLET BY MOUTH EVERY NIGHT AT BEDTIME GENERIC EQUIVALENT FOR SINGULAIR 90 tablet 1   Multiple Vitamin (MULTIVITAMIN) tablet Take 1 tablet by mouth every morning.     polyvinyl alcohol (LIQUIFILM TEARS) 1.4 % ophthalmic solution Place 1 drop into both eyes daily as needed for dry eyes. Reported on 11/19/2015     rosuvastatin (CRESTOR) 10 MG tablet TAKE 1 TABLET BY MOUTH DAILY 90 tablet 3   Travoprost, BAK Free, (TRAVATAN) 0.004 % SOLN ophthalmic solution Place 1 drop into both eyes at bedtime.     No  current facility-administered medications for this visit.     Physical Exam: BP (!) 164/69   Pulse 70   Resp 20   Ht 5\' 5"  (1.651 m)   Wt 148 lb (67.1 kg)   PF 100 L/min   BMI 24.63 kg/m  He looks well. Cardiac exam shows a regular rate and rhythm with normal heart sounds.  There is no murmur. There is lungs are clear.  Diagnostic Tests:  Narrative & Impression  CLINICAL DATA:  Evaluate new left upper lobe lung nodule.   EXAM: CT CHEST WITHOUT CONTRAST   TECHNIQUE: Multidetector CT imaging of the chest was performed following the standard protocol without IV contrast.   RADIATION DOSE REDUCTION: This exam was performed according to the departmental dose-optimization program which includes automated exposure control, adjustment of the mA and/or kV according to patient size and/or use of iterative reconstruction technique.   COMPARISON:  03/23/2023   FINDINGS: Cardiovascular: Heart size is normal. Aortic atherosclerosis and coronary artery calcifications. The ascending thoracic aorta measures 4.2 cm, image 65/2. Previously this was measured at 4 cm. No pericardial effusion.   Mediastinum/Nodes: No enlarged mediastinal or axillary lymph nodes. Thyroid gland, trachea, and esophagus demonstrate no significant findings.   Lungs/Pleura: No pleural effusion, airspace consolidation, atelectasis or pneumothorax. There is been interval resolution of the previous part  solid nodule within the left upper lobe compatible with an inflammatory or infectious process.   Scattered small solid lung nodules are again seen within both lungs. These appear unchanged in multiplicity and size compared with the previous study. The largest is in the periphery of the right lower lobe measuring 6 mm, image 92/8.   Upper Abdomen: No acute abnormality. Unchanged appearance of bilateral upper pole kidney cysts. The largest is off the upper pole of left kidney measuring 3.5 cm. No specific  follow-up imaging recommended. Small low-attenuation liver lesions are again noted. Most of these are too small to characterize but likely represent benign cysts   Musculoskeletal: No chest wall mass or suspicious bone lesions identified.   IMPRESSION: 1. Interval resolution of previous part solid nodule within the left upper lobe compatible with an inflammatory or infectious process. 2. Scattered small solid lung nodules are again seen within both lungs. These appear unchanged in multiplicity and size compared with the previous study. The largest is in the periphery of the right lower lobe measuring 6 mm. These of been unchanged since 2015 and are likely benign. 3. Aneurysmal dilatation of the ascending thoracic aorta measures 4.2 cm. Recommend annual imaging followup by CTA or MRA. This recommendation follows 2010 ACCF/AHA/AATS/ACR/ASA/SCA/SCAI/SIR/STS/SVM Guidelines for the Diagnosis and Management of Patients with Thoracic Aortic Disease. Circulation. 2010; 121: U981-X914. Aortic aneurysm NOS (ICD10-I71.9) 4. Coronary artery calcifications. 5.  Aortic Atherosclerosis (ICD10-I70.0).     Electronically Signed   By: Signa Kell M.D.   On: 07/27/2023 12:25      Impression:  The left upper lobe part solid lung nodule has resolved consistent with a inflammatory or infectious process.  The scattered small solid lung nodules in both lungs are unchanged and certainly benign.  The ascending aortic aneurysm was measured at 4.2 cm.  The patient did not receive any contrast for the study so measurement of the aortic root is inaccurate.  I reviewed the CT images with him and answered all of his questions.  Plan:  I will see him back in 1 year with a CT scan of the chest without contrast for aortic surveillance.  I spent 10 minutes performing this established patient evaluation and > 50% of this time was spent face to face counseling and coordinating the care of this patient's lung  nodules and aortic aneurysm.    Alleen Borne, MD [facility] [phone]

## 2023-07-28 ENCOUNTER — Telehealth: Payer: Self-pay | Admitting: Family Medicine

## 2023-07-28 DIAGNOSIS — J4520 Mild intermittent asthma, uncomplicated: Secondary | ICD-10-CM

## 2023-07-28 NOTE — Telephone Encounter (Signed)
Pharmacy is saying the prescription won't be filled because it's being sent in incorrectly fluticasone (FLOVENT HFA) 44 MCG/ACT inhaler. Patient says he is fine with the generic and requests a call to confirm this has been fixed

## 2023-07-29 MED ORDER — FLUTICASONE PROPIONATE HFA 44 MCG/ACT IN AERO
INHALATION_SPRAY | RESPIRATORY_TRACT | 1 refills | Status: DC
Start: 2023-07-29 — End: 2024-08-24

## 2023-07-29 NOTE — Telephone Encounter (Signed)
I left patient a voicemail letting him know that Rx was corrected & re-sent.

## 2023-07-29 NOTE — Addendum Note (Signed)
Addended by: Kathreen Devoid on: 07/29/2023 07:14 AM   Modules accepted: Orders

## 2023-08-09 ENCOUNTER — Other Ambulatory Visit (HOSPITAL_COMMUNITY): Payer: Self-pay

## 2023-08-24 ENCOUNTER — Ambulatory Visit (INDEPENDENT_AMBULATORY_CARE_PROVIDER_SITE_OTHER): Payer: Medicare Other

## 2023-08-24 VITALS — BP 132/62 | HR 79 | Temp 98.0°F | Ht 65.0 in | Wt 152.3 lb

## 2023-08-24 DIAGNOSIS — Z23 Encounter for immunization: Secondary | ICD-10-CM | POA: Diagnosis not present

## 2023-08-24 DIAGNOSIS — Z0000 Encounter for general adult medical examination without abnormal findings: Secondary | ICD-10-CM | POA: Diagnosis not present

## 2023-08-24 NOTE — Progress Notes (Signed)
Medicare  Subjective:   [name] is a 88 y.o. male who presents for Medicare Annual/Subsequent preventive examination.  Visit Complete: In person    Cardiac Risk Factors include: advanced age (>66men, >28 women);male gender;hypertension;dyslipidemia     Objective:    Today's Vitals   08/24/23 1120  BP: 132/62  Pulse: 79  Temp: 98 F (36.7 C)  TempSrc: Oral  SpO2: 99%  Weight: 152 lb 4.8 oz (69.1 kg)  Height: 5\' 5"  (1.651 m)   Body mass index is 25.34 kg/m.     08/24/2023   11:40 AM 08/18/2022    8:31 AM 08/13/2022    9:24 AM 08/01/2022   11:10 PM 08/04/2021   11:40 AM 11/15/2019   12:43 PM 12/13/2017    1:01 PM  Advanced Directives  Does Patient Have a Medical Advance Directive? No No No No No No No  Would patient like information on creating a medical advance directive? No - Patient declined No - Patient declined No - Patient declined  Yes (MAU/Ambulatory/Procedural Areas - Information given)      Current Medications (verified) Outpatient Encounter Medications as of 08/24/2023  Medication Sig   albuterol (VENTOLIN HFA) 108 (90 Base) MCG/ACT inhaler Inhale 2 puffs into the lungs every 6 (six) hours as needed for wheezing or shortness of breath.   amLODipine (NORVASC) 5 MG tablet TAKE 1 TABLET BY MOUTH AT BEDTIME   aspirin 81 MG EC tablet Take 81 mg by mouth every morning.   calcium carbonate (TUMS - DOSED IN MG ELEMENTAL CALCIUM) 500 MG chewable tablet Chew 1 tablet by mouth daily as needed for indigestion or heartburn. Reported on 11/19/2015   fluticasone (FLOVENT HFA) 44 MCG/ACT inhaler INHALE 1 PUFF INTO THE LUNGS TWICE DAILY. Okay to fill with generic.   losartan (COZAAR) 50 MG tablet Take 1 tablet (50 mg total) by mouth daily.   montelukast (SINGULAIR) 10 MG tablet TAKE 1 TABLET BY MOUTH EVERY NIGHT AT BEDTIME GENERIC EQUIVALENT FOR SINGULAIR   Multiple Vitamin (MULTIVITAMIN) tablet Take 1 tablet by mouth every morning.   polyvinyl alcohol (LIQUIFILM TEARS)  1.4 % ophthalmic solution Place 1 drop into both eyes daily as needed for dry eyes. Reported on 11/19/2015   rosuvastatin (CRESTOR) 10 MG tablet TAKE 1 TABLET BY MOUTH DAILY   Travoprost, BAK Free, (TRAVATAN) 0.004 % SOLN ophthalmic solution Place 1 drop into both eyes at bedtime.   No facility-administered encounter medications on file as of 08/24/2023.    Allergies (verified) Lisinopril   History: Past Medical History:  Diagnosis Date   Allergy    SEASONAL   Arthritis    Asthma    BPH (benign prostatic hypertrophy)    CHF (congestive heart failure) (HCC)    GERD (gastroesophageal reflux disease)    Heart murmur    History of kidney stones    History of myasthenia gravis    15 yrs ago - no current problem or treatment   Hypertension    Mild carotid artery disease (HCC)    bilateral ICA 0-39%  per duplex 02-16-2013   Multiple pulmonary nodules    per last CT stable   Nephrolithiasis    BILATERAL   Thoracic aortic aneurysm (TAA) (HCC)    Past Surgical History:  Procedure Laterality Date   CATARACT EXTRACTION W/ INTRAOCULAR LENS  IMPLANT, BILATERAL  2007   COLONOSCOPY     COLONOSCOPY W/ POLYPECTOMY  05-14-2010   CYSTO/  LEFT RETROGRADE PYELOGRAM/  LEFT URETEROSCOPY/  LEFT STENT  PLACEMENT  06-17-2008   CYSTOSCOPY W/ RETROGRADES Bilateral 03/24/2015   Procedure: CYSTOSCOPY WITH RETROGRADE PYELOGRAM;  Surgeon: Ihor Gully, MD;  Location: [facility];  Service: Urology;  Laterality: Bilateral;   CYSTOSCOPY WITH LITHOLAPAXY N/A 08/19/2022   Procedure: CYSTOSCOPY WITH LITHOLAPAXY,FULGERATION OF PROSTATE, BASKET EXTRACTION OF STONE;  Surgeon: Jannifer Hick, MD;  Location: [facility];  Service: Urology;  Laterality: N/A;   CYSTOSCOPY WITH STENT PLACEMENT Right 03/24/2015   Procedure: CYSTOSCOPY WITH STENT PLACEMENT;  Surgeon: Ihor Gully, MD;  Location: [facility];  Service: Urology;  Laterality: Right;   CYSTOSCOPY WITH URETEROSCOPY Right 03/24/2015    Procedure: CYSTOSCOPY WITH URETEROSCOPY;  Surgeon: Ihor Gully, MD;  Location: [facility];  Service: Urology;  Laterality: Right;   CYSTOSCOPY/URETEROSCOPY/HOLMIUM LASER/STENT PLACEMENT Right 08/19/2022   Procedure: CYSTOSCOPY/RETROGRADE/URETEROSCOPY/HOLMIUM LASER/STENT PLACEMENT,URETHRAL DILATION;  Surgeon: Jannifer Hick, MD;  Location: [facility];  Service: Urology;  Laterality: Right;  ONLY NEEDS 75 MIN FOR ALL PROCEDURES   EXTRACORPOREAL SHOCK WAVE LITHOTRIPSY Bilateral left 02-23-2010 //   right 09-28-2010   HOLMIUM LASER APPLICATION Right 03/24/2015   Procedure: HOLMIUM LASER APPLICATION;  Surgeon: Ihor Gully, MD;  Location: [facility];  Service: Urology;  Laterality: Right;   INGUINAL HERNIA REPAIR Bilateral 2002 approx.   NEPHROLITHOTOMY Left 05/30/2015   Procedure: LEFT PERCUTANEOUS NEPHROLITHOTOMY;  Surgeon: Ihor Gully, MD;  Location: [facility];  Service: Urology;  Laterality: Left;   POLYPECTOMY     STONE EXTRACTION WITH BASKET Right 03/24/2015   Procedure: STONE EXTRACTION WITH BASKET;  Surgeon: Ihor Gully, MD;  Location: [facility];  Service: Urology;  Laterality: Right;   Family History  Problem Relation Age of Onset   Heart disease Mother    Hypertension Mother    Kidney disease Mother    Heart disease Father    Hypertension Father    Diabetes Sister    Diabetes Sister    Hypercalcemia Neg Hx    Social History   Socioeconomic History   Marital status: Married    Spouse name: Not on file   Number of children: Not on file   Years of education: Not on file   Highest education level: Some college, no degree  Occupational History   Not on file  Tobacco Use   Smoking status: Never   Smokeless tobacco: Never  Vaping Use   Vaping status: Never Used  Substance and Sexual Activity   Alcohol use: No    Alcohol/week: 0.0 standard drinks of alcohol   Drug use: No   Sexual activity: Not Currently  Other Topics Concern   Not on  file  Social History Narrative   Not on file   Social Determinants of Health   Financial Resource Strain: Low Risk  (08/24/2023)   Overall Financial Resource Strain (CARDIA)    Difficulty of Paying Living Expenses: Not hard at all  Food Insecurity: No Food Insecurity (08/24/2023)   Hunger Vital Sign    Worried About Running Out of Food in the Last Year: Never true    Ran Out of Food in the Last Year: Never true  Transportation Needs: No Transportation Needs (08/24/2023)   PRAPARE - Administrator, Civil Service (Medical): No    Lack of Transportation (Non-Medical): No  Physical Activity: Inactive (08/24/2023)   Exercise Vital Sign    Days of Exercise per Week: 0 days    Minutes of Exercise per Session: 0 min  Stress: No Stress  Concern Present (08/24/2023)   [employer] of Occupational Health - Occupational Stress Questionnaire    Feeling of Stress : Not at all  Social Connections: Moderately Integrated (08/24/2023)   Social Connection and Isolation Panel [NHANES]    Frequency of Communication with Friends and Family: More than three times a week    Frequency of Social Gatherings with Friends and Family: Never    Attends Religious Services: Never    Diplomatic Services operational officer: No    Attends Engineer, structural: More than 4 times per year    Marital Status: Married    Tobacco Counseling Counseling given: Not Answered   Clinical Intake:  Pre-visit preparation completed: Yes  Pain : No/denies pain     BMI - recorded: 25.34 Nutritional Status: BMI 25 -29 Overweight Nutritional Risks: None Diabetes: No  How often do you need to have someone help you when you read instructions, pamphlets, or other written materials from your doctor or pharmacy?: 1 - Never  Interpreter Needed?: No  Information entered by :: Theresa Mulligan LPN   Activities of Daily Living    08/24/2023   11:39 AM  In your present state of health, do you have any  difficulty performing the following activities:  Hearing? 0  Vision? 0  Difficulty concentrating or making decisions? 0  Walking or climbing stairs? 0  Dressing or bathing? 0  Doing errands, shopping? 0  Preparing Food and eating ? N  Using the Toilet? N  In the past six months, have you accidently leaked urine? N  Do you have problems with loss of bowel control? N  Managing your Medications? N  Managing your Finances? N  Housekeeping or managing your Housekeeping? N    Patient Care Team: [place], [name], MD as PCP - General (Family Medicine) Wyline Mood Alben Spittle, MD as PCP - Cardiology (Cardiology) Verner Chol, [facility] (Inactive) as Pharmacist (Pharmacist)  Indicate any recent Medical Services you may have received from other than [facility] providers in the past year (date may be approximate).     Assessment:   This is a routine wellness examination for Carsen.  Hearing/Vision screen Hearing Screening - Comments:: Denies hearing difficulties   Vision Screening - Comments:: Wears rx glasses - up to date with routine eye exams with  Dr Hyacinth Meeker   Goals Addressed               This Visit's Progress     Stay Active (pt-stated)         Depression Screen    08/24/2023   11:25 AM 10/25/2022   11:21 AM 08/18/2022    8:29 AM 08/06/2022    8:42 AM 02/02/2022    7:25 PM 08/04/2021   11:38 AM 08/04/2021   10:56 AM  PHQ 2/9 Scores  PHQ - 2 Score 0 0 0 0 0 0 0  PHQ- 9 Score   0 1       Fall Risk    08/24/2023   11:39 AM 10/25/2022   11:21 AM 10/22/2022    9:55 AM 08/18/2022    8:30 AM 08/06/2022    8:43 AM  Fall Risk   Falls in the past year? 0 0 0 0 0  Number falls in past yr: 0 0  0 0  Injury with Fall? 0 0  0 0  Risk for fall due to : No Fall Risks History of fall(s)  No Fall Risks   Follow up Falls prevention discussed  Falls evaluation completed  Falls prevention discussed     MEDICARE RISK AT HOME:    TIMED UP AND GO:  Was the test performed?  Yes  Length of time to  ambulate 10 feet: 10 sec Gait steady and fast without use of assistive device    Cognitive Function:    04/06/2017    9:12 AM  MMSE - Mini Mental State Exam  Not completed: --        08/24/2023   11:40 AM 08/18/2022    8:31 AM 08/04/2021   11:44 AM  6CIT Screen  What Year? 0 points 0 points 0 points  What month? 0 points 0 points 0 points  What time? 0 points 0 points 0 points  Count back from 20 4 points 0 points 0 points  Months in reverse 0 points 0 points 0 points  Repeat phrase 4 points 0 points 0 points  Total Score 8 points 0 points 0 points    Immunizations Immunization History  Administered Date(s) Administered   Fluad Quad(high Dose 65+) 10/02/2019, 08/01/2020, 08/04/2021, 08/06/2022   Fluad Trivalent(High Dose 65+) 08/24/2023   Influenza Whole 10/14/2004, 08/18/2007, 08/28/2008, 09/08/2009, 08/20/2010   Influenza, High Dose Seasonal PF 08/27/2013, 09/10/2015, 12/06/2016, 08/25/2017, 09/18/2018   Influenza,inj,Quad PF,6+ Mos 08/29/2014   PFIZER(Purple Top)SARS-COV-2 Vaccination 01/19/2020, 02/19/2020, 08/21/2020, 04/19/2021   Pneumococcal Conjugate-13 08/29/2014   Pneumococcal Polysaccharide-23 02/18/2005   Td 11/15/1998, 05/13/2008   Zoster, Live 06/03/2009      Flu Vaccine status: Completed at today's visit  Pneumococcal vaccine status: Up to date  Covid-19 vaccine status: Declined, Education has been provided regarding the importance of this vaccine but patient still declined. Advised may receive this vaccine at local pharmacy or Health Dept.or vaccine clinic. Aware to provide a copy of the vaccination record if obtained from local pharmacy or Health Dept. Verbalized acceptance and understanding.  Qualifies for Shingles Vaccine? Yes   Zostavax completed No   Shingrix Completed?: No.    Education has been provided regarding the importance of this vaccine. Patient has been advised to call insurance company to determine out of pocket expense if they have not  yet received this vaccine. Advised may also receive vaccine at local pharmacy or Health Dept. Verbalized acceptance and understanding.  Screening Tests Health Maintenance  Topic Date Due   Zoster Vaccines- Shingrix (1 of 2) 09/23/1987   COVID-19 Vaccine (5 - 2023-24 season) 07/17/2023   Medicare Annual Wellness (AWV)  08/23/2024   Pneumonia Vaccine 4+ Years old  Completed   INFLUENZA VACCINE  Completed   HPV VACCINES  Aged Out   DTaP/Tdap/Td  Discontinued    Health Maintenance  Health Maintenance Due  Topic Date Due   Zoster Vaccines- Shingrix (1 of 2) 09/23/1987   COVID-19 Vaccine (5 - 2023-24 season) 07/17/2023    Additional Screening:   Vision Screening: Recommended annual ophthalmology exams for early detection of glaucoma and other disorders of the eye. Is the patient up to date with their annual eye exam?  Yes  Who is the provider or what is the name of the office in which the patient attends annual eye exams? Dr Hyacinth Meeker If pt is not established with a provider, would they like to be referred to a provider to establish care? No .   Dental Screening: Recommended annual dental exams for proper oral hygiene  Community Resource Referral / Chronic Care Management:  CRR required this visit?  No   CCM required this visit?  No  Plan:     I have personally reviewed and noted the following in the patient's chart:   Medical and social history Use of alcohol, tobacco or illicit drugs  Current medications and supplements including opioid prescriptions. Patient is not currently taking opioid prescriptions. Functional ability and status Nutritional status Physical activity Advanced directives List of other physicians Hospitalizations, surgeries, and ER visits in previous 12 months Vitals Screenings to include cognitive, depression, and falls Referrals and appointments  In addition, I have reviewed and discussed with patient certain preventive protocols, quality  metrics, and best practice recommendations. A written personalized care plan for preventive services as well as general preventive health recommendations were provided to patient.     Tillie Rung, LPN   [id number]   After Visit Summary: Given  Nurse Notes: None

## 2023-08-24 NOTE — Patient Instructions (Addendum)
[name] , Thank you for taking time to come for your Medicare Wellness Visit. I appreciate your ongoing commitment to your health goals. Please review the following plan we discussed and let me know if I can assist you in the future.   Referrals/Orders/Follow-Ups/Clinician Recommendations:   This is a list of the screening recommended for you and due dates:  Health Maintenance  Topic Date Due   Zoster (Shingles) Vaccine (1 of 2) 09/23/1987   COVID-19 Vaccine (5 - 2023-24 season) 07/17/2023   Medicare Annual Wellness Visit  08/23/2024   Pneumonia Vaccine  Completed   Flu Shot  Completed   HPV Vaccine  Aged Out   DTaP/Tdap/Td vaccine  Discontinued    Advanced directives: (Declined) Advance directive discussed with you today. Even though you declined this today, please call our office should you change your mind, and we can give you the proper paperwork for you to fill out.  Next Medicare Annual Wellness Visit scheduled for next year: Yes

## 2023-10-15 ENCOUNTER — Other Ambulatory Visit: Payer: Self-pay | Admitting: Family Medicine

## 2023-10-15 DIAGNOSIS — I10 Essential (primary) hypertension: Secondary | ICD-10-CM

## 2023-10-18 DIAGNOSIS — H401131 Primary open-angle glaucoma, bilateral, mild stage: Secondary | ICD-10-CM | POA: Diagnosis not present

## 2024-01-16 DIAGNOSIS — H5212 Myopia, left eye: Secondary | ICD-10-CM | POA: Diagnosis not present

## 2024-03-20 DIAGNOSIS — N200 Calculus of kidney: Secondary | ICD-10-CM | POA: Diagnosis not present

## 2024-03-20 DIAGNOSIS — N130 Hydronephrosis with ureteropelvic junction obstruction: Secondary | ICD-10-CM | POA: Diagnosis not present

## 2024-05-11 DIAGNOSIS — H401131 Primary open-angle glaucoma, bilateral, mild stage: Secondary | ICD-10-CM | POA: Diagnosis not present

## 2024-06-21 ENCOUNTER — Other Ambulatory Visit: Payer: Self-pay | Admitting: Surgery

## 2024-06-21 DIAGNOSIS — I7121 Aneurysm of the ascending aorta, without rupture: Secondary | ICD-10-CM

## 2024-07-01 ENCOUNTER — Other Ambulatory Visit: Payer: Self-pay | Admitting: Family Medicine

## 2024-07-11 ENCOUNTER — Ambulatory Visit (HOSPITAL_COMMUNITY)
Admission: RE | Admit: 2024-07-11 | Discharge: 2024-07-11 | Disposition: A | Discharge status: Home / Self Care | Source: Ambulatory Visit | Attending: Surgery | Admitting: Surgery

## 2024-07-11 DIAGNOSIS — K8020 Calculus of gallbladder without cholecystitis without obstruction: Secondary | ICD-10-CM | POA: Diagnosis not present

## 2024-07-11 DIAGNOSIS — I2510 Atherosclerotic heart disease of native coronary artery without angina pectoris: Secondary | ICD-10-CM | POA: Insufficient documentation

## 2024-07-11 DIAGNOSIS — I700 Atherosclerosis of aorta: Secondary | ICD-10-CM | POA: Insufficient documentation

## 2024-07-11 DIAGNOSIS — N200 Calculus of kidney: Secondary | ICD-10-CM | POA: Insufficient documentation

## 2024-07-11 DIAGNOSIS — R918 Other nonspecific abnormal finding of lung field: Secondary | ICD-10-CM | POA: Diagnosis not present

## 2024-07-11 DIAGNOSIS — I7121 Aneurysm of the ascending aorta, without rupture: Secondary | ICD-10-CM | POA: Insufficient documentation

## 2024-07-18 ENCOUNTER — Ambulatory Visit

## 2024-07-18 VITALS — BP 152/92 | HR 75 | Resp 18 | Ht 65.0 in | Wt 148.0 lb

## 2024-07-18 DIAGNOSIS — I10 Essential (primary) hypertension: Secondary | ICD-10-CM | POA: Diagnosis not present

## 2024-07-18 DIAGNOSIS — I7121 Aneurysm of the ascending aorta, without rupture: Secondary | ICD-10-CM

## 2024-07-18 MED ORDER — AMLODIPINE BESYLATE 10 MG PO TABS: 10.0000 mg | ORAL_TABLET | Freq: Every day | ORAL | 0 refills | Status: AC

## 2024-07-18 NOTE — Progress Notes (Signed)
 [address] [place] [address]             [phone]            [name] [id number] [birth year]   History of Present Illness:  [name] is a 89 year old man with medical history of hypertension, heart failure with preserved ejection fraction, asthma, GERD, kidney stones, osteoarthritis, hyperparathyroidism, pulmonary nodules and hyperlipidemia who presents for continued surveillance of ascending thoracic aortic aneurysm. CT scan of chest on 07/11/2024 measured aneurysm at 4.3 cm which is stable in size.  Echocardiogram on 04/2020 showed normal aortic valve structure.   He states that he has been doing well.  His blood pressure is elevated at today's visit.  He reports home readings in the 130s-170s/70s-80s.  He is currently taking amlodipine  5 mg and losartan  50 mg.  He states that he has been on these medications for a long time.  He denies chest pain, headache, fatigue and dizziness.  He exercises by doing yard work.  He does this almost every day and denies heavy lifting.      Current Outpatient Medications on File Prior to Visit  Medication Sig Dispense Refill   albuterol  (VENTOLIN  HFA) 108 (90 Base) MCG/ACT inhaler Inhale 2 puffs into the lungs every 6 (six) hours as needed for wheezing or shortness of breath. 1 each 2   aspirin 81 MG EC tablet Take 81 mg by mouth every morning.     calcium  carbonate (TUMS - DOSED IN MG ELEMENTAL CALCIUM ) 500 MG chewable tablet Chew 1 tablet by mouth daily as needed for indigestion or heartburn. Reported on 11/19/2015     fluticasone  (FLOVENT  HFA) 44 MCG/ACT inhaler INHALE 1 PUFF INTO THE LUNGS TWICE DAILY. Okay to fill with generic. 31.8 g 1   losartan  (COZAAR ) 50 MG tablet TAKE 1 TABLET BY MOUTH DAILY. GENERIC EQUIVALENT FOR COZAAR  90 tablet 3   montelukast  (SINGULAIR ) 10 MG tablet TAKE 1 TABLET BY MOUTH EVERY NIGHT AT BEDTIME GENERIC EQUIVALENT FOR SINGULAIR  90 tablet 3   Multiple Vitamin  (MULTIVITAMIN) tablet Take 1 tablet by mouth every morning.     polyvinyl alcohol (LIQUIFILM TEARS) 1.4 % ophthalmic solution Place 1 drop into both eyes daily as needed for dry eyes. Reported on 11/19/2015     rosuvastatin  (CRESTOR ) 10 MG tablet TAKE 1 TABLET BY MOUTH DAILY 90 tablet 3   Travoprost, BAK Free, (TRAVATAN) 0.004 % SOLN ophthalmic solution Place 1 drop into both eyes at bedtime.     No current facility-administered medications on file prior to visit.     ROS: Review of Systems  Constitutional: Negative.  Negative for malaise/fatigue.  Respiratory: Negative.  Negative for cough, shortness of breath and wheezing.   Cardiovascular: Negative.  Negative for chest pain, palpitations and leg swelling.  Neurological: Negative.  Negative for dizziness and headaches.     BP (!) 152/92 (BP Location: Right Arm)   Pulse 75   Resp 18   Ht 5' 5 (1.651 m)   Wt 148 lb (67.1 kg)   SpO2 95%   BMI 24.63 kg/m   Physical Exam Constitutional:      Appearance: Normal appearance.  HENT:     Head: Normocephalic and atraumatic.  Cardiovascular:     Rate and Rhythm: Normal rate and regular rhythm.     Heart sounds: S1 normal and S2 normal. Murmur heard.  Pulmonary:  Effort: Pulmonary effort is normal.     Breath sounds: Normal breath sounds.  Skin:    General: Skin is warm and dry.  Neurological:     General: No focal deficit present.     Mental Status: He is alert and oriented to person, place, and time.      Imaging: CLINICAL DATA:  Thoracic aortic aneurysm.   EXAM: CT CHEST WITHOUT CONTRAST   TECHNIQUE: Multidetector CT imaging of the chest was performed following the standard protocol without IV contrast.   RADIATION DOSE REDUCTION: This exam was performed according to the departmental dose-optimization program which includes automated exposure control, adjustment of the mA and/or kV according to patient size and/or use of iterative reconstruction technique.    COMPARISON:  July 27, 2023.   FINDINGS: Cardiovascular: 4.3 cm ascending thoracic aortic aneurysm is noted which is not significantly changed compared to prior exam. Aortic atherosclerosis is noted. Normal cardiac size. No pericardial effusion. Mild coronary artery calcifications are noted.   Mediastinum/Nodes: No enlarged mediastinal or axillary lymph nodes. Thyroid  gland, trachea, and esophagus demonstrate no significant findings.   Lungs/Pleura: No pneumothorax or pleural effusion is noted. Minimal bibasilar subsegmental atelectasis or scarring is noted. Stable nodular densities are noted in right middle lobe. With the largest measuring 4 mm. Stable 7 mm nodule noted in right lower lobe. Stable 7 mm nodule seen medially in right lower lobe. Stable left lingular nodules are noted with the largest measuring 6 mm.   Upper Abdomen: Cholelithiasis. Stable hepatic cysts. Right nephrolithiasis is noted.   Musculoskeletal: No chest wall mass or suspicious bone lesions identified.   IMPRESSION: 1. Mild coronary artery calcifications are noted. 2. Stable bilateral pulmonary nodules are noted, the largest measuring 7 mm. Attention to these on follow-up imaging is recommended. 3. Cholelithiasis. 4. Right nephrolithiasis. 5. Grossly stable 4.3 cm ascending thoracic aortic aneurysm. Recommend annual imaging followup by CTA or MRA. This recommendation follows 2010 ACCF/AHA/AATS/ACR/ASA/SCA/SCAI/SIR/STS/SVM Guidelines for the Diagnosis and Management of Patients with Thoracic Aortic Disease. Circulation. 2010; 121: [id number]. Aortic aneurysm NOS (ICD10-I71.9) .   Aortic Atherosclerosis (ICD10-I70.0).     Electronically Signed   By: [name] M.D.   On: 07/11/2024 10:02     A/P: Aneurysm of ascending aorta without rupture (HCC) -4.3 ascending thoracic aortic aneurysm on CT of chest. We discussed the natural history and and risk factors for growth of ascending aortic  aneurysms. Discussed recommendations to minimize the risk of further expansion or dissection including careful blood pressure control, avoidance of contact sports and heavy lifting, attention to lipid management.  We covered the importance of staying never user of tobacco.  The patient does not yet meet surgical criteria of >5.5cm. The patient is aware of signs and symptoms of aortic dissection and when to present to the emergency department   Essential hypertension -Blood pressure is not well controlled at this time.  Increased amlodipine  to 10 mg daily and sent in new prescription at this time.  He is to continue to check his blood pressure daily and record readings.  He has an appointment with his PCP on 08/24/2024.  He is to bring readings to that visit incase more medication adjustments need to be made. Blood pressure needs to be 130s/80s or less for ascending thoracic aortic aneurysm. Do not ever stop blood pressure medications on your own, unless instructed by healthcare professional.  Pulmonary Nodules -Stable at this time, will continue to follow with yearly CT scan for aneurysm surveillance  Kidney Stones -Patient aware and is followed by urologist  Cholelithiasis -No symptoms at this time. If he were to start experiencing right upper abdominal pain, fever, pain after eating fatty/greasy meal, right shoulder pain, etc then he would need to be evaluated    Brian CHRISTELLA Rough, PA-C 07/18/24

## 2024-07-18 NOTE — Patient Instructions (Signed)
-  Increase amlodipine  to 10 mg daily -Continue to keep blood pressure log and bring readings to PCP office visit on 08/24/2024  Risk Modification in those with ascending thoracic aortic aneurysm:   Continue control of blood pressure (prefer BP 130/80 or less)   2. Avoid fluoroquinolone antibiotics (I.e Ciprofloxacin , Avelox, Levofloxacin, Ofloxacin)   3.  Use of statin (to decrease cardiovascular risk)   4.  Exercise and activity limitations is individualized, but in general, contact sports are to be avoided and one should avoid heavy lifting (defined as half of ideal body weight) and exercises involving sustained Valsalva maneuver.   5.  Follow-up in one year with CT chest.  OK to use a non-contrast CT if you have had a recent study for surveillance of the lung nodule.

## 2024-08-01 DIAGNOSIS — H53143 Visual discomfort, bilateral: Secondary | ICD-10-CM | POA: Diagnosis not present

## 2024-08-01 DIAGNOSIS — H401131 Primary open-angle glaucoma, bilateral, mild stage: Secondary | ICD-10-CM | POA: Diagnosis not present

## 2024-08-23 ENCOUNTER — Other Ambulatory Visit: Payer: Self-pay | Admitting: Family Medicine

## 2024-08-23 DIAGNOSIS — J4520 Mild intermittent asthma, uncomplicated: Secondary | ICD-10-CM

## 2024-08-24 ENCOUNTER — Encounter: Payer: Self-pay | Admitting: Family Medicine

## 2024-08-24 ENCOUNTER — Ambulatory Visit: Payer: Medicare Other | Admitting: Family Medicine

## 2024-08-24 VITALS — BP 145/80 | HR 71 | Temp 98.3°F | Resp 16 | Ht 65.0 in | Wt 142.1 lb

## 2024-08-24 DIAGNOSIS — I700 Atherosclerosis of aorta: Secondary | ICD-10-CM

## 2024-08-24 DIAGNOSIS — J4520 Mild intermittent asthma, uncomplicated: Secondary | ICD-10-CM | POA: Diagnosis not present

## 2024-08-24 DIAGNOSIS — E559 Vitamin D deficiency, unspecified: Secondary | ICD-10-CM | POA: Diagnosis not present

## 2024-08-24 DIAGNOSIS — Z0000 Encounter for general adult medical examination without abnormal findings: Secondary | ICD-10-CM | POA: Diagnosis not present

## 2024-08-24 DIAGNOSIS — I10 Essential (primary) hypertension: Secondary | ICD-10-CM

## 2024-08-24 DIAGNOSIS — Z23 Encounter for immunization: Secondary | ICD-10-CM | POA: Diagnosis not present

## 2024-08-24 LAB — COMPREHENSIVE METABOLIC PANEL WITH GFR
ALT: 20 U/L (ref 0–53)
AST: 31 U/L (ref 0–37)
Albumin: 4.3 g/dL (ref 3.5–5.2)
Alkaline Phosphatase: 77 U/L (ref 39–117)
BUN: 13 mg/dL (ref 6–23)
CO2: 29 meq/L (ref 19–32)
Calcium: 10.3 mg/dL (ref 8.4–10.5)
Chloride: 105 meq/L (ref 96–112)
Creatinine, Ser: 1.03 mg/dL (ref 0.40–1.50)
GFR: 65.58 mL/min (ref >60.00)
Glucose, Bld: 97 mg/dL (ref 70–99)
Potassium: 4.1 meq/L (ref 3.5–5.1)
Sodium: 141 meq/L (ref 135–145)
Total Bilirubin: 0.6 mg/dL (ref 0.2–1.2)
Total Protein: 7.4 g/dL (ref 6.0–8.3)

## 2024-08-24 LAB — LIPID PANEL
Cholesterol: 115 mg/dL (ref 0–200)
HDL: 44.8 mg/dL (ref >39.00)
LDL Cholesterol: 59 mg/dL (ref 0–99)
NonHDL: 70.28
Total CHOL/HDL Ratio: 3
Triglycerides: 58 mg/dL (ref 0.0–149.0)
VLDL: 11.6 mg/dL (ref 0.0–40.0)

## 2024-08-24 LAB — VITAMIN D 25 HYDROXY (VIT D DEFICIENCY, FRACTURES): VITD: 32.3 ng/mL (ref 30.00–100.00)

## 2024-08-24 MED ORDER — FLUTICASONE PROPIONATE HFA 44 MCG/ACT IN AERO
INHALATION_SPRAY | RESPIRATORY_TRACT | 2 refills | Status: AC
Start: 2024-08-24 — End: ?

## 2024-08-24 NOTE — Progress Notes (Signed)
 Chief Complaint  Patient presents with   Annual Exam    Pt reports he is fasted and didn't take his BP med this morning as he was rushing to his appt.    Discussed the use of AI scribe software for clinical note transcription with the patient, who gave verbal consent to proceed.  History of Present Illness RODDY BELLAMY is an 89 year old male with PMHx significant for hypertension, primary hyperparathyroidism,GERD,asthma,and thoracic aortic aneurysm  who presents for a routine CPE. He was last seen on 10/25/22 for follow up. Last CPE 02/02/22. No new problems since his last visit.  He eats mostly at home with daily vegetable intake and does not consume alcohol or smoke.  He engages in physical activity through gardening and reports sleeping 8-9 hours per night. He has not seen a dentist in a while but regularly has eye exams with his eye care provider.   His blood pressure readings at home over the past three to four weeks have been between 138-140 mmHg systolic and 72-76 mmHg diastolic. He is currently taking amlodipine  10 mg and losartan  50 mg daily for hypertension. States that his cardiothoracic surgeon increased the amlodipine  from 5 mg to 10 mg during last follow up visit. He sees provider once a year.  No headaches, chest pain, difficulty breathing, or palpitations.  Echo 04/2020 LVEF 65-70% and grade I diastolic dysfunction  He has not had any falls in the past year.  He is also taking rosuvastatin  10 mg daily for dyslipidemia. He uses Flovent  44 mcg bid and Singulair  10 mg daily for asthma management.   He has a history of myasthenia gravis, which he states that he is in remission and he does not experience weakness or excessive fatigue.  Immunization History  Administered Date(s) Administered   Fluad Quad(high Dose 65+) 10/02/2019, 08/01/2020, 08/04/2021, 08/06/2022   Fluad Trivalent(High Dose 65+) 08/24/2023   INFLUENZA, HIGH DOSE SEASONAL PF 08/27/2013, 09/10/2015,  12/06/2016, 08/25/2017, 09/18/2018, 08/24/2024   Influenza Whole 10/14/2004, 08/18/2007, 08/28/2008, 09/08/2009, 08/20/2010   Influenza,inj,Quad PF,6+ Mos 08/29/2014   PFIZER(Purple Top)SARS-COV-2 Vaccination 01/19/2020, 02/19/2020, 08/21/2020, 04/19/2021   Pfizer Covid-19 Vaccine Bivalent Booster 9yrs & up 09/19/2021   Pfizer(Comirnaty)Fall Seasonal Vaccine 12 years and older 11/18/2022   Pneumococcal Conjugate-13 08/29/2014   Pneumococcal Polysaccharide-23 02/18/2005   Td 11/15/1998, 05/13/2008   Zoster, Live 06/03/2009    Health Maintenance  Topic Date Due   COVID-19 Vaccine (7 - 2025-26 season) 07/16/2024   Medicare Annual Wellness (AWV)  08/23/2024   Zoster Vaccines- Shingrix  (1 of 2) 11/24/2024 (Originally 09/[birth year]/1987)   Pneumococcal Vaccine: 50+ Years  Completed   Influenza Vaccine  Completed   Meningococcal B Vaccine  Aged Out   DTaP/Tdap/Td  Discontinued   Lab Results  Component Value Date   CHOL 167 02/02/2022   HDL 51.80 02/02/2022   LDLCALC 97 02/02/2022   LDLDIRECT 140.4 05/10/2007   TRIG 87.0 02/02/2022   CHOLHDL 3 02/02/2022   Lab Results  Component Value Date   NA 142 08/01/2022   CL 107 08/01/2022   K 4.7 08/06/2022   CO2 25 08/01/2022   BUN 13 08/01/2022   CREATININE 1.09 08/01/2022   GFRNONAA >60 08/01/2022   CALCIUM  10.5 (H) 08/01/2022   PHOS 2.1 (L) 02/02/2022   ALBUMIN 4.5 02/02/2022   GLUCOSE 138 (H) 08/01/2022   Vit D deficiency: He takes a daily multivitamin. Lab Results  Component Value Date   VD25OH 39.56 02/02/2022    Follows with  urologist annually. No changes in urinary frequency or urgency.  Review of Systems  Constitutional:  Negative for activity change, appetite change, chills and fever.  HENT:  Negative for sore throat and trouble swallowing.   Eyes:  Negative for redness and visual disturbance.  Respiratory:  Negative for cough, shortness of breath and wheezing.   Cardiovascular:  Negative for chest pain, palpitations and leg  swelling.  Gastrointestinal:  Negative for abdominal pain, blood in stool, nausea and vomiting.  Endocrine: Negative for cold intolerance, heat intolerance, polydipsia, polyphagia and polyuria.  Genitourinary:  Negative for decreased urine volume, dysuria, genital sores and hematuria.  Musculoskeletal:  Negative for gait problem and myalgias.  Skin:  Negative for color change and rash.  Allergic/Immunologic: Positive for environmental allergies.  Neurological:  Negative for syncope, weakness and headaches.  Hematological:  Negative for adenopathy. Does not bruise/bleed easily.  Psychiatric/Behavioral:  Negative for confusion and sleep disturbance. The patient is not nervous/anxious.    Current Outpatient Medications on File Prior to Visit  Medication Sig Dispense Refill   albuterol  (VENTOLIN  HFA) 108 (90 Base) MCG/ACT inhaler Inhale 2 puffs into the lungs every 6 (six) hours as needed for wheezing or shortness of breath. 1 each 2   amLODipine  (NORVASC ) 10 MG tablet Take 1 tablet (10 mg total) by mouth daily. 90 tablet 0   aspirin 81 MG EC tablet Take 81 mg by mouth every morning.     calcium  carbonate (TUMS - DOSED IN MG ELEMENTAL CALCIUM ) 500 MG chewable tablet Chew 1 tablet by mouth daily as needed for indigestion or heartburn. Reported on 11/19/2015     losartan  (COZAAR ) 50 MG tablet TAKE 1 TABLET BY MOUTH DAILY. GENERIC EQUIVALENT FOR COZAAR  90 tablet 3   montelukast  (SINGULAIR ) 10 MG tablet TAKE 1 TABLET BY MOUTH EVERY NIGHT AT BEDTIME GENERIC EQUIVALENT FOR SINGULAIR  90 tablet 3   Multiple Vitamin (MULTIVITAMIN) tablet Take 1 tablet by mouth every morning.     polyvinyl alcohol (LIQUIFILM TEARS) 1.4 % ophthalmic solution Place 1 drop into both eyes daily as needed for dry eyes. Reported on 11/19/2015     rosuvastatin  (CRESTOR ) 10 MG tablet TAKE 1 TABLET BY MOUTH DAILY 90 tablet 3   Travoprost, BAK Free, (TRAVATAN) 0.004 % SOLN ophthalmic solution Place 1 drop into both eyes at bedtime.      No current facility-administered medications on file prior to visit.    Past Medical History:  Diagnosis Date   Allergy    SEASONAL   Arthritis    Asthma    BPH (benign prostatic hypertrophy)    CHF (congestive heart failure) (HCC)    GERD (gastroesophageal reflux disease)    Heart murmur    History of kidney stones    History of myasthenia gravis    15 yrs ago - no current problem or treatment   Hypertension    Mild carotid artery disease    bilateral ICA 0-39%  per duplex 02-16-2013   Multiple pulmonary nodules    per last CT stable   Nephrolithiasis    BILATERAL   Thoracic aortic aneurysm (TAA)     Past Surgical History:  Procedure Laterality Date   CATARACT EXTRACTION W/ INTRAOCULAR LENS  IMPLANT, BILATERAL  2007   COLONOSCOPY     COLONOSCOPY W/ POLYPECTOMY  05-14-2010   CYSTO/  LEFT RETROGRADE PYELOGRAM/  LEFT URETEROSCOPY/  LEFT STENT PLACEMENT  06-17-2008   CYSTOSCOPY W/ RETROGRADES Bilateral 03/24/2015   Procedure: CYSTOSCOPY WITH RETROGRADE PYELOGRAM;  Surgeon:  Mark Ottelin, MD;  Location: [facility];  Service: Urology;  Laterality: Bilateral;   CYSTOSCOPY WITH LITHOLAPAXY N/A 08/19/2022   Procedure: CYSTOSCOPY WITH LITHOLAPAXY,FULGERATION OF PROSTATE, BASKET EXTRACTION OF STONE;  Surgeon: Selma Donnice SAUNDERS, MD;  Location: [facility];  Service: Urology;  Laterality: N/A;   CYSTOSCOPY WITH STENT PLACEMENT Right 03/24/2015   Procedure: CYSTOSCOPY WITH STENT PLACEMENT;  Surgeon: Mark Ottelin, MD;  Location[facility];  Service: Urology;  Laterality: Right;   CYSTOSCOPY WITH URETEROSCOPY Right 03/24/2015   Procedure: CYSTOSCOPY WITH URETEROSCOPY;  Surgeon: Mark Ottelin, MD;  Location[facility];  Service: Urology;  Laterality: Right;   CYSTOSCOPY/URETEROSCOPY/HOLMIUM LASER/STENT PLACEMENT Right 08/19/2022   Procedure: CYSTOSCOPY/RETROGRADE/URETEROSCOPY/HOLMIUM LASER/STENT PLACEMENT,URETHRAL DILATION;  Surgeon: Selma Donnice SAUNDERS, MD;   Location: [facility];  Service: Urology;  Laterality: Right;  ONLY NEEDS 75 MIN FOR ALL PROCEDURES   EXTRACORPOREAL SHOCK WAVE LITHOTRIPSY Bilateral left 02-23-2010 //   right 09-28-2010   HOLMIUM LASER APPLICATION Right 03/24/2015   Procedure: HOLMIUM LASER APPLICATION;  Surgeon: Oneil Rafter, MD;  Location: [facility];  Service: Urology;  Laterality: Right;   INGUINAL HERNIA REPAIR Bilateral 2002 approx.   NEPHROLITHOTOMY Left 05/30/2015   Procedure: LEFT PERCUTANEOUS NEPHROLITHOTOMY;  Surgeon: Mark Ottelin, MD;  Location: [facility];  Service: Urology;  Laterality: Left;   POLYPECTOMY     STONE EXTRACTION WITH BASKET Right 03/24/2015   Procedure: STONE EXTRACTION WITH BASKET;  Surgeon: Mark Ottelin, MD;  Location: [facility];  Service: Urology;  Laterality: Right;    Allergies  Allergen Reactions   Lisinopril  Swelling   Quinolones Other (See Comments)    Ascending thoracic aortic aneurysm, use with caution    Family History  Problem Relation Age of Onset   Heart disease Mother    Hypertension Mother    Kidney disease Mother    Heart disease Father    Hypertension Father    Diabetes Sister    Diabetes Sister    Hypercalcemia Neg Hx     Social History   Socioeconomic History   Marital status: Married    Spouse name: Not on file   Number of children: Not on file   Years of education: Not on file   Highest education level: Some college, no degree  Occupational History   Not on file  Tobacco Use   Smoking status: Never   Smokeless tobacco: Never  Vaping Use   Vaping status: Never Used  Substance and Sexual Activity   Alcohol use: No    Alcohol/week: 0.0 standard drinks of alcohol   Drug use: No   Sexual activity: Not Currently  Other Topics Concern   Not on file  Social History Narrative   Not on file   Social Drivers of Health   Financial Resource Strain: Low Risk  (08/21/2024)   Overall Financial Resource Strain (CARDIA)    Difficulty of  Paying Living Expenses: Not hard at all  Food Insecurity: No Food Insecurity (08/21/2024)   Hunger Vital Sign    Worried About Running Out of Food in the Last Year: Never true    Ran Out of Food in the Last Year: Never true  Transportation Needs: No Transportation Needs (08/21/2024)   PRAPARE - Administrator, Civil Service (Medical): No    Lack of Transportation (Non-Medical): No  Physical Activity: Sufficiently Active (08/21/2024)   Exercise Vital Sign    Days of Exercise per Week: 5 days  Minutes of Exercise per Session: 80 min  Stress: No Stress Concern Present (08/21/2024)   [employer] of Occupational Health - Occupational Stress Questionnaire    Feeling of Stress: Only a little  Social Connections: Moderately Integrated (08/21/2024)   Social Connection and Isolation Panel    Frequency of Communication with Friends and Family: Twice a week    Frequency of Social Gatherings with Friends and Family: Twice a week    Attends Religious Services: More than 4 times per year    Active Member of Clubs or Organizations: No    Attends Banker Meetings: Not on file    Marital Status: Married    Vitals:   08/24/24 1227 08/24/24 1242  BP: (!) 156/80 (!) 145/80  Pulse: 71   Resp: 16   Temp: 98.3 F (36.8 C)   SpO2: 97%    Body mass index is 23.65 kg/m.  Wt Readings from Last 3 Encounters:  08/24/24 142 lb 2 oz (64.5 kg)  07/18/24 148 lb (67.1 kg)  08/24/23 152 lb 4.8 oz (69.1 kg)   Physical Exam Vitals and nursing note reviewed.  Constitutional:      General: He is not in acute distress.    Appearance: He is well-developed.  HENT:     Head: Normocephalic and atraumatic.     Right Ear: Tympanic membrane, ear canal and external ear normal.     Left Ear: Tympanic membrane, ear canal and external ear normal.     Mouth/Throat:     Mouth: Mucous membranes are moist.     Pharynx: Oropharynx is clear.  Eyes:     Conjunctiva/sclera: Conjunctivae  normal.     Pupils: Pupils are equal, round, and reactive to light.  Neck:     Thyroid : No thyroid  mass or thyromegaly.  Cardiovascular:     Rate and Rhythm: Normal rate and regular rhythm.     Pulses:          Dorsalis pedis pulses are 2+ on the right side and 2+ on the left side.     Heart sounds: Murmur (SEM I/VI RUSB) heard.  Pulmonary:     Effort: Pulmonary effort is normal. No respiratory distress.     Breath sounds: Normal breath sounds.  Abdominal:     Palpations: Abdomen is soft. There is no hepatomegaly or mass.     Tenderness: There is no abdominal tenderness.  Musculoskeletal:        General: No tenderness.     Cervical back: Normal range of motion.     Comments: No signs of synovitis.  Lymphadenopathy:     Cervical: No cervical adenopathy.  Skin:    General: Skin is warm.     Findings: No erythema.  Neurological:     Mental Status: He is alert and oriented to person, place, and time.     Cranial Nerves: No cranial nerve deficit.     Sensory: No sensory deficit.     Coordination: Coordination normal.     Gait: Gait normal.     Deep Tendon Reflexes:     Reflex Scores:      Bicep reflexes are 2+ on the right side and 2+ on the left side.      Patellar reflexes are 2+ on the right side and 2+ on the left side.    Comments:  Mini-Cog - 08/24/24 1213    How many words correct? 3    Psychiatric:        Mood  and Affect: Mood and affect normal.   ASSESSMENT AND PLAN:  [name] was seen today for annual exam.  Diagnoses and all orders for this visit: Orders Placed This Encounter  Procedures   Flu vaccine HIGH DOSE PF(Fluzone Trivalent)   Comprehensive metabolic panel with GFR   Lipid panel   VITAMIN D  25 Hydroxy (Vit-D Deficiency, Fractures)   Lab Results  Component Value Date   VD25OH 32.30 08/24/2024   Lab Results  Component Value Date   NA 141 08/24/2024   CL 105 08/24/2024   K 4.1 08/24/2024   CO2 29 08/24/2024   BUN 13 08/24/2024    CREATININE 1.03 08/24/2024   GFR 65.58 08/24/2024   CALCIUM  10.3 08/24/2024   PHOS 2.1 (L) 02/02/2022   ALBUMIN 4.3 08/24/2024   GLUCOSE 97 08/24/2024   Lab Results  Component Value Date   ALT 20 08/24/2024   AST 31 08/24/2024   ALKPHOS 77 08/24/2024   BILITOT 0.6 08/24/2024   Lab Results  Component Value Date   CHOL 115 08/24/2024   HDL 44.80 08/24/2024   LDLCALC 59 08/24/2024   LDLDIRECT 140.4 05/10/2007   TRIG 58.0 08/24/2024   CHOLHDL 3 08/24/2024   Routine general medical examination at a health care facility Assessment & Plan: We discussed the importance of regular physical activity and healthy diet for prevention of chronic illness and/or complications. Preventive guidelines reviewed. Vaccination up to date. Flu vaccine given today. Next CPE in a year.   Mild intermittent asthma without complication Assessment & Plan: Problem is well controlled. Continue Flovent  44 mcg 1 puff bid. Montelukast  10 mg daily.  Orders: -     Fluticasone  Propionate HFA; INHALE 1 PUFF INTO THE LUNGS TWICE DAILY. Okay to fill with generic.  Dispense: 31.8 g; Refill: 2  Essential hypertension Assessment & Plan: BP mildly elevated today, lower BPs at home. Continue Amlodipine  10 mg daily and Losartan  50 mg daily as well as low salt diet. Instructed to let me know about BP readings in 2-3 weeks.  Orders: -     Comprehensive metabolic panel with GFR; Future  Aortic atherosclerosis Assessment & Plan: Currently on Aspirin 81 mg daily and Rosuvastatin  10 mg daily.  Orders: -     Lipid panel; Future  Vitamin D  deficiency, unspecified -     Comprehensive metabolic panel with GFR; Future -     VITAMIN D  25 Hydroxy (Vit-D Deficiency, Fractures); Future  Influenza vaccine needed -     Flu vaccine HIGH DOSE PF(Fluzone Trivalent)  Return in about 6 months (around 02/22/2025) for chronic problems.  Arley Salamone G. [place], MD  [facility]. [place] office.

## 2024-08-24 NOTE — Patient Instructions (Addendum)
 A few things to remember from today's visit:  Encounter for annual physical exam  Influenza vaccine needed - Plan: Flu vaccine HIGH DOSE PF(Fluzone Trivalent)  Mild intermittent asthma without complication - Plan: fluticasone  (FLOVENT  HFA) 44 MCG/ACT inhaler  Routine general medical examination at a health care facility  Essential hypertension - Plan: Comprehensive metabolic panel with GFR  Aortic atherosclerosis - Plan: Lipid panel  Vitamin D  deficiency, unspecified - Plan: Comprehensive metabolic panel with GFR, VITAMIN D  25 Hydroxy (Vit-D Deficiency, Fractures) Please let me know about blood pressure in 2-3 weeks.  If you need refills for medications you take chronically, please call your pharmacy. Do not use My Chart to request refills or for acute issues that need immediate attention. If you send a my chart message, it may take a few days to be addressed, specially if I am not in the office.  Please be sure medication list is accurate. If a new problem present, please set up appointment sooner than planned today.

## 2024-08-25 ENCOUNTER — Ambulatory Visit: Payer: Self-pay | Admitting: Family Medicine

## 2024-08-25 ENCOUNTER — Encounter: Payer: Self-pay | Admitting: Family Medicine

## 2024-08-25 NOTE — Assessment & Plan Note (Signed)
 Currently on Aspirin 81 mg daily and Rosuvastatin  10 mg daily.

## 2024-08-25 NOTE — Assessment & Plan Note (Signed)
 Problem is well controlled. Continue Flovent  44 mcg 1 puff bid. Montelukast  10 mg daily.

## 2024-08-25 NOTE — Assessment & Plan Note (Signed)
 We discussed the importance of regular physical activity and healthy diet for prevention of chronic illness and/or complications. Preventive guidelines reviewed. Vaccination up to date. Flu vaccine given today. Next CPE in a year.

## 2024-08-25 NOTE — Assessment & Plan Note (Signed)
 BP mildly elevated today, lower BPs at home. Continue Amlodipine  10 mg daily and Losartan  50 mg daily as well as low salt diet. Instructed to let me know about BP readings in 2-3 weeks.

## 2024-08-27 ENCOUNTER — Ambulatory Visit: Payer: Medicare Other

## 2024-08-27 VITALS — BP 124/64 | HR 72 | Temp 97.9°F | Ht 65.0 in | Wt 146.8 lb

## 2024-08-27 DIAGNOSIS — Z0000 Encounter for general adult medical examination without abnormal findings: Secondary | ICD-10-CM | POA: Diagnosis not present

## 2024-08-27 NOTE — Patient Instructions (Addendum)
 [name],  Thank you for taking the time for your Medicare Wellness Visit. I appreciate your continued commitment to your health goals. Please review the care plan we discussed, and feel free to reach out if I can assist you further.  Medicare recommends these wellness visits once per year to help you and your care team stay ahead of potential health issues. These visits are designed to focus on prevention, allowing your provider to concentrate on managing your acute and chronic conditions during your regular appointments.  Please note that Annual Wellness Visits do not include a physical exam. Some assessments may be limited, especially if the visit was conducted virtually. If needed, we may recommend a separate in-person follow-up with your provider.  Ongoing Care Seeing your primary care provider every 3 to 6 months helps us  monitor your health and provide consistent, personalized care.   Referrals If a referral was made during today's visit and you haven't received any updates within two weeks, please contact the referred provider directly to check on the status.  Recommended Screenings:  Health Maintenance  Topic Date Due   COVID-19 Vaccine (7 - 2025-26 season) 07/16/2024   Zoster (Shingles) Vaccine (1 of 2) 11/24/2024*   Medicare Annual Wellness Visit  08/27/2025   Pneumococcal Vaccine for age over 62  Completed   Flu Shot  Completed   Meningitis B Vaccine  Aged Out   DTaP/Tdap/Td vaccine  Discontinued  *Topic was postponed. The date shown is not the original due date.       08/27/2024    1:13 PM  Advanced Directives  Does Patient Have a Medical Advance Directive? No  Would patient like information on creating a medical advance directive? No - Patient declined   Advance Care Planning is important because it: Ensures you receive medical care that aligns with your values, goals, and preferences. Provides guidance to your family and loved ones, reducing the emotional  burden of decision-making during critical moments.  Vision: Annual vision screenings are recommended for early detection of glaucoma, cataracts, and diabetic retinopathy. These exams can also reveal signs of chronic conditions such as diabetes and high blood pressure.  Dental: Annual dental screenings help detect early signs of oral cancer, gum disease, and other conditions linked to overall health, including heart disease and diabetes.  Please see the attached documents for additional preventive care recommendations.

## 2024-08-27 NOTE — Progress Notes (Signed)
 Subjective:   [name] is a 89 y.o. who presents for a Medicare Wellness preventive visit.  As a reminder, Annual Wellness Visits don't include a physical exam, and some assessments may be limited, especially if this visit is performed virtually. We may recommend an in-person follow-up visit with your provider if needed.  Visit Complete: In person    Persons Participating in Visit: Patient.  AWV Questionnaire: No: Patient Medicare AWV questionnaire was not completed prior to this visit.  Cardiac Risk Factors include: advanced age (>41men, >53 women);male gender;hypertension     Objective:    Today's Vitals   08/27/24 1252  BP: 124/64  Pulse: 72  Temp: 97.9 F (36.6 C)  TempSrc: Oral  SpO2: 99%  Weight: 146 lb 12.8 oz (66.6 kg)  Height: 5' 5 (1.651 m)   Body mass index is 24.43 kg/m.     08/27/2024    1:13 PM 08/24/2023   11:40 AM 08/18/2022    8:31 AM 08/13/2022    9:24 AM 08/01/2022   11:10 PM 08/04/2021   11:40 AM 11/15/2019   12:43 PM  Advanced Directives  Does Patient Have a Medical Advance Directive? No No No No No No No  Would patient like information on creating a medical advance directive? No - Patient declined No - Patient declined No - Patient declined No - Patient declined  Yes (MAU/Ambulatory/Procedural Areas - Information given)     Current Medications (verified) Outpatient Encounter Medications as of 08/27/2024  Medication Sig   albuterol  (VENTOLIN  HFA) 108 (90 Base) MCG/ACT inhaler Inhale 2 puffs into the lungs every 6 (six) hours as needed for wheezing or shortness of breath.   amLODipine  (NORVASC ) 10 MG tablet Take 1 tablet (10 mg total) by mouth daily.   aspirin 81 MG EC tablet Take 81 mg by mouth every morning.   calcium  carbonate (TUMS - DOSED IN MG ELEMENTAL CALCIUM ) 500 MG chewable tablet Chew 1 tablet by mouth daily as needed for indigestion or heartburn. Reported on 11/19/2015   fluticasone  (FLOVENT  HFA) 44 MCG/ACT inhaler INHALE 1  PUFF INTO THE LUNGS TWICE DAILY. Okay to fill with generic.   losartan  (COZAAR ) 50 MG tablet TAKE 1 TABLET BY MOUTH DAILY. GENERIC EQUIVALENT FOR COZAAR    montelukast  (SINGULAIR ) 10 MG tablet TAKE 1 TABLET BY MOUTH EVERY NIGHT AT BEDTIME GENERIC EQUIVALENT FOR SINGULAIR    Multiple Vitamin (MULTIVITAMIN) tablet Take 1 tablet by mouth every morning.   polyvinyl alcohol (LIQUIFILM TEARS) 1.4 % ophthalmic solution Place 1 drop into both eyes daily as needed for dry eyes. Reported on 11/19/2015   rosuvastatin  (CRESTOR ) 10 MG tablet TAKE 1 TABLET BY MOUTH DAILY   Travoprost, BAK Free, (TRAVATAN) 0.004 % SOLN ophthalmic solution Place 1 drop into both eyes at bedtime.   No facility-administered encounter medications on file as of 08/27/2024.    Allergies (verified) Lisinopril  and Quinolones   History: Past Medical History:  Diagnosis Date   Allergy    SEASONAL   Arthritis    Asthma    BPH (benign prostatic hypertrophy)    CHF (congestive heart failure) (HCC)    GERD (gastroesophageal reflux disease)    Heart murmur    History of kidney stones    History of myasthenia gravis    15 yrs ago - no current problem or treatment   Hypertension    Mild carotid artery disease    bilateral ICA 0-39%  per duplex 02-16-2013   Multiple pulmonary nodules    per  last CT stable   Nephrolithiasis    BILATERAL   Thoracic aortic aneurysm (TAA)    Past Surgical History:  Procedure Laterality Date   CATARACT EXTRACTION W/ INTRAOCULAR LENS  IMPLANT, BILATERAL  2007   COLONOSCOPY     COLONOSCOPY W/ POLYPECTOMY  05-14-2010   CYSTO/  LEFT RETROGRADE PYELOGRAM/  LEFT URETEROSCOPY/  LEFT STENT PLACEMENT  06-17-2008   CYSTOSCOPY W/ RETROGRADES Bilateral 03/24/2015   Procedure: CYSTOSCOPY WITH RETROGRADE PYELOGRAM;  Surgeon: Mark Ottelin, MD;  Location: [facility];  Service: Urology;  Laterality: Bilateral;   CYSTOSCOPY WITH LITHOLAPAXY N/A 08/19/2022   Procedure: CYSTOSCOPY WITH  LITHOLAPAXY,FULGERATION OF PROSTATE, BASKET EXTRACTION OF STONE;  Surgeon: Selma Donnice SAUNDERS, MD;  Location: [facility];  Service: Urology;  Laterality: N/A;   CYSTOSCOPY WITH STENT PLACEMENT Right 03/24/2015   Procedure: CYSTOSCOPY WITH STENT PLACEMENT;  Surgeon: Mark Ottelin, MD;  Location: [facility];  Service: Urology;  Laterality: Right;   CYSTOSCOPY WITH URETEROSCOPY Right 03/24/2015   Procedure: CYSTOSCOPY WITH URETEROSCOPY;  Surgeon: Mark Ottelin, MD;  Location: [facility];  Service: Urology;  Laterality: Right;   CYSTOSCOPY/URETEROSCOPY/HOLMIUM LASER/STENT PLACEMENT Right 08/19/2022   Procedure: CYSTOSCOPY/RETROGRADE/URETEROSCOPY/HOLMIUM LASER/STENT PLACEMENT,URETHRAL DILATION;  Surgeon: Selma Donnice SAUNDERS, MD;  Location: [facility];  Service: Urology;  Laterality: Right;  ONLY NEEDS 75 MIN FOR ALL PROCEDURES   EXTRACORPOREAL SHOCK WAVE LITHOTRIPSY Bilateral left 02-23-2010 //   right 09-28-2010   HOLMIUM LASER APPLICATION Right 03/24/2015   Procedure: HOLMIUM LASER APPLICATION;  Surgeon: Oneil Rafter, MD;  Location: [facility];  Service: Urology;  Laterality: Right;   INGUINAL HERNIA REPAIR Bilateral 2002 approx.   NEPHROLITHOTOMY Left 05/30/2015   Procedure: LEFT PERCUTANEOUS NEPHROLITHOTOMY;  Surgeon: Mark Ottelin, MD;  Location: [facility];  Service: Urology;  Laterality: Left;   POLYPECTOMY     STONE EXTRACTION WITH BASKET Right 03/24/2015   Procedure: STONE EXTRACTION WITH BASKET;  Surgeon: Mark Ottelin, MD;  Location: [facility];  Service: Urology;  Laterality: Right;   Family History  Problem Relation Age of Onset   Heart disease Mother    Hypertension Mother    Kidney disease Mother    Heart disease Father    Hypertension Father    Diabetes Sister    Diabetes Sister    Hypercalcemia Neg Hx    Social History   Socioeconomic History   Marital status: Married    Spouse name: Not on file   Number of children: Not on file   Years of  education: Not on file   Highest education level: Some college, no degree  Occupational History   Not on file  Tobacco Use   Smoking status: Never   Smokeless tobacco: Never  Vaping Use   Vaping status: Never Used  Substance and Sexual Activity   Alcohol use: No    Alcohol/week: 0.0 standard drinks of alcohol   Drug use: No   Sexual activity: Not Currently  Other Topics Concern   Not on file  Social History Narrative   Not on file   Social Drivers of Health   Financial Resource Strain: Low Risk  (08/27/2024)   Overall Financial Resource Strain (CARDIA)    Difficulty of Paying Living Expenses: Not hard at all  Food Insecurity: No Food Insecurity (08/27/2024)   Hunger Vital Sign    Worried About Running Out of Food in the Last Year: Never true    Ran Out of Food in the Last Year: Never  true  Transportation Needs: No Transportation Needs (08/27/2024)   PRAPARE - Administrator, Civil Service (Medical): No    Lack of Transportation (Non-Medical): No  Physical Activity: Inactive (08/27/2024)   Exercise Vital Sign    Days of Exercise per Week: 0 days    Minutes of Exercise per Session: 0 min  Stress: No Stress Concern Present (08/27/2024)   [employer] of Occupational Health - Occupational Stress Questionnaire    Feeling of Stress: Not at all  Social Connections: Moderately Isolated (08/27/2024)   Social Connection and Isolation Panel    Frequency of Communication with Friends and Family: More than three times a week    Frequency of Social Gatherings with Friends and Family: More than three times a week    Attends Religious Services: Never    Database administrator or Organizations: No    Attends Engineer, structural: Never    Marital Status: Married    Tobacco Counseling Counseling given: Not Answered    Clinical Intake:  Pre-visit preparation completed: Yes  Pain : No/denies pain     BMI - recorded: 24.43 Nutritional Status: BMI of  19-24  Normal Nutritional Risks: None Diabetes: No  No results found for: HGBA1C   How often do you need to have someone help you when you read instructions, pamphlets, or other written materials from your doctor or pharmacy?: 1 - Never  Interpreter Needed?: No  Information entered by :: Rojelio Blush LPN   Activities of Daily Living     08/27/2024    1:12 PM  In your present state of health, do you have any difficulty performing the following activities:  Hearing? 0  Vision? 0  Difficulty concentrating or making decisions? 0  Walking or climbing stairs? 0  Dressing or bathing? 0  Doing errands, shopping? 0  Preparing Food and eating ? N  Using the Toilet? N  In the past six months, have you accidently leaked urine? N  Do you have problems with loss of bowel control? N  Managing your Medications? N  Managing your Finances? N  Housekeeping or managing your Housekeeping? N    Patient Care Team: [place], [name], MD as PCP - General (Family Medicine) Alvan Ronal BRAVO, MD (Inactive) as PCP - Cardiology (Cardiology) Liane Sharyne MATSU, [facility] (Inactive) as Pharmacist (Pharmacist)  I have updated your Care Teams any recent Medical Services you may have received from other providers in the past year.     Assessment:   This is a routine wellness examination for [name].  Hearing/Vision screen Hearing Screening - Comments:: Denies hearing difficulties   Vision Screening - Comments:: Wears rx glasses - up to date with routine eye exams with  Dr Alvia   Goals Addressed               This Visit's Progress     Increase physical activity (pt-stated)        Remain active       Depression Screen     08/27/2024   12:56 PM 08/24/2024   11:42 AM 08/24/2023   11:25 AM 10/25/2022   11:21 AM 08/18/2022    8:29 AM 08/06/2022    8:42 AM 02/02/2022    7:25 PM  PHQ 2/9 Scores  PHQ - 2 Score 0 0 0 0 0 0 0  PHQ- 9 Score     0 1     Fall Risk     08/27/2024    1:12  PM  08/24/2024   11:43 AM 08/24/2023   11:39 AM 10/25/2022   11:21 AM 10/22/2022    9:55 AM  Fall Risk   Falls in the past year? 0 0 0 0 0  Number falls in past yr: 0 0 0 0   Injury with Fall? 0 0 0 0   Risk for fall due to : No Fall Risks No Fall Risks No Fall Risks History of fall(s)   Follow up Falls evaluation completed Falls evaluation completed Falls prevention discussed Falls evaluation completed       Data saved with a previous flowsheet row definition    MEDICARE RISK AT HOME:  Medicare Risk at Home Any stairs in or around the home?: Yes If so, are there any without handrails?: No Home free of loose throw rugs in walkways, pet beds, electrical cords, etc?: Yes Adequate lighting in your home to reduce risk of falls?: Yes Life alert?: No Use of a cane, walker or w/c?: No Grab bars in the bathroom?: No Shower chair or bench in shower?: No Elevated toilet seat or a handicapped toilet?: No  TIMED UP AND GO:  Was the test performed?  Yes  Length of time to ambulate 10 feet: 10 sec Gait steady and fast without use of assistive device  Cognitive Function: 6CIT completed    04/06/2017    9:12 AM  MMSE - Mini Mental State Exam  Not completed: --        08/27/2024    1:13 PM 08/24/2023   11:40 AM 08/18/2022    8:31 AM 08/04/2021   11:44 AM  6CIT Screen  What Year? 0 points 0 points 0 points 0 points  What month? 0 points 0 points 0 points 0 points  What time? 0 points 0 points 0 points 0 points  Count back from 20 2 points 4 points 0 points 0 points  Months in reverse 0 points 0 points 0 points 0 points  Repeat phrase 0 points 4 points 0 points 0 points  Total Score 2 points 8 points 0 points 0 points    Immunizations Immunization History  Administered Date(s) Administered   Fluad Quad(high Dose 65+) 10/02/2019, 08/01/2020, 08/04/2021, 08/06/2022   Fluad Trivalent(High Dose 65+) 08/24/2023   INFLUENZA, HIGH DOSE SEASONAL PF 08/27/2013, 09/10/2015, 12/06/2016,  08/25/2017, 09/18/2018, 08/24/2024   Influenza Whole 10/14/2004, 08/18/2007, 08/28/2008, 09/08/2009, 08/20/2010   Influenza,inj,Quad PF,6+ Mos 08/29/2014   PFIZER(Purple Top)SARS-COV-2 Vaccination 01/19/2020, 02/19/2020, 08/21/2020, 04/19/2021   Pfizer Covid-19 Vaccine Bivalent Booster 52yrs & up 09/19/2021   Pfizer(Comirnaty)Fall Seasonal Vaccine 12 years and older 11/18/2022   Pneumococcal Conjugate-13 08/29/2014   Pneumococcal Polysaccharide-23 02/18/2005   Td 11/15/1998, 05/13/2008   Zoster, Live 06/03/2009    Screening Tests Health Maintenance  Topic Date Due   COVID-19 Vaccine (7 - 2025-26 season) 07/16/2024   Zoster Vaccines- Shingrix  (1 of 2) 11/24/2024 (Originally [birth year]1987)   Medicare Annual Wellness (AWV)  08/27/2025   Pneumococcal Vaccine: 50+ Years  Completed   Influenza Vaccine  Completed   Meningococcal B Vaccine  Aged Out   DTaP/Tdap/Td  Discontinued    Health Maintenance Items Addressed:   Additional Screening:  Vision Screening: Recommended annual ophthalmology exams for early detection of glaucoma and other disorders of the eye. Is the patient up to date with their annual eye exam?  Yes  Who is the provider or what is the name of the office in which the patient attends annual eye exams? Dr Alvia  Dental Screening:  Recommended annual dental exams for proper oral hygiene  Community Resource Referral / Chronic Care Management: CRR required this visit?  No   CCM required this visit?  No   Plan:    I have personally reviewed and noted the following in the patient's chart:   Medical and social history Use of alcohol, tobacco or illicit drugs  Current medications and supplements including opioid prescriptions. Patient is not currently taking opioid prescriptions. Functional ability and status Nutritional status Physical activity Advanced directives List of other physicians Hospitalizations, surgeries, and ER visits in previous 12  months Vitals Screenings to include cognitive, depression, and falls Referrals and appointments  In addition, I have reviewed and discussed with patient certain preventive protocols, quality metrics, and best practice recommendations. A written personalized care plan for preventive services as well as general preventive health recommendations were provided to patient.   Rojelio LELON Blush, LPN   [id number]   After Visit Summary: (In Person-Printed) AVS printed and given to the patient  Notes: Nothing significant to report at this time.

## 2024-10-09 ENCOUNTER — Other Ambulatory Visit: Payer: Self-pay | Admitting: Family Medicine

## 2024-10-09 ENCOUNTER — Other Ambulatory Visit: Payer: Self-pay

## 2024-10-09 DIAGNOSIS — I10 Essential (primary) hypertension: Secondary | ICD-10-CM

## 2024-10-30 DIAGNOSIS — H401131 Primary open-angle glaucoma, bilateral, mild stage: Secondary | ICD-10-CM | POA: Diagnosis not present

## 2025-02-22 ENCOUNTER — Ambulatory Visit: Admitting: Family Medicine

## 2025-09-02 ENCOUNTER — Ambulatory Visit
